# Patient Record
Sex: Female | Born: 1960 | Race: White | Hispanic: No | Marital: Single | Smoking: Current every day smoker
Health system: Southern US, Community
[De-identification: ages and names within clinical notes are randomized; demographics above are authoritative.]

## PROBLEM LIST (undated history)

## (undated) DIAGNOSIS — IMO0002 Reserved for concepts with insufficient information to code with codable children: Secondary | ICD-10-CM

## (undated) DIAGNOSIS — K219 Gastro-esophageal reflux disease without esophagitis: Secondary | ICD-10-CM

## (undated) DIAGNOSIS — M549 Dorsalgia, unspecified: Secondary | ICD-10-CM

## (undated) DIAGNOSIS — W5501XA Bitten by cat, initial encounter: Secondary | ICD-10-CM

## (undated) DIAGNOSIS — M25471 Effusion, right ankle: Secondary | ICD-10-CM

## (undated) DIAGNOSIS — M329 Systemic lupus erythematosus, unspecified: Secondary | ICD-10-CM

## (undated) DIAGNOSIS — F419 Anxiety disorder, unspecified: Secondary | ICD-10-CM

## (undated) DIAGNOSIS — N151 Renal and perinephric abscess: Secondary | ICD-10-CM

## (undated) DIAGNOSIS — M7130 Other bursal cyst, unspecified site: Secondary | ICD-10-CM

## (undated) DIAGNOSIS — M069 Rheumatoid arthritis, unspecified: Secondary | ICD-10-CM

## (undated) DIAGNOSIS — M5430 Sciatica, unspecified side: Secondary | ICD-10-CM

## (undated) DIAGNOSIS — IMO0001 Reserved for inherently not codable concepts without codable children: Secondary | ICD-10-CM

## (undated) HISTORY — DX: Dorsalgia, unspecified: M54.9

## (undated) HISTORY — DX: Effusion, right ankle: M25.471

## (undated) HISTORY — PX: OTHER SURGICAL HISTORY: SHX169

## (undated) HISTORY — DX: Bitten by cat, initial encounter: W55.01XA

## (undated) HISTORY — PX: APPENDECTOMY: SHX54

## (undated) HISTORY — PX: HERNIA REPAIR: SHX51

## (undated) HISTORY — DX: Sciatica, unspecified side: M54.30

## (undated) HISTORY — PX: LUMBAR FUSION: SHX111

---

## 2009-02-10 ENCOUNTER — Encounter: Admission: RE | Admit: 2009-02-10 | Discharge: 2009-02-10 | Payer: Self-pay | Admitting: Rheumatology

## 2009-11-27 ENCOUNTER — Ambulatory Visit (HOSPITAL_COMMUNITY)
Admission: RE | Admit: 2009-11-27 | Discharge: 2009-11-27 | Payer: Self-pay | Source: Home / Self Care | Admitting: General Surgery

## 2010-06-03 LAB — CBC
HCT: 40.1 % (ref 36.0–46.0)
Hemoglobin: 13.5 g/dL (ref 12.0–15.0)
MCH: 33.2 pg (ref 26.0–34.0)
MCHC: 33.6 g/dL (ref 30.0–36.0)
MCV: 98.7 fL (ref 78.0–100.0)
Platelets: 369 10*3/uL (ref 150–400)
RBC: 4.07 MIL/uL (ref 3.87–5.11)
RDW: 16.6 % — ABNORMAL HIGH (ref 11.5–15.5)
WBC: 12.5 10*3/uL — ABNORMAL HIGH (ref 4.0–10.5)

## 2010-06-03 LAB — SURGICAL PCR SCREEN
MRSA, PCR: NEGATIVE
Staphylococcus aureus: POSITIVE — AB

## 2010-06-03 LAB — BASIC METABOLIC PANEL
BUN: 13 mg/dL (ref 6–23)
CO2: 27 mEq/L (ref 19–32)
Chloride: 103 mEq/L (ref 96–112)
GFR calc non Af Amer: >60 mL/min (ref >60)
Glucose, Bld: 104 mg/dL — ABNORMAL HIGH (ref 70–99)
Potassium: 4.5 mEq/L (ref 3.5–5.1)
Sodium: 138 mEq/L (ref 135–145)

## 2014-08-20 ENCOUNTER — Inpatient Hospital Stay (HOSPITAL_COMMUNITY)
Admission: EM | Admit: 2014-08-20 | Discharge: 2014-08-29 | DRG: 853 | Disposition: A | Discharge status: Skilled Nursing Facility | Payer: Medicare Other | Attending: Internal Medicine | Admitting: Internal Medicine

## 2014-08-20 ENCOUNTER — Emergency Department (HOSPITAL_COMMUNITY): Payer: Medicare Other

## 2014-08-20 ENCOUNTER — Encounter (HOSPITAL_COMMUNITY): Payer: Self-pay | Admitting: Cardiology

## 2014-08-20 ENCOUNTER — Inpatient Hospital Stay (HOSPITAL_COMMUNITY): Payer: Medicare Other

## 2014-08-20 DIAGNOSIS — R740 Nonspecific elevation of levels of transaminase and lactic acid dehydrogenase [LDH]: Secondary | ICD-10-CM | POA: Diagnosis present

## 2014-08-20 DIAGNOSIS — R7401 Elevation of levels of liver transaminase levels: Secondary | ICD-10-CM

## 2014-08-20 DIAGNOSIS — M329 Systemic lupus erythematosus, unspecified: Secondary | ICD-10-CM | POA: Diagnosis present

## 2014-08-20 DIAGNOSIS — Z7952 Long term (current) use of systemic steroids: Secondary | ICD-10-CM | POA: Diagnosis not present

## 2014-08-20 DIAGNOSIS — F1020 Alcohol dependence, uncomplicated: Secondary | ICD-10-CM | POA: Diagnosis present

## 2014-08-20 DIAGNOSIS — K6812 Psoas muscle abscess: Secondary | ICD-10-CM | POA: Insufficient documentation

## 2014-08-20 DIAGNOSIS — N136 Pyonephrosis: Secondary | ICD-10-CM | POA: Diagnosis present

## 2014-08-20 DIAGNOSIS — B9689 Other specified bacterial agents as the cause of diseases classified elsewhere: Secondary | ICD-10-CM | POA: Diagnosis not present

## 2014-08-20 DIAGNOSIS — E876 Hypokalemia: Secondary | ICD-10-CM | POA: Diagnosis not present

## 2014-08-20 DIAGNOSIS — N151 Renal and perinephric abscess: Secondary | ICD-10-CM

## 2014-08-20 DIAGNOSIS — R1011 Right upper quadrant pain: Secondary | ICD-10-CM

## 2014-08-20 DIAGNOSIS — D649 Anemia, unspecified: Secondary | ICD-10-CM | POA: Diagnosis not present

## 2014-08-20 DIAGNOSIS — F17210 Nicotine dependence, cigarettes, uncomplicated: Secondary | ICD-10-CM | POA: Diagnosis present

## 2014-08-20 DIAGNOSIS — M545 Low back pain: Secondary | ICD-10-CM | POA: Diagnosis not present

## 2014-08-20 DIAGNOSIS — B9561 Methicillin susceptible Staphylococcus aureus infection as the cause of diseases classified elsewhere: Secondary | ICD-10-CM | POA: Diagnosis present

## 2014-08-20 DIAGNOSIS — I340 Nonrheumatic mitral (valve) insufficiency: Secondary | ICD-10-CM | POA: Diagnosis not present

## 2014-08-20 DIAGNOSIS — R0781 Pleurodynia: Secondary | ICD-10-CM | POA: Diagnosis not present

## 2014-08-20 DIAGNOSIS — K700 Alcoholic fatty liver: Secondary | ICD-10-CM | POA: Diagnosis present

## 2014-08-20 DIAGNOSIS — IMO0002 Reserved for concepts with insufficient information to code with codable children: Secondary | ICD-10-CM

## 2014-08-20 DIAGNOSIS — Z79891 Long term (current) use of opiate analgesic: Secondary | ICD-10-CM

## 2014-08-20 DIAGNOSIS — R52 Pain, unspecified: Secondary | ICD-10-CM

## 2014-08-20 DIAGNOSIS — R7881 Bacteremia: Secondary | ICD-10-CM | POA: Diagnosis not present

## 2014-08-20 DIAGNOSIS — A4101 Sepsis due to Methicillin susceptible Staphylococcus aureus: Principal | ICD-10-CM | POA: Insufficient documentation

## 2014-08-20 DIAGNOSIS — Z113 Encounter for screening for infections with a predominantly sexual mode of transmission: Secondary | ICD-10-CM | POA: Insufficient documentation

## 2014-08-20 DIAGNOSIS — R109 Unspecified abdominal pain: Secondary | ICD-10-CM

## 2014-08-20 DIAGNOSIS — L0291 Cutaneous abscess, unspecified: Secondary | ICD-10-CM

## 2014-08-20 DIAGNOSIS — K219 Gastro-esophageal reflux disease without esophagitis: Secondary | ICD-10-CM

## 2014-08-20 DIAGNOSIS — N12 Tubulo-interstitial nephritis, not specified as acute or chronic: Secondary | ICD-10-CM | POA: Diagnosis not present

## 2014-08-20 DIAGNOSIS — M069 Rheumatoid arthritis, unspecified: Secondary | ICD-10-CM

## 2014-08-20 DIAGNOSIS — F1010 Alcohol abuse, uncomplicated: Secondary | ICD-10-CM | POA: Diagnosis not present

## 2014-08-20 DIAGNOSIS — A499 Bacterial infection, unspecified: Secondary | ICD-10-CM | POA: Diagnosis not present

## 2014-08-20 DIAGNOSIS — N1339 Other hydronephrosis: Secondary | ICD-10-CM | POA: Diagnosis not present

## 2014-08-20 DIAGNOSIS — N1330 Unspecified hydronephrosis: Secondary | ICD-10-CM | POA: Diagnosis not present

## 2014-08-20 DIAGNOSIS — Z79899 Other long term (current) drug therapy: Secondary | ICD-10-CM | POA: Diagnosis not present

## 2014-08-20 DIAGNOSIS — R079 Chest pain, unspecified: Secondary | ICD-10-CM | POA: Diagnosis not present

## 2014-08-20 DIAGNOSIS — M549 Dorsalgia, unspecified: Secondary | ICD-10-CM

## 2014-08-20 DIAGNOSIS — F419 Anxiety disorder, unspecified: Secondary | ICD-10-CM | POA: Diagnosis present

## 2014-08-20 DIAGNOSIS — N135 Crossing vessel and stricture of ureter without hydronephrosis: Secondary | ICD-10-CM | POA: Insufficient documentation

## 2014-08-20 HISTORY — DX: Renal and perinephric abscess: N15.1

## 2014-08-20 HISTORY — DX: Systemic lupus erythematosus, unspecified: M32.9

## 2014-08-20 HISTORY — DX: Reserved for inherently not codable concepts without codable children: IMO0001

## 2014-08-20 HISTORY — PX: PERIPHERALLY INSERTED CENTRAL CATHETER INSERTION: SHX2221

## 2014-08-20 HISTORY — DX: Reserved for concepts with insufficient information to code with codable children: IMO0002

## 2014-08-20 HISTORY — DX: Rheumatoid arthritis, unspecified: M06.9

## 2014-08-20 LAB — COMPREHENSIVE METABOLIC PANEL
ALT: 56 U/L — ABNORMAL HIGH (ref 14–54)
AST: 96 U/L — ABNORMAL HIGH (ref 15–41)
Albumin: 2.9 g/dL — ABNORMAL LOW (ref 3.5–5.0)
Alkaline Phosphatase: 246 U/L — ABNORMAL HIGH (ref 38–126)
Anion gap: 11 (ref 5–15)
BUN: 19 mg/dL (ref 6–20)
CO2: 26 mmol/L (ref 22–32)
Calcium: 9 mg/dL (ref 8.9–10.3)
Chloride: 99 mmol/L — ABNORMAL LOW (ref 101–111)
Creatinine, Ser: 0.85 mg/dL (ref 0.44–1.00)
GFR calc Af Amer: >60 mL/min (ref >60)
GFR calc non Af Amer: >60 mL/min (ref >60)
Glucose, Bld: 108 mg/dL — ABNORMAL HIGH (ref 65–99)
Potassium: 3.8 mmol/L (ref 3.5–5.1)
Sodium: 136 mmol/L (ref 135–145)
Total Bilirubin: 0.8 mg/dL (ref 0.3–1.2)
Total Protein: 7.3 g/dL (ref 6.5–8.1)

## 2014-08-20 LAB — CBC WITH DIFFERENTIAL/PLATELET
BASOS ABS: 0 10*3/uL (ref 0.0–0.1)
Basophils Relative: 0 % (ref 0–1)
EOS PCT: 0 % (ref 0–5)
Eosinophils Absolute: 0 10*3/uL (ref 0.0–0.7)
HEMATOCRIT: 38 % (ref 36.0–46.0)
HEMOGLOBIN: 12.8 g/dL (ref 12.0–15.0)
LYMPHS PCT: 8 % — AB (ref 12–46)
Lymphs Abs: 1.3 10*3/uL (ref 0.7–4.0)
MCH: 34.8 pg — AB (ref 26.0–34.0)
MCHC: 33.7 g/dL (ref 30.0–36.0)
MCV: 103.3 fL — ABNORMAL HIGH (ref 78.0–100.0)
MONO ABS: 1 10*3/uL (ref 0.1–1.0)
MONOS PCT: 6 % (ref 3–12)
NEUTROS ABS: 13.9 10*3/uL — AB (ref 1.7–7.7)
NEUTROS PCT: 86 % — AB (ref 43–77)
PLATELETS: 491 10*3/uL — AB (ref 150–400)
RBC: 3.68 MIL/uL — AB (ref 3.87–5.11)
RDW: 13.1 % (ref 11.5–15.5)
WBC: 16.3 10*3/uL — ABNORMAL HIGH (ref 4.0–10.5)

## 2014-08-20 LAB — URINALYSIS, ROUTINE W REFLEX MICROSCOPIC
GLUCOSE, UA: NEGATIVE mg/dL
KETONES UR: NEGATIVE mg/dL
Nitrite: POSITIVE — AB
PH: 6 (ref 5.0–8.0)
PROTEIN: 100 mg/dL — AB
Specific Gravity, Urine: 1.02 (ref 1.005–1.030)
Urobilinogen, UA: 2 mg/dL — ABNORMAL HIGH (ref 0.0–1.0)

## 2014-08-20 LAB — URINE MICROSCOPIC-ADD ON

## 2014-08-20 LAB — LIPASE, BLOOD: Lipase: 16 U/L — ABNORMAL LOW (ref 22–51)

## 2014-08-20 LAB — MRSA PCR SCREENING: MRSA BY PCR: POSITIVE — AB

## 2014-08-20 LAB — PROTIME-INR
INR: 1.08 (ref 0.00–1.49)
Prothrombin Time: 14.2 seconds (ref 11.6–15.2)

## 2014-08-20 LAB — ETHANOL: Alcohol, Ethyl (B): <5 mg/dL (ref <5)

## 2014-08-20 LAB — LACTIC ACID, PLASMA: Lactic Acid, Venous: 0.7 mmol/L (ref 0.5–2.0)

## 2014-08-20 MED ORDER — MUPIROCIN 2 % EX OINT
1.0000 "application " | TOPICAL_OINTMENT | Freq: Two times a day (BID) | CUTANEOUS | Status: DC
  Administered 2014-08-20 – 2014-08-25 (×10): 1 via NASAL
  Filled 2014-08-20 (×3): qty 22

## 2014-08-20 MED ORDER — ACETAMINOPHEN 325 MG PO TABS
650.0000 mg | ORAL_TABLET | Freq: Four times a day (QID) | ORAL | Status: DC | PRN
  Administered 2014-08-20 – 2014-08-21 (×3): 650 mg via ORAL
  Filled 2014-08-20 (×4): qty 2

## 2014-08-20 MED ORDER — SODIUM CHLORIDE 0.9 % IV SOLN
Freq: Once | INTRAVENOUS | Status: AC
  Administered 2014-08-20: 13:00:00 via INTRAVENOUS

## 2014-08-20 MED ORDER — PANTOPRAZOLE SODIUM 40 MG IV SOLR
40.0000 mg | Freq: Once | INTRAVENOUS | Status: AC
  Administered 2014-08-20: 40 mg via INTRAVENOUS
  Filled 2014-08-20: qty 40

## 2014-08-20 MED ORDER — IOHEXOL 300 MG/ML  SOLN: 100.0000 mL | Freq: Once | INTRAMUSCULAR | Status: AC | PRN

## 2014-08-20 MED ORDER — DEXTROSE 5 % IV SOLN: 1.0000 g | Freq: Once | INTRAVENOUS | Status: DC

## 2014-08-20 MED ORDER — DEXTROSE 5 % IV SOLN
1.0000 g | Freq: Once | INTRAVENOUS | Status: AC
  Administered 2014-08-20: 1 g via INTRAVENOUS
  Filled 2014-08-20: qty 10

## 2014-08-20 MED ORDER — SODIUM CHLORIDE 0.9 % IJ SOLN
3.0000 mL | Freq: Two times a day (BID) | INTRAMUSCULAR | Status: DC
Start: 2014-08-20 — End: 2014-08-29
  Administered 2014-08-20 – 2014-08-27 (×10): 3 mL via INTRAVENOUS

## 2014-08-20 MED ORDER — PANTOPRAZOLE SODIUM 40 MG IV SOLR
40.0000 mg | INTRAVENOUS | Status: DC
  Administered 2014-08-20: 40 mg via INTRAVENOUS
  Filled 2014-08-20: qty 40

## 2014-08-20 MED ORDER — FOLIC ACID 5 MG/ML IJ SOLN
1.0000 mg | Freq: Every day | INTRAMUSCULAR | Status: DC
  Administered 2014-08-20 – 2014-08-22 (×3): 1 mg via INTRAVENOUS
  Filled 2014-08-20 (×7): qty 0.2

## 2014-08-20 MED ORDER — SODIUM CHLORIDE 0.9 % IV BOLUS (SEPSIS)
1000.0000 mL | Freq: Once | INTRAVENOUS | Status: AC
  Administered 2014-08-20: 1000 mL via INTRAVENOUS

## 2014-08-20 MED ORDER — FENTANYL CITRATE (PF) 100 MCG/2ML IJ SOLN
INTRAMUSCULAR | Status: AC
  Administered 2014-08-20 (×2): 50 ug
  Filled 2014-08-20: qty 2

## 2014-08-20 MED ORDER — CHLORHEXIDINE GLUCONATE CLOTH 2 % EX PADS
6.0000 | MEDICATED_PAD | Freq: Every day | CUTANEOUS | Status: AC
  Administered 2014-08-21 – 2014-08-25 (×5): 6 via TOPICAL

## 2014-08-20 MED ORDER — MIDAZOLAM HCL 2 MG/2ML IJ SOLN
INTRAMUSCULAR | Status: AC
  Administered 2014-08-20: 1 mg
  Filled 2014-08-20: qty 2

## 2014-08-20 MED ORDER — IOHEXOL 300 MG/ML  SOLN
20.0000 mL | Freq: Once | INTRAMUSCULAR | Status: AC | PRN
  Administered 2014-08-20: 20 mL via INTRAVENOUS

## 2014-08-20 MED ORDER — LIDOCAINE HCL 1 % IJ SOLN
INTRAMUSCULAR | Status: AC
  Filled 2014-08-20: qty 20

## 2014-08-20 MED ORDER — ONDANSETRON HCL 4 MG PO TABS: 4.0000 mg | ORAL_TABLET | Freq: Four times a day (QID) | ORAL | Status: DC | PRN

## 2014-08-20 MED ORDER — ACETAMINOPHEN 325 MG PO TABS
650.0000 mg | ORAL_TABLET | Freq: Once | ORAL | Status: AC
  Administered 2014-08-20: 650 mg via ORAL
  Filled 2014-08-20: qty 2

## 2014-08-20 MED ORDER — SODIUM CHLORIDE 0.9 % IV SOLN
INTRAVENOUS | Status: DC
  Administered 2014-08-20 – 2014-08-28 (×9): via INTRAVENOUS

## 2014-08-20 MED ORDER — THIAMINE HCL 100 MG/ML IJ SOLN
100.0000 mg | Freq: Every day | INTRAMUSCULAR | Status: DC
  Administered 2014-08-20 – 2014-08-22 (×3): 100 mg via INTRAVENOUS
  Filled 2014-08-20 (×3): qty 2

## 2014-08-20 MED ORDER — PREDNISONE 2.5 MG PO TABS
2.5000 mg | ORAL_TABLET | Freq: Two times a day (BID) | ORAL | Status: DC
  Administered 2014-08-22 – 2014-08-29 (×15): 2.5 mg via ORAL
  Filled 2014-08-20 (×3): qty 1
  Filled 2014-08-20: qty 0.5
  Filled 2014-08-20 (×16): qty 1
  Filled 2014-08-20: qty 0.5

## 2014-08-20 MED ORDER — ACETAMINOPHEN 650 MG RE SUPP
650.0000 mg | Freq: Four times a day (QID) | RECTAL | Status: DC | PRN
Start: 2014-08-20 — End: 2014-08-24

## 2014-08-20 MED ORDER — ONDANSETRON HCL 4 MG/2ML IJ SOLN
4.0000 mg | Freq: Four times a day (QID) | INTRAMUSCULAR | Status: DC | PRN
  Administered 2014-08-26: 4 mg via INTRAVENOUS
  Filled 2014-08-20: qty 2

## 2014-08-20 MED ORDER — CEFAZOLIN SODIUM-DEXTROSE 2-3 GM-% IV SOLR
INTRAVENOUS | Status: AC
  Administered 2014-08-20: 2000 mg
  Filled 2014-08-20: qty 50

## 2014-08-20 MED ORDER — ONDANSETRON HCL 4 MG/2ML IJ SOLN
4.0000 mg | Freq: Once | INTRAMUSCULAR | Status: AC
  Administered 2014-08-20: 4 mg via INTRAVENOUS
  Filled 2014-08-20: qty 2

## 2014-08-20 MED ORDER — FOLIC ACID 1 MG PO TABS: 1.0000 mg | ORAL_TABLET | Freq: Every day | ORAL | Status: DC

## 2014-08-20 MED ORDER — FENTANYL CITRATE (PF) 100 MCG/2ML IJ SOLN
INTRAMUSCULAR | Status: AC
  Filled 2014-08-20: qty 2

## 2014-08-20 MED ORDER — HYDROMORPHONE HCL 1 MG/ML IJ SOLN
1.0000 mg | INTRAMUSCULAR | Status: AC | PRN
  Administered 2014-08-20 (×2): 1 mg via INTRAVENOUS
  Filled 2014-08-20 (×2): qty 1

## 2014-08-20 MED ORDER — SODIUM CHLORIDE 0.9 % IV BOLUS (SEPSIS): 1000.0000 mL | Freq: Once | INTRAVENOUS | Status: DC

## 2014-08-20 MED ORDER — ALPRAZOLAM 1 MG PO TABS
1.0000 mg | ORAL_TABLET | Freq: Two times a day (BID) | ORAL | Status: DC
  Administered 2014-08-20 – 2014-08-29 (×18): 1 mg via ORAL
  Filled 2014-08-20 (×2): qty 1
  Filled 2014-08-20 (×2): qty 2
  Filled 2014-08-20 (×6): qty 1
  Filled 2014-08-20: qty 2
  Filled 2014-08-20 (×8): qty 1

## 2014-08-20 MED ORDER — HEPARIN SODIUM (PORCINE) 5000 UNIT/ML IJ SOLN
5000.0000 [IU] | Freq: Three times a day (TID) | INTRAMUSCULAR | Status: DC
  Administered 2014-08-20 – 2014-08-29 (×23): 5000 [IU] via SUBCUTANEOUS
  Filled 2014-08-20 (×26): qty 1

## 2014-08-20 MED ORDER — METHOTREXATE 2.5 MG PO TABS
25.0000 mg | ORAL_TABLET | ORAL | Status: DC
  Filled 2014-08-20: qty 10

## 2014-08-20 MED ORDER — MORPHINE SULFATE 4 MG/ML IJ SOLN
4.0000 mg | INTRAMUSCULAR | Status: DC | PRN
  Administered 2014-08-21 – 2014-08-24 (×17): 4 mg via INTRAVENOUS
  Filled 2014-08-20 (×17): qty 1

## 2014-08-20 MED ORDER — METHOTREXATE 2.5 MG PO TABS: 25.0000 mg | ORAL_TABLET | ORAL | Status: DC

## 2014-08-20 NOTE — ED Notes (Signed)
Lab at bedside obtaining blood cultures.

## 2014-08-20 NOTE — ED Provider Notes (Addendum)
CSN: [id number]     Arrival date & time 08/20/14  1015 History  This chart was scribed for Rolland Porter, MD by Tanda Rockers, ED Scribe. This patient was seen in room APA15/APA15 and the patient's care was started at 11:10 AM.    Chief Complaint  Patient presents with  . Abdominal Pain   The history is provided by the patient. No language interpreter was used.     HPI Comments: Deanna Ball is a 54 y.o. female who presents to the Emergency Department complaining of right sided abdominal pain x 2 days. She also complains of nausea and vomiting. Pt admits to drinking approximately 2 pints of EtOH everyday for "awhile." Pt has not had a drink in the last 5days. The last time she went 24 hours without drinking was "months" ago. She states that she had the abdominal pain prior to quitting drinking.  Fact, she states that this is why she quit drinking.. Denies hematemesis, chest pain, back pain, diarrhea, urinary changes, or any other symptoms. Pt has pshx appendectomy.   No history of known renal disorders. Does have a history of lupus, rheumatoid arthritis. She does take methotrexate 2 times per week. No history of kidney stones.  Has not been febrile. No shakes or chills. No dysuria frequency hematuria. No hematemesis. Normal stools.   Past Medical History  Diagnosis Date  . Lupus   . Rheumatoid arthritis    Past Surgical History  Procedure Laterality Date  . Hernia repair    . Appendectomy     History reviewed. No pertinent family history. History  Substance Use Topics  . Smoking status: Current Every Day Smoker  . Smokeless tobacco: Not on file  . Alcohol Use: Yes     Comment: 1 pint -2 pints daily   OB History    No data available     Review of Systems  Constitutional: Negative for fever, chills, diaphoresis, appetite change and fatigue.  HENT: Negative for mouth sores, sore throat and trouble swallowing.   Eyes: Negative for visual disturbance.  Respiratory: Negative for  cough, chest tightness, shortness of breath and wheezing.   Cardiovascular: Negative for chest pain.  Gastrointestinal: Positive for nausea, vomiting and abdominal pain (Right sided. ). Negative for diarrhea and abdominal distention.  Endocrine: Negative for polydipsia, polyphagia and polyuria.  Genitourinary: Negative for dysuria, frequency and hematuria.  Musculoskeletal: Negative for back pain and gait problem.  Skin: Negative for color change, pallor and rash.  Neurological: Negative for dizziness, syncope, light-headedness and headaches.  Hematological: Does not bruise/bleed easily.  Psychiatric/Behavioral: Negative for behavioral problems and confusion.      Allergies  Review of patient's allergies indicates no known allergies.  Home Medications   Prior to Admission medications   Medication Sig Start Date End Date Taking? Authorizing Provider  ALPRAZolam Prudy Feeler) 1 MG tablet Take 1 mg by mouth 2 (two) times daily.  07/31/14  Yes Historical Provider, MD  HYDROcodone-acetaminophen (NORCO/VICODIN) 5-325 MG per tablet  07/31/14  Yes Historical Provider, MD  methotrexate (RHEUMATREX) 2.5 MG tablet Take 25 mg by mouth 2 (two) times a week. Caution:Chemotherapy. Protect from light.   Yes Historical Provider, MD  naproxen sodium (ANAPROX) 220 MG tablet Take 220 mg by mouth 2 (two) times daily with a meal.   Yes Historical Provider, MD  NEXIUM 40 MG capsule  07/17/14  Yes Historical Provider, MD  predniSONE (DELTASONE) 2.5 MG tablet Take 2.5 mg by mouth 2 (two) times daily with a meal.  Yes Historical Provider, MD  Vitamin D, Ergocalciferol, (DRISDOL) 50000 UNITS CAPS capsule Take 50,000 Units by mouth every 7 (seven) days.   Yes Historical Provider, MD   Triage Vitals: BP 109/87 mmHg  Pulse 102  Temp(Src) 99.4 F (37.4 C) (Oral)  Resp 18  Ht 5' 2.5" (1.588 m)  Wt 155 lb (70.308 kg)  BMI 27.88 kg/m2  SpO2 99%   Physical Exam  Constitutional: She is oriented to person, place, and  time. She appears well-developed and well-nourished. No distress.  HENT:  Head: Normocephalic.  Eyes: Conjunctivae are normal. Pupils are equal, round, and reactive to light. No scleral icterus.  Neck: Normal range of motion. Neck supple. No thyromegaly present.  Cardiovascular: Normal rate and regular rhythm.  Exam reveals no gallop and no friction rub.   No murmur heard. Pulmonary/Chest: Effort normal and breath sounds normal. No respiratory distress. She has no wheezes. She has no rales.  Abdominal: Soft. Bowel sounds are normal. She exhibits no distension. There is tenderness. There is no rebound.  Tenderness right mid abdomen and RUQ.  Liver palpably enlarged.   Musculoskeletal: Normal range of motion.  Neurological: She is alert and oriented to person, place, and time.  Skin: Skin is warm and dry. No rash noted.  Psychiatric: She has a normal mood and affect. Her behavior is normal.    ED Course  Procedures (including critical care time)  DIAGNOSTIC STUDIES: Oxygen Saturation is 99% on RA, normal by my interpretation.    COORDINATION OF CARE: 11:12 AM-Discussed treatment plan which includes IV Fluids, LFTs with pt at bedside and pt agreed to plan.   Labs Review Labs Reviewed  CBC WITH DIFFERENTIAL/PLATELET - Abnormal; Notable for the following:    WBC 16.3 (*)    RBC 3.68 (*)    MCV 103.3 (*)    MCH 34.8 (*)    Platelets 491 (*)    Neutrophils Relative % 86 (*)    Neutro Abs 13.9 (*)    Lymphocytes Relative 8 (*)    All other components within normal limits  COMPREHENSIVE METABOLIC PANEL - Abnormal; Notable for the following:    Chloride 99 (*)    Glucose, Bld 108 (*)    Albumin 2.9 (*)    AST 96 (*)    ALT 56 (*)    Alkaline Phosphatase 246 (*)    All other components within normal limits  LIPASE, BLOOD - Abnormal; Notable for the following:    Lipase 16 (*)    All other components within normal limits  ETHANOL  URINALYSIS, ROUTINE W REFLEX MICROSCOPIC (NOT AT  [facility])    Imaging Review Ct Abdomen Pelvis W Contrast  08/20/2014   CLINICAL DATA:  Right upper quadrant abdominal pain for 1 week with nausea and vomiting. History of appendectomy, hernia repair and systemic lupus erythematosus. Moderate leukocytosis. Initial encounter.  EXAM: CT ABDOMEN AND PELVIS WITH CONTRAST  TECHNIQUE: Multidetector CT imaging of the abdomen and pelvis was performed using the standard protocol following bolus administration of intravenous contrast.  CONTRAST:  100 ml Omnipaque 300  COMPARISON:  Ultrasound same date.  FINDINGS: Minimal dependent atelectasis at both lung bases. Otherwise clear. No pleural or pericardial effusion.  Lower chest: Mildly decreased hepatic density suspicious for steatosis. No focal lesions identified.  Hepatobiliary: The liver is normal in density without focal abnormality. No evidence of gallstones, gallbladder wall thickening or biliary dilatation.  Pancreas: Unremarkable. No pancreatic ductal dilatation or surrounding inflammatory changes.  Spleen: Normal in  size without focal abnormality.  Adrenals/Urinary Tract: Both adrenal glands appear normal.The left kidney appears normal. The right kidney demonstrates hydronephrosis and delayed contrast excretion. There is asymmetric perinephric soft tissue stranding on the right. There is a complex fluid collection or centrally necrotic mass adjacent to the ureteropelvic junction, measuring 5.6 x 3.2 cm transverse on image 46. This extends into the right psoas muscle. No urinary tract calculi demonstrated. The distal ureters and bladder appear unremarkable.  Stomach/Bowel: No evidence of bowel wall thickening, distention or surrounding inflammatory change.Mild sigmoid colon diverticulosis without surrounding inflammation.  Vascular/Lymphatic: There are several mildly prominent retroperitoneal lymph nodes, likely reactive. No mesenteric adenopathy. Mild aortoiliac atherosclerosis. The IVC and renal veins appear patent.   Reproductive: The uterus appears unremarkable.  No adnexal mass.  Other: No evidence of abdominal wall mass or hernia.  Musculoskeletal: No acute or significant osseous findings. Mild lumbar spondylosis noted. There are postsurgical changes at the lumbosacral junction with a grade 1 anterolisthesis. Moderate facet hypertrophy noted at L4-5. The paraspinal fat planes are maintained. No evidence of discitis or osteomyelitis P  IMPRESSION: 1. Suspected abscess within the right retroperitoneum involving the psoas muscle and obstructing the right ureteral pelvic junction. This is an atypical location, and necrotic neoplasm cannot be completely excluded. No other cause for ureteral obstruction identified. 2. Small retroperitoneal lymph nodes are probably reactive. No other suspicious masses identified. 3. No evidence of urinary tract calculus. 4. Urology consultation recommended. Results were discussed by telephone with Dr. Fayrene Fearing.   Electronically Signed   By: Carey Bullocks M.D.   On: 08/20/2014 14:38   US Abdomen Limited Ruq  08/20/2014   CLINICAL DATA:  Sharp RIGHT upper quadrant pain for 1 week, history lupus, rheumatoid arthritis  EXAM: US ABDOMEN LIMITED - RIGHT UPPER QUADRANT  COMPARISON:  None  FINDINGS: Gallbladder:  Minimal dependent sludge in gallbladder. No shadowing gallstones, gallbladder wall thickening or pericholecystic fluid.  Common bile duct:  Diameter: Normal caliber 3 mm diameter  Liver:  Echogenic, likely fatty infiltration, though this can be seen with cirrhosis and certain infiltrative disorders. No focal hepatic mass or nodularity. Hepatopetal portal venous flow.  No RIGHT upper quadrant free fluid.  IMPRESSION: Probable fatty infiltration of liver as above.  Minimal gallbladder sludge.  Otherwise negative exam.   Electronically Signed   By: Ulyses Southward M.D.   On: 08/20/2014 13:04     EKG Interpretation None      MDM   Final diagnoses:  RUQ pain  AP (abdominal pain)    Labs show  leukocytosis of 16,000. Slight elevation of alkaline phosphatase, ALT, AST. Ultrasound shows fatty infiltration but no other acute liver abnormalities. CT scan shows perinephric inferior mass against this so as inflammatory versus neoplastic. Per radiologist likely inflammatory or infectious. Although atypical location, thought to be perinephric abscess. Also hydronephrosis with obstruction of ureter just inferior to the right renal pelvis. Urine is still pending. On recheck patient has temp 102. Given by mouth Tylenol otherwise has been nothing by mouth. IV Rocephin, blood in urine cultures. Urological consult.  Discuss with urology, Dr. Alfredo Martinez.  He returned my call immediately. We discussed the case and lab and radial graphic findings. Patient will need decompression of her kidney, as well as previous drainage of her abscess. He felt that after consultation choices would be interventional radiology drainage of the abscess, as well as either one percutaneous nephrostomy, or ureteral stent placed operatively. Patient is immune compromised on the tracks 8. Is  febrile and tachycardic although not septic or hypotensive. I requested [facility] bed. Consulted [facility] hospitalist.     Rolland Porter, MD 08/20/14 1455  Rolland Porter, MD 08/20/14 1610  Rolland Porter, MD 08/20/14 1524

## 2014-08-20 NOTE — Procedures (Signed)
Interventional Radiology Procedure Note  Procedure: 1.) Placement 63F RIGHT perc nephrostomy tube.  --> bag drainage 2.) Placement 31F drain into RIGHT psoas/perinephric abscess with aspiration of 20 mL thick purulent material.  Sample sent for Cx. --> JP bulb.   Complications: None  Estimated Blood Loss: 0  Recommendations: - Routine drain care.  - Flush psoas drain (one with JP bulb) TID with 10 mL - No need to flush neph tube - Follow cultures  Signed,  Sterling Big, MD

## 2014-08-20 NOTE — Progress Notes (Signed)
CRITICAL VALUE ALERT  Critical value received:  +  MRSA PCR  Date of notification:  08/20/14  Time of notification:  2320  Critical value read back:Yes.    Nurse who received alert:  S. Corry Storie,RN  MD notified (1st page):  Standing orders intiated  Time of first page:    MD notified (2nd page):  Time of second page:  Responding MD:    Time MD responded:

## 2014-08-20 NOTE — H&P (Signed)
[facility] History and Physical  Deanna Ball NLG:[id number] DOB: 05/09/1960 DOA: 08/20/2014  Referring physician: Dr. Fayrene Fearing - [facility]  PCP: No primary care provider on file.   Chief Complaint: R side pain.   HPI: Deanna Ball is a 54 y.o. female  7 days R side abd pain. Acutely worsened 2 days ago. Started as a pinch but now feels like a "vice." Pain is now constant. Associated with nausea and vomiting. Emesis typically after meals and is nonbloody nonbilious. Denies fevers, diarrhea, hematochezia, dysuria, frequency. She's not tried anything for her symptoms. Of note patient has not taken her home medications for the last 2 days due to the pain. Patient drinks 2 pints of wine daily for the last several years but states she has not had an alcoholic beverage in 4 days.   Review of Systems:  Constitutional:  No weight loss, night sweats, Fevers, chills HEENT:  No headaches, Difficulty swallowing,Tooth/dental problems,Sore throat,  No sneezing, itching, ear ache, nasal congestion, post nasal drip,  Cardio-vascular:  No chest pain, Orthopnea, PND, swelling in lower extremities, anasarca, dizziness, palpitations  GI: Per HPI Resp:   No shortness of breath with exertion or at rest. No excess mucus, no productive cough, No non-productive cough, No coughing up of blood.No change in color of mucus.No wheezing.No chest wall deformity  Skin:  no rash or lesions.  GU:  no dysuria, change in color of urine, no urgency or frequency. Musculoskeletal:   No joint pain or swelling. No decreased range of motion. No back pain.  Psych:  No change in mood or affect. No depression or anxiety. No memory loss.   Past Medical History  Diagnosis Date  . Lupus   . Rheumatoid arthritis    Past Surgical History  Procedure Laterality Date  . Hernia repair    . Appendectomy     Social History:  reports that she has been smoking Cigarettes.  She has been smoking about 0.50 packs per day. She  does not have any smokeless tobacco history on file. She reports that she drinks alcohol. She reports that she does not use illicit drugs.  No Known Allergies  Family History  Problem Relation Age of Onset  . Lupus Maternal Uncle      Prior to Admission medications   Medication Sig Start Date End Date Taking? Authorizing Provider  ALPRAZolam Prudy Feeler) 1 MG tablet Take 1 mg by mouth 2 (two) times daily.  07/31/14  Yes Historical Provider, MD  HYDROcodone-acetaminophen (NORCO/VICODIN) 5-325 MG per tablet  07/31/14  Yes Historical Provider, MD  methotrexate (RHEUMATREX) 2.5 MG tablet Take 25 mg by mouth 2 (two) times a week. Caution:Chemotherapy. Protect from light.   Yes Historical Provider, MD  naproxen sodium (ANAPROX) 220 MG tablet Take 220 mg by mouth 2 (two) times daily with a meal.   Yes Historical Provider, MD  NEXIUM 40 MG capsule  07/17/14  Yes Historical Provider, MD  predniSONE (DELTASONE) 2.5 MG tablet Take 2.5 mg by mouth 2 (two) times daily with a meal.   Yes Historical Provider, MD  Vitamin D, Ergocalciferol, (DRISDOL) 50000 UNITS CAPS capsule Take 50,000 Units by mouth every 7 (seven) days.   Yes Historical Provider, MD   Physical Exam: Filed Vitals:   08/20/14 1030 08/20/14 1458 08/20/14 1530 08/20/14 1615  BP: 109/87 121/56 112/47   Pulse: 102 99 102   Temp: 99.4 F (37.4 C) 102.7 F (39.3 C) 102.7 F (39.3 C) 98.9 F (37.2 C)  TempSrc:  Oral Oral Oral Oral  Resp: 18 14 16    Height: 5' 2.5" (1.588 m)     Weight: 70.308 kg (155 lb)     SpO2: 99% 94% 92%     Wt Readings from Last 3 Encounters:  08/20/14 70.308 kg (155 lb)    General:  Appears calm and comfortable Eyes:  PERRL, normal lids, irises & conjunctiva ENT: Dry MM Neck:  no LAD, masses or thyromegaly Cardiovascular:  RRR, no m/r/g. Trace LE edema bilat Telemetry:  SR, no arrhythmias  Respiratory:  CTA bilaterally, no w/r/r. Normal respiratory effort. Abdomen: RUQ ttp and RCVA. Mild distention and  NABS Skin:  no rash or induration seen on limited exam Musculoskeletal:  grossly normal tone BUE/BLE Psychiatric:  grossly normal mood and affect, speech fluent and appropriate Neurologic:  grossly non-focal.          Labs on Admission:  Basic Metabolic Panel:  Recent Labs Lab 08/20/14 1140  NA 136  K 3.8  CL 99*  CO2 26  GLUCOSE 108*  BUN 19  CREATININE 0.85  CALCIUM 9.0   Liver Function Tests:  Recent Labs Lab 08/20/14 1140  AST 96*  ALT 56*  ALKPHOS 246*  BILITOT 0.8  PROT 7.3  ALBUMIN 2.9*    Recent Labs Lab 08/20/14 1132  LIPASE 16*   No results for input(s): AMMONIA in the last 168 hours. CBC:  Recent Labs Lab 08/20/14 1140  WBC 16.3*  NEUTROABS 13.9*  HGB 12.8  HCT 38.0  MCV 103.3*  PLT 491*   Cardiac Enzymes: No results for input(s): CKTOTAL, CKMB, CKMBINDEX, TROPONINI in the last 168 hours.  BNP (last 3 results) No results for input(s): BNP in the last 8760 hours.  ProBNP (last 3 results) No results for input(s): PROBNP in the last 8760 hours.  CBG: No results for input(s): GLUCAP in the last 168 hours.  Radiological Exams on Admission: Ct Abdomen Pelvis W Contrast  08/20/2014   CLINICAL DATA:  Right upper quadrant abdominal pain for 1 week with nausea and vomiting. History of appendectomy, hernia repair and systemic lupus erythematosus. Moderate leukocytosis. Initial encounter.  EXAM: CT ABDOMEN AND PELVIS WITH CONTRAST  TECHNIQUE: Multidetector CT imaging of the abdomen and pelvis was performed using the standard protocol following bolus administration of intravenous contrast.  CONTRAST:  100 ml Omnipaque 300  COMPARISON:  Ultrasound same date.  FINDINGS: Minimal dependent atelectasis at both lung bases. Otherwise clear. No pleural or pericardial effusion.  Lower chest: Mildly decreased hepatic density suspicious for steatosis. No focal lesions identified.  Hepatobiliary: The liver is normal in density without focal abnormality. No  evidence of gallstones, gallbladder wall thickening or biliary dilatation.  Pancreas: Unremarkable. No pancreatic ductal dilatation or surrounding inflammatory changes.  Spleen: Normal in size without focal abnormality.  Adrenals/Urinary Tract: Both adrenal glands appear normal.The left kidney appears normal. The right kidney demonstrates hydronephrosis and delayed contrast excretion. There is asymmetric perinephric soft tissue stranding on the right. There is a complex fluid collection or centrally necrotic mass adjacent to the ureteropelvic junction, measuring 5.6 x 3.2 cm transverse on image 46. This extends into the right psoas muscle. No urinary tract calculi demonstrated. The distal ureters and bladder appear unremarkable.  Stomach/Bowel: No evidence of bowel wall thickening, distention or surrounding inflammatory change.Mild sigmoid colon diverticulosis without surrounding inflammation.  Vascular/Lymphatic: There are several mildly prominent retroperitoneal lymph nodes, likely reactive. No mesenteric adenopathy. Mild aortoiliac atherosclerosis. The IVC and renal veins appear patent.  Reproductive: The  uterus appears unremarkable.  No adnexal mass.  Other: No evidence of abdominal wall mass or hernia.  Musculoskeletal: No acute or significant osseous findings. Mild lumbar spondylosis noted. There are postsurgical changes at the lumbosacral junction with a grade 1 anterolisthesis. Moderate facet hypertrophy noted at L4-5. The paraspinal fat planes are maintained. No evidence of discitis or osteomyelitis P  IMPRESSION: 1. Suspected abscess within the right retroperitoneum involving the psoas muscle and obstructing the right ureteral pelvic junction. This is an atypical location, and necrotic neoplasm cannot be completely excluded. No other cause for ureteral obstruction identified. 2. Small retroperitoneal lymph nodes are probably reactive. No other suspicious masses identified. 3. No evidence of urinary tract  calculus. 4. Urology consultation recommended. Results were discussed by telephone with Dr. Fayrene Fearing.   Electronically Signed   By: Carey Bullocks M.D.   On: 08/20/2014 14:38   US Abdomen Limited Ruq  08/20/2014   CLINICAL DATA:  Sharp RIGHT upper quadrant pain for 1 week, history lupus, rheumatoid arthritis  EXAM: US ABDOMEN LIMITED - RIGHT UPPER QUADRANT  COMPARISON:  None  FINDINGS: Gallbladder:  Minimal dependent sludge in gallbladder. No shadowing gallstones, gallbladder wall thickening or pericholecystic fluid.  Common bile duct:  Diameter: Normal caliber 3 mm diameter  Liver:  Echogenic, likely fatty infiltration, though this can be seen with cirrhosis and certain infiltrative disorders. No focal hepatic mass or nodularity. Hepatopetal portal venous flow.  No RIGHT upper quadrant free fluid.  IMPRESSION: Probable fatty infiltration of liver as above.  Minimal gallbladder sludge.  Otherwise negative exam.   Electronically Signed   By: Ulyses Southward M.D.   On: 08/20/2014 13:04     Assessment/Plan Principal Problem:   Perinephric abscess Active Problems:   Pyelonephritis   Hydronephrosis   Lupus   Rheumatoid arthritis   Anxiety   GERD without esophagitis   Alcohol abuse   Transaminitis  Perinephric abscess: Noted on CT with associated hydronephrosis. Urology consultation by[facility] ED requesting transfer of patient to [facility] long for further management. WBC 16.3, febrile, vital signs stable. UA showing nitrites, moderate leukocytes, WBC TNTC,  - Stepdown at [facility] long - Consult urology upon arrival (Dr McDiarmid initially consulted) - Urology will potentially work with IR for possible drainage and placement of nephrostomy tube per their direction. - Nothing by mouth - Continue ceftriaxone - Follow-up urine culture - Follow-up blood culture - Lactic acid - NS IVF  Lupus/Rheumatoid arthritis: Conditions at baseline. Pt immune compromised.  - continue Methotrexate and Prednisone  GERD: -  continue PPI as IV  Anxiety: - continue Xanax  Alcohol abuse: Fatty liver noted in scans. Mild transaminitis on admission and elevated alkaline phosphatase. Last alcoholic beverage 4 days ago per patient. Typically drinks 2 pints of wine per day as a baseline. No acute evidence of withdrawal. EtOH level less than 5 on admission - CIWA - Folate, Thiamine, MV  Code Status: FULL DVT Prophylaxis: Hep Family Communication: None Disposition Plan: Pending transfer and clinical improvement  MERRELL, DAVID Shela Commons, MD Family Medicine [facility] [email or web address] Password [email or web address]

## 2014-08-20 NOTE — ED Notes (Signed)
[facility] en route.  °

## 2014-08-20 NOTE — ED Notes (Signed)
hospitalist at bedside

## 2014-08-20 NOTE — Consult Note (Signed)
Urology Consult  Referring physician: Alyson Locket Reason for referral: Hydronephrosis with abscess  Chief Complaint: Hydronephrosis and perinephric abcess  History of Present Illness: Call by ER at [facility]; rt sided pain for two days; assoc n/v; alcohol history significant; on methotrexate; developed fever in ER; WBC 16.3; CT scan noted abscess with hydro; given rocephin; u/a pos nitrites and sent with blood for c/s; Cr .85; LFT elevated mildly;  No stone hx; no GU surgery hx; no UTI hx No IV drug hx Still rt upper quadrant and flank pain and nausea Fever broke Modifying factors: There are no other modifying factors  Associated signs and symptoms: There are no other associated signs and symptoms Aggravating and relieving factors: There are no other aggravating or relieving factors Severity: Moderate Duration: Persistent  Past Medical History  Diagnosis Date  . Lupus   . Rheumatoid arthritis    Past Surgical History  Procedure Laterality Date  . Hernia repair    . Appendectomy      Medications: I have reviewed the patient's current medications. Allergies: No Known Allergies  History reviewed. No pertinent family history. Social History:  reports that she has been smoking.  She does not have any smokeless tobacco history on file. She reports that she drinks alcohol. She reports that she does not use illicit drugs.  ROS: All systems are reviewed and negative except as noted. Rest negative  Physical Exam:  Vital signs in last 24 hours: Temp:  [99.4 F (37.4 C)-102.7 F (39.3 C)] 102.7 F (39.3 C) (06/01 1458) Pulse Rate:  [99-102] 99 (06/01 1458) Resp:  [14-18] 14 (06/01 1458) BP: (109-121)/(56-87) 121/56 mmHg (06/01 1458) SpO2:  [94 %-99 %] 94 % (06/01 1458) Weight:  [70.308 kg (155 lb)] 70.308 kg (155 lb) (06/01 1030)  Cardiovascular: Skin warm; not flushed Respiratory: Breaths quiet; no shortness of breath Abdomen: No masses Neurological: Normal sensation to  touch Musculoskeletal: Normal motor function arms and legs Lymphatics: No inguinal adenopathy Skin: No rashes Genitourinary:looks fatigued but not toxic Rt flank tender  Laboratory Data:  Results for orders placed or performed during the hospital encounter of 08/20/14 (from the past 72 hour(s))  Lipase, blood     Status: Abnormal   Collection Time: 08/20/14 11:32 AM  Result Value Ref Range   Lipase 16 (L) 22 - 51 U/L  CBC with Differential/Platelet     Status: Abnormal   Collection Time: 08/20/14 11:40 AM  Result Value Ref Range   WBC 16.3 (H) 4.0 - 10.5 K/uL   RBC 3.68 (L) 3.87 - 5.11 MIL/uL   Hemoglobin 12.8 12.0 - 15.0 g/dL   HCT 38.0 36.0 - 46.0 %   MCV 103.3 (H) 78.0 - 100.0 fL   MCH 34.8 (H) 26.0 - 34.0 pg   MCHC 33.7 30.0 - 36.0 g/dL   RDW 13.1 11.5 - 15.5 %   Platelets 491 (H) 150 - 400 K/uL   Neutrophils Relative % 86 (H) 43 - 77 %   Neutro Abs 13.9 (H) 1.7 - 7.7 K/uL   Lymphocytes Relative 8 (L) 12 - 46 %   Lymphs Abs 1.3 0.7 - 4.0 K/uL   Monocytes Relative 6 3 - 12 %   Monocytes Absolute 1.0 0.1 - 1.0 K/uL   Eosinophils Relative 0 0 - 5 %   Eosinophils Absolute 0.0 0.0 - 0.7 K/uL   Basophils Relative 0 0 - 1 %   Basophils Absolute 0.0 0.0 - 0.1 K/uL  Comprehensive metabolic panel  Status: Abnormal   Collection Time: 08/20/14 11:40 AM  Result Value Ref Range   Sodium 136 135 - 145 mmol/L   Potassium 3.8 3.5 - 5.1 mmol/L   Chloride 99 (L) 101 - 111 mmol/L   CO2 26 22 - 32 mmol/L   Glucose, Bld 108 (H) 65 - 99 mg/dL   BUN 19 6 - 20 mg/dL   Creatinine, Ser 0.85 0.44 - 1.00 mg/dL   Calcium 9.0 8.9 - 10.3 mg/dL   Total Protein 7.3 6.5 - 8.1 g/dL   Albumin 2.9 (L) 3.5 - 5.0 g/dL   AST 96 (H) 15 - 41 U/L   ALT 56 (H) 14 - 54 U/L   Alkaline Phosphatase 246 (H) 38 - 126 U/L   Total Bilirubin 0.8 0.3 - 1.2 mg/dL   GFR calc non Af Amer >60 >60 mL/min   GFR calc Af Amer >60 >60 mL/min    Comment: (NOTE) The eGFR has been calculated using the CKD EPI  equation. This calculation has not been validated in all clinical situations. eGFR's persistently <60 mL/min signify possible Chronic Kidney Disease.    Anion gap 11 5 - 15  Ethanol     Status: None   Collection Time: 08/20/14 11:40 AM  Result Value Ref Range   Alcohol, Ethyl (B) <5 <5 mg/dL    Comment:        LOWEST DETECTABLE LIMIT FOR SERUM ALCOHOL IS 11 mg/dL FOR MEDICAL PURPOSES ONLY   Urinalysis, Routine w reflex microscopic     Status: Abnormal   Collection Time: 08/20/14  1:15 PM  Result Value Ref Range   Color, Urine YELLOW YELLOW   APPearance HAZY (A) CLEAR   Specific Gravity, Urine 1.020 1.005 - 1.030   pH 6.0 5.0 - 8.0   Glucose, UA NEGATIVE NEGATIVE mg/dL   Hgb urine dipstick LARGE (A) NEGATIVE   Bilirubin Urine SMALL (A) NEGATIVE   Ketones, ur NEGATIVE NEGATIVE mg/dL   Protein, ur 100 (A) NEGATIVE mg/dL   Urobilinogen, UA 2.0 (H) 0.0 - 1.0 mg/dL   Nitrite POSITIVE (A) NEGATIVE   Leukocytes, UA MODERATE (A) NEGATIVE  Urine microscopic-add on     Status: Abnormal   Collection Time: 08/20/14  1:15 PM  Result Value Ref Range   Squamous Epithelial / LPF FEW (A) RARE   WBC, UA TOO NUMEROUS TO COUNT <3 WBC/hpf   RBC / HPF 11-20 <3 RBC/hpf   Bacteria, UA MANY (A) RARE   Urine-Other TRICHOMONAS PRESENT    No results found for this or any previous visit (from the past 240 hour(s)). Creatinine:  Recent Labs  08/20/14 1140  CREATININE 0.85    Xrays: See report/chart CT scan: normal left kidney; Rt hydro; complex fluid collection adjacent to UP jx 5x2 cm; no stones; ? Abscess from Rt retroperitoneum involving psoas muscle and secondary hydro; / necrotic tumor  Impression/Assessment:  Rt hydro and psoas abscess  Plan:  Picture was drawn; spoke with IR; will do perc drain tonight of abscess and rt kidney  Deanna Ball A 08/20/2014, 3:30 PM

## 2014-08-20 NOTE — ED Notes (Signed)
Contacted pt mother in regards to pt room assignment per patient consent.

## 2014-08-20 NOTE — ED Notes (Addendum)
Pt mother had to leave and left contact information for pt updates. Pt mother phone number: [phone].

## 2014-08-20 NOTE — ED Notes (Addendum)
Receiving RN at [facility] not available at this time. Reported would call back for report.

## 2014-08-20 NOTE — ED Notes (Addendum)
Right sided abdominal pain with vomiting times 5 days.  Drinks 1-2 pints daily,  States she hasn't had anything to drink in a few days.

## 2014-08-20 NOTE — Consult Note (Signed)
Chief Complaint: Chief Complaint  Patient presents with  . Abdominal Pain    Referring Physician(s): Alfredo Martinez, MD  History of Present Illness: Deanna Ball is a 54 y.o. female presented initially to [facility] in [place] with a 2 day history of RIGHT flank pain accompanied by n/v.  She developed fever to 102 in the ER and lab work shows significant leukocytosis with WBC 16.3.  PMH significant for EtOH abuse and SLE on chronic methotrexate.  CT scan revealed complex fluid collection in the right psoas muscle extending into the right perinephric space with resultant proximal ureteral obstruction and right hydronephrosis.   Imaging concerning for abscess with secondary hydro and possible pyonephriosis.  UA positive for nitrites and blood sent for Cx.  Rocephin given in ER with defervescence.   Urology consulted.  Given perinephric abscess, ureteral stent placement likely sub-optimal.  IR now consulted to evaluate for right perc neph drainage and perinephric abscess drain placement.    Pt continues to feel 'awful' with persistent right flank pain and nausea.    Past Medical History  Diagnosis Date  . Lupus   . Rheumatoid arthritis     Past Surgical History  Procedure Laterality Date  . Hernia repair    . Appendectomy      Allergies: Review of patient's allergies indicates no known allergies.  Medications: Prior to Admission medications   Medication Sig Start Date End Date Taking? Authorizing Provider  ALPRAZolam Prudy Feeler) 1 MG tablet Take 1 mg by mouth 2 (two) times daily.  07/31/14  Yes Historical Provider, MD  HYDROcodone-acetaminophen (NORCO/VICODIN) 5-325 MG per tablet  07/31/14  Yes Historical Provider, MD  methotrexate (RHEUMATREX) 2.5 MG tablet Take 25 mg by mouth 2 (two) times a week. Caution:Chemotherapy. Protect from light.   Yes Historical Provider, MD  naproxen sodium (ANAPROX) 220 MG tablet Take 220 mg by mouth 2 (two) times daily with a meal.    Yes Historical Provider, MD  NEXIUM 40 MG capsule  07/17/14  Yes Historical Provider, MD  predniSONE (DELTASONE) 2.5 MG tablet Take 2.5 mg by mouth 2 (two) times daily with a meal.   Yes Historical Provider, MD  Vitamin D, Ergocalciferol, (DRISDOL) 50000 UNITS CAPS capsule Take 50,000 Units by mouth every 7 (seven) days.   Yes Historical Provider, MD     Family History  Problem Relation Age of Onset  . Lupus Maternal Uncle     History   Social History  . Marital Status: Single    Spouse Name: N/A  . Number of Children: N/A  . Years of Education: N/A   Social History Main Topics  . Smoking status: Current Every Day Smoker -- 0.50 packs/day    Types: Cigarettes  . Smokeless tobacco: Not on file  . Alcohol Use: 0.0 oz/week    0 Standard drinks or equivalent per week     Comment: patient drinks 2 bottles of wild irish rose wine last drink 4 days ago   . Drug Use: No  . Sexual Activity: Yes   Other Topics Concern  . None   Social History Narrative  . None    Review of Systems: A 12 point ROS discussed and pertinent positives are indicated in the HPI above.  All other systems are negative.  Review of Systems  Vital Signs: BP 136/56 mmHg  Pulse 102  Temp(Src) 101.7 F (38.7 C) (Oral)  Resp 26  Ht 5' 2.5" (1.588 m)  Wt 157 lb 3 oz (  71.3 kg)  BMI 28.27 kg/m2  SpO2 96%  Physical Exam  Constitutional: She is oriented to person, place, and time. She appears well-developed and well-nourished. She appears ill.  HENT:  Head: Normocephalic and atraumatic.  Eyes: No scleral icterus.  Cardiovascular: Tachycardia present.   Pulmonary/Chest: Effort normal. No accessory muscle usage. No respiratory distress.  Abdominal: She exhibits no mass. There is tenderness in the right upper quadrant and right lower quadrant.  Neurological: She is alert and oriented to person, place, and time.  Skin: Skin is warm, dry and intact.  Psychiatric: She has a normal mood and affect. Her speech  is normal.    Mallampati Score:  2  Imaging: Ct Abdomen Pelvis W Contrast  08/20/2014   CLINICAL DATA:  Right upper quadrant abdominal pain for 1 week with nausea and vomiting. History of appendectomy, hernia repair and systemic lupus erythematosus. Moderate leukocytosis. Initial encounter.  EXAM: CT ABDOMEN AND PELVIS WITH CONTRAST  TECHNIQUE: Multidetector CT imaging of the abdomen and pelvis was performed using the standard protocol following bolus administration of intravenous contrast.  CONTRAST:  100 ml Omnipaque 300  COMPARISON:  Ultrasound same date.  FINDINGS: Minimal dependent atelectasis at both lung bases. Otherwise clear. No pleural or pericardial effusion.  Lower chest: Mildly decreased hepatic density suspicious for steatosis. No focal lesions identified.  Hepatobiliary: The liver is normal in density without focal abnormality. No evidence of gallstones, gallbladder wall thickening or biliary dilatation.  Pancreas: Unremarkable. No pancreatic ductal dilatation or surrounding inflammatory changes.  Spleen: Normal in size without focal abnormality.  Adrenals/Urinary Tract: Both adrenal glands appear normal.The left kidney appears normal. The right kidney demonstrates hydronephrosis and delayed contrast excretion. There is asymmetric perinephric soft tissue stranding on the right. There is a complex fluid collection or centrally necrotic mass adjacent to the ureteropelvic junction, measuring 5.6 x 3.2 cm transverse on image 46. This extends into the right psoas muscle. No urinary tract calculi demonstrated. The distal ureters and bladder appear unremarkable.  Stomach/Bowel: No evidence of bowel wall thickening, distention or surrounding inflammatory change.Mild sigmoid colon diverticulosis without surrounding inflammation.  Vascular/Lymphatic: There are several mildly prominent retroperitoneal lymph nodes, likely reactive. No mesenteric adenopathy. Mild aortoiliac atherosclerosis. The IVC and  renal veins appear patent.  Reproductive: The uterus appears unremarkable.  No adnexal mass.  Other: No evidence of abdominal wall mass or hernia.  Musculoskeletal: No acute or significant osseous findings. Mild lumbar spondylosis noted. There are postsurgical changes at the lumbosacral junction with a grade 1 anterolisthesis. Moderate facet hypertrophy noted at L4-5. The paraspinal fat planes are maintained. No evidence of discitis or osteomyelitis P  IMPRESSION: 1. Suspected abscess within the right retroperitoneum involving the psoas muscle and obstructing the right ureteral pelvic junction. This is an atypical location, and necrotic neoplasm cannot be completely excluded. No other cause for ureteral obstruction identified. 2. Small retroperitoneal lymph nodes are probably reactive. No other suspicious masses identified. 3. No evidence of urinary tract calculus. 4. Urology consultation recommended. Results were discussed by telephone with Dr. Fayrene Fearing.   Electronically Signed   By: Carey Bullocks M.D.   On: 08/20/2014 14:38   US Abdomen Limited Ruq  08/20/2014   CLINICAL DATA:  Sharp RIGHT upper quadrant pain for 1 week, history lupus, rheumatoid arthritis  EXAM: US ABDOMEN LIMITED - RIGHT UPPER QUADRANT  COMPARISON:  None  FINDINGS: Gallbladder:  Minimal dependent sludge in gallbladder. No shadowing gallstones, gallbladder wall thickening or pericholecystic fluid.  Common bile duct:  Diameter: Normal caliber 3 mm diameter  Liver:  Echogenic, likely fatty infiltration, though this can be seen with cirrhosis and certain infiltrative disorders. No focal hepatic mass or nodularity. Hepatopetal portal venous flow.  No RIGHT upper quadrant free fluid.  IMPRESSION: Probable fatty infiltration of liver as above.  Minimal gallbladder sludge.  Otherwise negative exam.   Electronically Signed   By: Ulyses Southward M.D.   On: 08/20/2014 13:04    Labs:  CBC:  Recent Labs  08/20/14 1140  WBC 16.3*  HGB 12.8  HCT 38.0    PLT 491*    COAGS:  Recent Labs  08/20/14 2005  INR 1.08    BMP:  Recent Labs  08/20/14 1140  NA 136  K 3.8  CL 99*  CO2 26  GLUCOSE 108*  BUN 19  CALCIUM 9.0  CREATININE 0.85  GFRNONAA >60  GFRAA >60    LIVER FUNCTION TESTS:  Recent Labs  08/20/14 1140  BILITOT 0.8  AST 96*  ALT 56*  ALKPHOS 246*  PROT 7.3  ALBUMIN 2.9*    TUMOR MARKERS: No results for input(s): AFPTM, CEA, CA199, CHROMGRNA in the last 8760 hours.  Assessment and Plan:  54 yo female with right perinephric/psoas abscess and resultant right proximal ureteral obstruction with suspected pyonephrosis.  Necrotic mass considered much less likely given clinical history.   Risks, benefits and alternatives including bacteremia and worsening sepsis transiently, bleeding and decrease or loss of renal function discussed.  Mrs. Gu voiced her understanding and desire to proceed.  She understands the abscess drain may be in place for days or weeks and the neph tube will remain in place for at least 6 weeks prior to removal.   - Right percutaneous nephrostomy - Right perinephric/psoas abscess drain - If abscess does not resolve as expected and necrotic soft tissue mass remains, biopsy could be considered in the future.    Thank you for this interesting consult.  I greatly enjoyed meeting Deanna Ball and look forward to participating in their care.  SignedMalachy Moan 08/20/2014, 8:34 PM

## 2014-08-21 ENCOUNTER — Inpatient Hospital Stay (HOSPITAL_COMMUNITY): Payer: Medicare Other

## 2014-08-21 DIAGNOSIS — L0291 Cutaneous abscess, unspecified: Secondary | ICD-10-CM | POA: Insufficient documentation

## 2014-08-21 DIAGNOSIS — M545 Low back pain: Secondary | ICD-10-CM

## 2014-08-21 DIAGNOSIS — M329 Systemic lupus erythematosus, unspecified: Secondary | ICD-10-CM

## 2014-08-21 DIAGNOSIS — K6812 Psoas muscle abscess: Secondary | ICD-10-CM | POA: Insufficient documentation

## 2014-08-21 DIAGNOSIS — Z113 Encounter for screening for infections with a predominantly sexual mode of transmission: Secondary | ICD-10-CM | POA: Insufficient documentation

## 2014-08-21 DIAGNOSIS — N1339 Other hydronephrosis: Secondary | ICD-10-CM

## 2014-08-21 DIAGNOSIS — N135 Crossing vessel and stricture of ureter without hydronephrosis: Secondary | ICD-10-CM | POA: Insufficient documentation

## 2014-08-21 DIAGNOSIS — F1020 Alcohol dependence, uncomplicated: Secondary | ICD-10-CM

## 2014-08-21 DIAGNOSIS — B9689 Other specified bacterial agents as the cause of diseases classified elsewhere: Secondary | ICD-10-CM

## 2014-08-21 DIAGNOSIS — A499 Bacterial infection, unspecified: Secondary | ICD-10-CM

## 2014-08-21 LAB — COMPREHENSIVE METABOLIC PANEL
ALBUMIN: 2.3 g/dL — AB (ref 3.5–5.0)
ALK PHOS: 185 U/L — AB (ref 38–126)
ALT: 35 U/L (ref 14–54)
AST: 40 U/L (ref 15–41)
Anion gap: 11 (ref 5–15)
BILIRUBIN TOTAL: 0.9 mg/dL (ref 0.3–1.2)
BUN: 11 mg/dL (ref 6–20)
CALCIUM: 8.2 mg/dL — AB (ref 8.9–10.3)
CHLORIDE: 104 mmol/L (ref 101–111)
CO2: 23 mmol/L (ref 22–32)
Creatinine, Ser: 0.65 mg/dL (ref 0.44–1.00)
GFR calc non Af Amer: >60 mL/min (ref >60)
Glucose, Bld: 93 mg/dL (ref 65–99)
POTASSIUM: 3.1 mmol/L — AB (ref 3.5–5.1)
Sodium: 138 mmol/L (ref 135–145)
TOTAL PROTEIN: 6.1 g/dL — AB (ref 6.5–8.1)

## 2014-08-21 LAB — CBC
HCT: 35.5 % — ABNORMAL LOW (ref 36.0–46.0)
Hemoglobin: 11.7 g/dL — ABNORMAL LOW (ref 12.0–15.0)
MCH: 33.9 pg (ref 26.0–34.0)
MCHC: 33 g/dL (ref 30.0–36.0)
MCV: 102.9 fL — AB (ref 78.0–100.0)
PLATELETS: 381 10*3/uL (ref 150–400)
RBC: 3.45 MIL/uL — ABNORMAL LOW (ref 3.87–5.11)
RDW: 13.1 % (ref 11.5–15.5)
WBC: 5.6 10*3/uL (ref 4.0–10.5)

## 2014-08-21 MED ORDER — PANTOPRAZOLE SODIUM 40 MG PO TBEC
40.0000 mg | DELAYED_RELEASE_TABLET | Freq: Every day | ORAL | Status: DC
  Administered 2014-08-21 – 2014-08-29 (×9): 40 mg via ORAL
  Filled 2014-08-21 (×9): qty 1

## 2014-08-21 MED ORDER — VANCOMYCIN HCL IN DEXTROSE 1-5 GM/200ML-% IV SOLN
1000.0000 mg | Freq: Two times a day (BID) | INTRAVENOUS | Status: DC
  Administered 2014-08-22 – 2014-08-23 (×3): 1000 mg via INTRAVENOUS
  Filled 2014-08-21 (×4): qty 200

## 2014-08-21 MED ORDER — SODIUM CHLORIDE 0.9 % IV SOLN
3.0000 g | Freq: Four times a day (QID) | INTRAVENOUS | Status: DC
  Administered 2014-08-21 – 2014-08-22 (×4): 3 g via INTRAVENOUS
  Filled 2014-08-21 (×6): qty 3

## 2014-08-21 MED ORDER — SODIUM CHLORIDE 0.9 % IV SOLN
1500.0000 mg | Freq: Once | INTRAVENOUS | Status: AC
  Administered 2014-08-21: 1500 mg via INTRAVENOUS
  Filled 2014-08-21: qty 1500

## 2014-08-21 NOTE — Progress Notes (Signed)
  Echocardiogram 2D Echocardiogram has been performed.  Deanna Ball 08/21/2014, 4:07 PM

## 2014-08-21 NOTE — Progress Notes (Signed)
IR successfully placed drain in abscess and rt kidney Treat with antibioitcs Will follow

## 2014-08-21 NOTE — Progress Notes (Signed)
CRITICAL VALUE ALERT  Critical value received: Blood cultures: gram + cocci in clusters  Date of notification:  08/21/14  Time of notification:  0720  Critical value read back:Yes.    Nurse who received alert:  S.Alisea Matte,RN  MD notified (1st page):    Time of first page:    MD notified (2nd page):  Time of second page:  Responding MD:    Time MD responded:

## 2014-08-21 NOTE — Consult Note (Signed)
[facility]    Date of Admission:  08/20/2014  Date of Consult:  08/21/2014  Reason for Consult: gram + bacteremia and perinephric abscess Referring Physician: Dr. Sloan Leiter   HPI: Deanna Ball is an 54 y.o. female with a history of alcoholism (drinks 2 bottles of wine daily), SLE, RA who had developed abdomeinal pain, nausea, vomiting. She was evaluated with CT scan which showed:  complex fluid collection or centrally necrotic mass adjacent to the ureteropelvic junction, measuring 5.6 x 3.2 cm transverse on image 46. This extends into the right psoas muscle and ureteral obstruction.  She underwent IR guided drain of the abscess with nephrostomy tube with return of 41m of thick purulent material.  Cultures were sent and GS showed GPC C and pairs.  Blood cultures from admission growing GPortales    Past Medical History  Diagnosis Date  . Lupus   . Rheumatoid arthritis     Past Surgical History  Procedure Laterality Date  . Hernia repair    . Appendectomy    ergies:   No Known Allergies   Medications: I have reviewed patients current medications as documented in Epic Anti-infectives    Start     Dose/Rate Route Frequency Ordered Stop   08/22/14 0200  vancomycin (VANCOCIN) IVPB 1000 mg/200 mL premix     1,000 mg 200 mL/hr over 60 Minutes Intravenous Every 12 hours 08/21/14 1213     08/21/14 1500  cefTRIAXone (ROCEPHIN) 1 g in dextrose 5 % 50 mL IVPB  Status:  Discontinued     1 g 100 mL/hr over 30 Minutes Intravenous  Once 08/20/14 1702 08/21/14 1206   08/21/14 1300  vancomycin (VANCOCIN) 1,500 mg in sodium chloride 0.9 % 500 mL IVPB     1,500 mg 250 mL/hr over 120 Minutes Intravenous  Once 08/21/14 1213     08/21/14 1230  Ampicillin-Sulbactam (UNASYN) 3 g in sodium chloride 0.9 % 100 mL IVPB     3 g 100 mL/hr over 60 Minutes Intravenous 4 times per day 08/21/14 1208     08/20/14 2033  ceFAZolin (ANCEF) 2-3 GM-% IVPB SOLR      Comments:  DBenjamin Stain  : cabinet override      08/20/14 2033 08/20/14 2042   08/20/14 1515  cefTRIAXone (ROCEPHIN) 1 g in dextrose 5 % 50 mL IVPB     1 g 100 mL/hr over 30 Minutes Intravenous  Once 08/20/14 1504 08/20/14 1613      Social History:  reports that she has been smoking Cigarettes.  She has been smoking about 0.50 packs per day. She does not have any smokeless tobacco history on file. She reports that she drinks alcohol. She reports that she does not use illicit drugs.  Family History  Problem Relation Age of Onset  . Lupus Maternal Uncle     As in HPI and primary teams notes otherwise 12 point review of systems is negative  Blood pressure 114/62, pulse 91, temperature 99.5 F (37.5 C), temperature source Oral, resp. rate 19, height 5' 2.5" (1.588 m), weight 157 lb 3 oz (71.3 kg), SpO2 91 %. General: Alert and awake, oriented x3, not in any acute distress. HEENT: EOMI, oropharynx clear and without exudate CVS tachycardic rate, normal r,  no murmur rubs or gallops Chest: clear to auscultation bilaterally, no wheezing, rales or rhonchi Abdomen: distended, diffusely tender esp LLQ + Bs Extremities:2+ edema, RA changes in hand with  Swan neck defomities  Neuro: nonfocal, strength and sensation intact   Results for orders placed or performed during the hospital encounter of 08/20/14 (from the past 48 hour(s))  Lipase, blood     Status: Abnormal   Collection Time: 08/20/14 11:32 AM  Result Value Ref Range   Lipase 16 (L) 22 - 51 U/L  CBC with Differential/Platelet     Status: Abnormal   Collection Time: 08/20/14 11:40 AM  Result Value Ref Range   WBC 16.3 (H) 4.0 - 10.5 K/uL   RBC 3.68 (L) 3.87 - 5.11 MIL/uL   Hemoglobin 12.8 12.0 - 15.0 g/dL   HCT 38.0 36.0 - 46.0 %   MCV 103.3 (H) 78.0 - 100.0 fL   MCH 34.8 (H) 26.0 - 34.0 pg   MCHC 33.7 30.0 - 36.0 g/dL   RDW 13.1 11.5 - 15.5 %   Platelets 491 (H) 150 - 400 K/uL   Neutrophils Relative % 86 (H) 43 - 77 %    Neutro Abs 13.9 (H) 1.7 - 7.7 K/uL   Lymphocytes Relative 8 (L) 12 - 46 %   Lymphs Abs 1.3 0.7 - 4.0 K/uL   Monocytes Relative 6 3 - 12 %   Monocytes Absolute 1.0 0.1 - 1.0 K/uL   Eosinophils Relative 0 0 - 5 %   Eosinophils Absolute 0.0 0.0 - 0.7 K/uL   Basophils Relative 0 0 - 1 %   Basophils Absolute 0.0 0.0 - 0.1 K/uL  Comprehensive metabolic panel     Status: Abnormal   Collection Time: 08/20/14 11:40 AM  Result Value Ref Range   Sodium 136 135 - 145 mmol/L   Potassium 3.8 3.5 - 5.1 mmol/L   Chloride 99 (L) 101 - 111 mmol/L   CO2 26 22 - 32 mmol/L   Glucose, Bld 108 (H) 65 - 99 mg/dL   BUN 19 6 - 20 mg/dL   Creatinine, Ser 0.85 0.44 - 1.00 mg/dL   Calcium 9.0 8.9 - 10.3 mg/dL   Total Protein 7.3 6.5 - 8.1 g/dL   Albumin 2.9 (L) 3.5 - 5.0 g/dL   AST 96 (H) 15 - 41 U/L   ALT 56 (H) 14 - 54 U/L   Alkaline Phosphatase 246 (H) 38 - 126 U/L   Total Bilirubin 0.8 0.3 - 1.2 mg/dL   GFR calc non Af Amer >60 >60 mL/min   GFR calc Af Amer >60 >60 mL/min    Comment: (NOTE) The eGFR has been calculated using the CKD EPI equation. This calculation has not been validated in all clinical situations. eGFR's persistently <60 mL/min signify possible Chronic Kidney Disease.    Anion gap 11 5 - 15  Ethanol     Status: None   Collection Time: 08/20/14 11:40 AM  Result Value Ref Range   Alcohol, Ethyl (B) <5 <5 mg/dL    Comment:        LOWEST DETECTABLE LIMIT FOR SERUM ALCOHOL IS 11 mg/dL FOR MEDICAL PURPOSES ONLY   Urinalysis, Routine w reflex microscopic     Status: Abnormal   Collection Time: 08/20/14  1:15 PM  Result Value Ref Range   Color, Urine YELLOW YELLOW   APPearance HAZY (A) CLEAR   Specific Gravity, Urine 1.020 1.005 - 1.030   pH 6.0 5.0 - 8.0   Glucose, UA NEGATIVE NEGATIVE mg/dL   Hgb urine dipstick LARGE (A) NEGATIVE   Bilirubin Urine SMALL (A) NEGATIVE   Ketones, ur NEGATIVE NEGATIVE mg/dL   Protein, ur 100 (  A) NEGATIVE mg/dL   Urobilinogen, UA 2.0 (H) 0.0 - 1.0  mg/dL   Nitrite POSITIVE (A) NEGATIVE   Leukocytes, UA MODERATE (A) NEGATIVE  Urine microscopic-add on     Status: Abnormal   Collection Time: 08/20/14  1:15 PM  Result Value Ref Range   Squamous Epithelial / LPF FEW (A) RARE   WBC, UA TOO NUMEROUS TO COUNT <3 WBC/hpf   RBC / HPF 11-20 <3 RBC/hpf   Bacteria, UA MANY (A) RARE   Urine-Other TRICHOMONAS PRESENT   Culture, blood (routine x 2)     Status: None (Preliminary result)   Collection Time: 08/20/14  3:24 PM  Result Value Ref Range   Specimen Description BLOOD RIGHT ANTECUBITAL    Special Requests BOTTLES DRAWN AEROBIC AND ANAEROBIC 8CC    Culture      GRAM POSITIVE COCCI IN CLUSTERS Gram Stain Report Called to,Read Back By and Verified With: ARMSTRONG,S. [facility]) AT [place] ON 08/21/2014 BY RESSEGGER,R. Performed at [facility]    Report Status PENDING   Culture, blood (routine x 2)     Status: None (Preliminary result)   Collection Time: 08/20/14  3:33 PM  Result Value Ref Range   Specimen Description BLOOD RIGHT HAND    Special Requests BOTTLES DRAWN AEROBIC ONLY 4CC    Culture      GRAM POSITIVE COCCI IN CLUSTERS Gram Stain Report Called to,Read Back By and Verified With: ARMSTRONG,S. [facility]) AT  0715 ON 08/21/2014 BY RESSEGGER,R  Performed at [facility]    Report Status PENDING   Lactic acid, plasma     Status: None   Collection Time: 08/20/14  5:16 PM  Result Value Ref Range   Lactic Acid, Venous 0.7 0.5 - 2.0 mmol/L  MRSA PCR Screening     Status: Abnormal   Collection Time: 08/20/14  7:48 PM  Result Value Ref Range   MRSA by PCR POSITIVE (A) NEGATIVE    Comment:        The GeneXpert MRSA Assay (FDA approved for NASAL specimens only), is one component of a comprehensive MRSA colonization surveillance program. It is not intended to diagnose MRSA infection nor to guide or monitor treatment for MRSA infections. RESULT CALLED TO, READ BACK BY AND VERIFIED WITH: ARMSTRONG,S RN [phone]  COV[place],N RESULT CALLED TO, READ BACK BY AND VERIFIED WITH: ARMSTRONG,S RN [phone] C[place],N RESULT CALLED TO, READ BACK BY AND VERIFIED WITH: ARSTRONG,S RN [id number] COVINGTON,N   Protime-INR     Status: None   Collection Time: 08/20/14  8:05 PM  Result Value Ref Range   Prothrombin Time 14.2 11.6 - 15.2 seconds   INR 1.08 0.00 - 1.49  Culture, routine-abscess     Status: None (Preliminary result)   Collection Time: 08/20/14 10:16 PM  Result Value Ref Range   Specimen Description ABSCESS    Special Requests NONE    Gram Stain      MODERATE WBC PRESENT,BOTH PMN AND MONONUCLEAR NO SQUAMOUS EPITHELIAL CELLS SEEN MODERATE GRAM POSITIVE COCCI IN PAIRS IN CLUSTERS Performed at [facility]    Culture      Culture reincubated for better growth Performed at [facility]    Report Status PENDING   Anaerobic culture     Status: None (Preliminary result)   Collection Time: 08/20/14 10:16 PM  Result Value Ref Range   Specimen Description ABSCESS    Special Requests NONE    Gram Stain  MODERATE WBC PRESENT,BOTH PMN AND MONONUCLEAR NO SQUAMOUS EPITHELIAL CELLS SEEN MODERATE GRAM POSITIVE COCCI IN PAIRS IN CLUSTERS Performed at [facility]    Culture      NO ANAEROBES ISOLATED; CULTURE IN PROGRESS FOR 5 DAYS Performed at [facility]    Report Status PENDING   CBC     Status: Abnormal   Collection Time: 08/21/14 12:25 PM  Result Value Ref Range   WBC 5.6 4.0 - 10.5 K/uL   RBC 3.45 (L) 3.87 - 5.11 MIL/uL   Hemoglobin 11.7 (L) 12.0 - 15.0 g/dL   HCT 35.5 (L) 36.0 - 46.0 %   MCV 102.9 (H) 78.0 - 100.0 fL   MCH 33.9 26.0 - 34.0 pg   MCHC 33.0 30.0 - 36.0 g/dL   RDW 13.1 11.5 - 15.5 %   Platelets 381 150 - 400 K/uL  Comprehensive metabolic panel     Status: Abnormal   Collection Time: 08/21/14 12:25 PM  Result Value Ref Range   Sodium 138 135 - 145 mmol/L   Potassium 3.1 (L) 3.5 - 5.1 mmol/L   Chloride 104 101 - 111 mmol/L    CO2 23 22 - 32 mmol/L   Glucose, Bld 93 65 - 99 mg/dL   BUN 11 6 - 20 mg/dL   Creatinine, Ser 0.65 0.44 - 1.00 mg/dL   Calcium 8.2 (L) 8.9 - 10.3 mg/dL   Total Protein 6.1 (L) 6.5 - 8.1 g/dL   Albumin 2.3 (L) 3.5 - 5.0 g/dL   AST 40 15 - 41 U/L   ALT 35 14 - 54 U/L   Alkaline Phosphatase 185 (H) 38 - 126 U/L   Total Bilirubin 0.9 0.3 - 1.2 mg/dL   GFR calc non Af Amer >60 >60 mL/min   GFR calc Af Amer >60 >60 mL/min    Comment: (NOTE) The eGFR has been calculated using the CKD EPI equation. This calculation has not been validated in all clinical situations. eGFR's persistently <60 mL/min signify possible Chronic Kidney Disease.    Anion gap 11 5 - 15    CBC Latest Ref Rng 08/21/2014 08/20/2014 11/20/2009  WBC 4.0 - 10.5 K/uL 5.6 16.3(H) 12.5(H)  Hemoglobin 12.0 - 15.0 g/dL 11.7(L) 12.8 13.5  Hematocrit 36.0 - 46.0 % 35.5(L) 38.0 40.1  Platelets 150 - 400 K/uL 381 491(H) 369      ) Recent Results (from the past 720 hour(s))  Culture, blood (routine x 2)     Status: None (Preliminary result)   Collection Time: 08/20/14  3:24 PM  Result Value Ref Range Status   Specimen Description BLOOD RIGHT ANTECUBITAL  Final   Special Requests BOTTLES DRAWN AEROBIC AND ANAEROBIC 8CC  Final   Culture   Final    GRAM POSITIVE COCCI IN CLUSTERS Gram Stain Report Called to,Read Back By and Verified With: ARMSTRONG,S. [facility]) AT [place] ON 08/21/2014 BY RESSEGGER,R. Performed at [facility]    Report Status PENDING  Incomplete  Culture, blood (routine x 2)     Status: None (Preliminary result)   Collection Time: 08/20/14  3:33 PM  Result Value Ref Range Status   Specimen Description BLOOD RIGHT HAND  Final   Special Requests BOTTLES DRAWN AEROBIC ONLY 4CC  Final   Culture   Final    GRAM POSITIVE COCCI IN CLUSTERS Gram Stain Report Called to,Read Back By and Verified With: ARMSTRONG,S. [facility]) AT  0932 ON 08/21/2014 BY RESSEGGER,R  Performed at [facility]  Report Status PENDING   Incomplete  MRSA PCR Screening     Status: Abnormal   Collection Time: 08/20/14  7:48 PM  Result Value Ref Range Status   MRSA by PCR POSITIVE (A) NEGATIVE Final    Comment:        The GeneXpert MRSA Assay (FDA approved for NASAL specimens only), is one component of a comprehensive MRSA colonization surveillance program. It is not intended to diagnose MRSA infection nor to guide or monitor treatment for MRSA infections. RESULT CALLED TO, READ BACK BY AND VERIFIED WITH: ARMSTRONG,S RN [phone] COV[place],N RESULT CALLED TO, READ BACK BY AND VERIFIED WITH: ARMSTRONG,S RN [phone] COV[place],N RESULT CALLED TO, READ BACK BY AND VERIFIED WITH: ARSTRONG,S RN [id number] COVINGTON,N   Culture, routine-abscess     Status: None (Preliminary result)   Collection Time: 08/20/14 10:16 PM  Result Value Ref Range Status   Specimen Description ABSCESS  Final   Special Requests NONE  Final   Gram Stain   Final    MODERATE WBC PRESENT,BOTH PMN AND MONONUCLEAR NO SQUAMOUS EPITHELIAL CELLS SEEN MODERATE GRAM POSITIVE COCCI IN PAIRS IN CLUSTERS Performed at [facility]    Culture   Final    Culture reincubated for better growth Performed at [facility]    Report Status PENDING  Incomplete  Anaerobic culture     Status: None (Preliminary result)   Collection Time: 08/20/14 10:16 PM  Result Value Ref Range Status   Specimen Description ABSCESS  Final   Special Requests NONE  Final   Gram Stain   Final    MODERATE WBC PRESENT,BOTH PMN AND MONONUCLEAR NO SQUAMOUS EPITHELIAL CELLS SEEN MODERATE GRAM POSITIVE COCCI IN PAIRS IN CLUSTERS Performed at [facility]    Culture   Final    NO ANAEROBES ISOLATED; CULTURE IN PROGRESS FOR 5 DAYS Performed at [facility]    Report Status PENDING  Incomplete     Impression/Recommendation  Principal Problem:   Perinephric abscess Active Problems:   Pyelonephritis   Hydronephrosis   Lupus    Rheumatoid arthritis   Anxiety   GERD without esophagitis   Alcohol abuse   Transaminitis   Deanna Ball is a 54 y.o. female with  Perinephric abscess with extension into psoas muscle, ureteral obstruction sp IR drainage with GPC and pair son GS and with pt also having GPCC in blood  #1 Perinephric abscess: Very likely source of the patients GPCC bacteremia If this is a true urinary source then this would likely end being an Enterococcus  If instead we find Staphylococcus Aureus then ID dictum is that she had disseminated bacteremia that then seeded kidney and bladder Given the amount of back pain and psoas muscle involvement might be prudent to get MRI of the L spine with contrast when able  Agree with unasyn and vancomycin pending cultures results  Repeat blood cultures  #2 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

## 2014-08-21 NOTE — Progress Notes (Signed)
PATIENT DETAILS Name: Deanna Ball Age: 54 y.o. Sex: female Date of Birth: 04-23-1960 Admit Date: 08/20/2014 Admitting Physician Ozella Rocks, MD PCP:No primary care provider on file.  Subjective: Abdominal pain/flank pain better.  Assessment/Plan: Principal Problem: Abscess right retroperitoneum/perinephric abscess involving the psoas muscle and obstructing the right ureteral pelvic junction causing hydronephrosis: . Underwent placement of right percutaneous nephrostomy tube and percutaneous drain into the right psoas/perinephric abscess by interventional radiology on 6/1. Blood cultures positive for gram-positive cocci in clusters, and Gram stain from perinephric abscess also positive for gram-positive cocci in clusters. Spoke with infectious disease, discontinue Rocephin, add vancomycin and Unasyn. Infectious disease will consult. Check echocardiogram, depending on final culture results may need a TEE. She is also complaining of some low back pain, I will check a MRI of the lumbosacral spine to make sure no discitis.  Active Problems: Sepsis: Secondary to above and gram-positive bacteremia, blood pressure now stable. Cultures and antibiotics as above.  Gram-positive bacteremia: See above. ID consulted.  History of lupus: Continue prednisone, hold methotrexate.  History of alcohol abuse: Drinks 2 bottles of wine daily. Last week was 4 days back. No signs of withdrawal currently. Continue CIWA protocol.  GERD: PPI.  Anxiety: Continue as needed Xanax.  Disposition: Remain inpatient  Antimicrobial agents  See below  Anti-infectives    Start     Dose/Rate Route Frequency Ordered Stop   08/21/14 1500  cefTRIAXone (ROCEPHIN) 1 g in dextrose 5 % 50 mL IVPB     1 g 100 mL/hr over 30 Minutes Intravenous  Once 08/20/14 1702     08/20/14 2033  ceFAZolin (ANCEF) 2-3 GM-% IVPB SOLR    Comments:  Kevan Ny   : cabinet override      08/20/14 2033 08/20/14 2042     08/20/14 1515  cefTRIAXone (ROCEPHIN) 1 g in dextrose 5 % 50 mL IVPB     1 g 100 mL/hr over 30 Minutes Intravenous  Once 08/20/14 1504 08/20/14 1613      DVT Prophylaxis: Prophylactic  Heparin   Code Status: Full code   Family Communication None at bedside  Procedures: Perc Nephrostomy/Abscess drain 6/1  CONSULTS:  ID and urology  Time spent 40 minutes-Greater than 50% of this time was spent in counseling, explanation of diagnosis, planning of further management, and coordination of care.  MEDICATIONS: Scheduled Meds: . ALPRAZolam  1 mg Oral BID  . cefTRIAXone (ROCEPHIN)  IV  1 g Intravenous Once  . Chlorhexidine Gluconate Cloth  6 each Topical Q0600  . folic acid  1 mg Intravenous Daily  . heparin  5,000 Units Subcutaneous 3 times per day  . methotrexate  25 mg Oral Once per day on Tue Thu  . mupirocin ointment  1 application Nasal BID  . pantoprazole (PROTONIX) IV  40 mg Intravenous Q24H  . predniSONE  2.5 mg Oral BID WC  . sodium chloride  1,000 mL Intravenous Once  . sodium chloride  3 mL Intravenous Q12H  . thiamine  100 mg Intravenous Daily   Continuous Infusions: . sodium chloride 125 mL/hr at 08/21/14 0913   PRN Meds:.acetaminophen **OR** acetaminophen, morphine injection, ondansetron **OR** ondansetron (ZOFRAN) IV    PHYSICAL EXAM: Vital signs in last 24 hours: Filed Vitals:   08/21/14 0800 08/21/14 0811 08/21/14 0911 08/21/14 1100  BP: 134/59   114/62  Pulse: 96  95 91  Temp:  101.4 F (  38.6 C) 98.5 F (36.9 C)   TempSrc:  Oral    Resp: 30  23 19   Height:      Weight:      SpO2: 98%  92% 91%    Weight change:  Filed Weights   08/20/14 1030 08/20/14 1824  Weight: 70.308 kg (155 lb) 71.3 kg (157 lb 3 oz)   Body mass index is 28.27 kg/(m^2).   Gen Exam: Awake and alert with clear speech.  Neck: Supple, No JVD.  Chest: B/L Clear.   CVS: S1 S2 Regular Abdomen: soft, BS +, non tender, non distended.  Extremities: no edema, lower  extremities warm to touch Neurologic: Non Focal-But with  Skin: No Rash.   Wounds: N/A.    Intake/Output from previous day:  Intake/Output Summary (Last 24 hours) at 08/21/14 1140 Last data filed at 08/21/14 0700  Gross per 24 hour  Intake   1130 ml  Output   1260 ml  Net   -130 ml     LAB RESULTS: CBC  Recent Labs Lab 08/20/14 1140  WBC 16.3*  HGB 12.8  HCT 38.0  PLT 491*  MCV 103.3*  MCH 34.8*  MCHC 33.7  RDW 13.1  LYMPHSABS 1.3  MONOABS 1.0  EOSABS 0.0  BASOSABS 0.0    Chemistries   Recent Labs Lab 08/20/14 1140  NA 136  K 3.8  CL 99*  CO2 26  GLUCOSE 108*  BUN 19  CREATININE 0.85  CALCIUM 9.0    CBG: No results for input(s): GLUCAP in the last 168 hours.  GFR Estimated Creatinine Clearance: 69.2 mL/min (by C-G formula based on Cr of 0.85).  Coagulation profile  Recent Labs Lab 08/20/14 2005  INR 1.08    Cardiac Enzymes No results for input(s): CKMB, TROPONINI, MYOGLOBIN in the last 168 hours.  Invalid input(s): CK  Invalid input(s): POCBNP No results for input(s): DDIMER in the last 72 hours. No results for input(s): HGBA1C in the last 72 hours. No results for input(s): CHOL, HDL, LDLCALC, TRIG, CHOLHDL, LDLDIRECT in the last 72 hours. No results for input(s): TSH, T4TOTAL, T3FREE, THYROIDAB in the last 72 hours.  Invalid input(s): FREET3 No results for input(s): VITAMINB12, FOLATE, FERRITIN, TIBC, IRON, RETICCTPCT in the last 72 hours.  Recent Labs  08/20/14 1132  LIPASE 16*    Urine Studies No results for input(s): UHGB, CRYS in the last 72 hours.  Invalid input(s): UACOL, UAPR, USPG, UPH, UTP, UGL, UKET, UBIL, UNIT, UROB, ULEU, UEPI, UWBC, URBC, UBAC, CAST, UCOM, BILUA  MICROBIOLOGY: Recent Results (from the past 240 hour(s))  Culture, blood (routine x 2)     Status: None (Preliminary result)   Collection Time: 08/20/14  3:24 PM  Result Value Ref Range Status   Specimen Description BLOOD RIGHT ANTECUBITAL  Final    Special Requests BOTTLES DRAWN AEROBIC AND ANAEROBIC 8CC  Final   Culture   Final    GRAM POSITIVE COCCI IN CLUSTERS Gram Stain Report Called to,Read Back By and Verified With: ARMSTRONG,S. [facility]) AT 0715 ON 08/21/2014 BY RESSEGGER,R. Performed at [facility]    Report Status PENDING  Incomplete  Culture, blood (routine x 2)     Status: None (Preliminary result)   Collection Time: 08/20/14  3:33 PM  Result Value Ref Range Status   Specimen Description BLOOD RIGHT HAND  Final   Special Requests BOTTLES DRAWN AEROBIC ONLY 4CC  Final   Culture   Final    GRAM POSITIVE COCCI IN CLUSTERS  Gram Stain Report Called to,Read Back By and Verified With: ARMSTRONG,S. [facility]) AT  0715 ON 08/21/2014 BY RESSEGGER,R  Performed at [facility]    Report Status PENDING  Incomplete  MRSA PCR Screening     Status: Abnormal   Collection Time: 08/20/14  7:48 PM  Result Value Ref Range Status   MRSA by PCR POSITIVE (A) NEGATIVE Final    Comment:        The GeneXpert MRSA Assay (FDA approved for NASAL specimens only), is one component of a comprehensive MRSA colonization surveillance program. It is not intended to diagnose MRSA infection nor to guide or monitor treatment for MRSA infections. RESULT CALLED TO, READ BACK BY AND VERIFIED WITH: ARMSTRONG,S RN [phone] [id number] COVINGTON,N RESULT CALLED TO, READ BACK BY AND VERIFIED WITH: ARMSTRONG,S RN [phone] [id number] COVINGTON,N RESULT CALLED TO, READ BACK BY AND VERIFIED WITH: ARSTRONG,S RN [id number] COVINGTON,N   Culture, routine-abscess     Status: None (Preliminary result)   Collection Time: 08/20/14 10:16 PM  Result Value Ref Range Status   Specimen Description ABSCESS  Final   Special Requests NONE  Final   Gram Stain   Final    MODERATE WBC PRESENT,BOTH PMN AND MONONUCLEAR NO SQUAMOUS EPITHELIAL CELLS SEEN MODERATE GRAM POSITIVE COCCI IN PAIRS IN CLUSTERS Performed at [facility]    Culture   Final    Culture reincubated  for better growth Performed at [facility]    Report Status PENDING  Incomplete  Anaerobic culture     Status: None (Preliminary result)   Collection Time: 08/20/14 10:16 PM  Result Value Ref Range Status   Specimen Description ABSCESS  Final   Special Requests NONE  Final   Gram Stain   Final    MODERATE WBC PRESENT,BOTH PMN AND MONONUCLEAR NO SQUAMOUS EPITHELIAL CELLS SEEN MODERATE GRAM POSITIVE COCCI IN PAIRS IN CLUSTERS Performed at [facility]    Culture   Final    NO ANAEROBES ISOLATED; CULTURE IN PROGRESS FOR 5 DAYS Performed at [facility]    Report Status PENDING  Incomplete    RADIOLOGY STUDIES/RESULTS: Ct Abdomen Pelvis W Contrast  08/20/2014   CLINICAL DATA:  Right upper quadrant abdominal pain for 1 week with nausea and vomiting. History of appendectomy, hernia repair and systemic lupus erythematosus. Moderate leukocytosis. Initial encounter.  EXAM: CT ABDOMEN AND PELVIS WITH CONTRAST  TECHNIQUE: Multidetector CT imaging of the abdomen and pelvis was performed using the standard protocol following bolus administration of intravenous contrast.  CONTRAST:  100 ml Omnipaque 300  COMPARISON:  Ultrasound same date.  FINDINGS: Minimal dependent atelectasis at both lung bases. Otherwise clear. No pleural or pericardial effusion.  Lower chest: Mildly decreased hepatic density suspicious for steatosis. No focal lesions identified.  Hepatobiliary: The liver is normal in density without focal abnormality. No evidence of gallstones, gallbladder wall thickening or biliary dilatation.  Pancreas: Unremarkable. No pancreatic ductal dilatation or surrounding inflammatory changes.  Spleen: Normal in size without focal abnormality.  Adrenals/Urinary Tract: Both adrenal glands appear normal.The left kidney appears normal. The right kidney demonstrates hydronephrosis and delayed contrast excretion. There is asymmetric perinephric soft tissue stranding on the right. There is  a complex fluid collection or centrally necrotic mass adjacent to the ureteropelvic junction, measuring 5.6 x 3.2 cm transverse on image 46. This extends into the right psoas muscle. No urinary tract calculi demonstrated. The distal ureters and bladder appear unremarkable.  Stomach/Bowel: No evidence of bowel wall thickening,  distention or surrounding inflammatory change.Mild sigmoid colon diverticulosis without surrounding inflammation.  Vascular/Lymphatic: There are several mildly prominent retroperitoneal lymph nodes, likely reactive. No mesenteric adenopathy. Mild aortoiliac atherosclerosis. The IVC and renal veins appear patent.  Reproductive: The uterus appears unremarkable.  No adnexal mass.  Other: No evidence of abdominal wall mass or hernia.  Musculoskeletal: No acute or significant osseous findings. Mild lumbar spondylosis noted. There are postsurgical changes at the lumbosacral junction with a grade 1 anterolisthesis. Moderate facet hypertrophy noted at L4-5. The paraspinal fat planes are maintained. No evidence of discitis or osteomyelitis P  IMPRESSION: 1. Suspected abscess within the right retroperitoneum involving the psoas muscle and obstructing the right ureteral pelvic junction. This is an atypical location, and necrotic neoplasm cannot be completely excluded. No other cause for ureteral obstruction identified. 2. Small retroperitoneal lymph nodes are probably reactive. No other suspicious masses identified. 3. No evidence of urinary tract calculus. 4. Urology consultation recommended. Results were discussed by telephone with Dr. Fayrene Fearing.   Electronically Signed   By: Carey Bullocks M.D.   On: 08/20/2014 14:38   US Abdomen Limited Ruq  08/20/2014   CLINICAL DATA:  Sharp RIGHT upper quadrant pain for 1 week, history lupus, rheumatoid arthritis  EXAM: US ABDOMEN LIMITED - RIGHT UPPER QUADRANT  COMPARISON:  None  FINDINGS: Gallbladder:  Minimal dependent sludge in gallbladder. No shadowing  gallstones, gallbladder wall thickening or pericholecystic fluid.  Common bile duct:  Diameter: Normal caliber 3 mm diameter  Liver:  Echogenic, likely fatty infiltration, though this can be seen with cirrhosis and certain infiltrative disorders. No focal hepatic mass or nodularity. Hepatopetal portal venous flow.  No RIGHT upper quadrant free fluid.  IMPRESSION: Probable fatty infiltration of liver as above.  Minimal gallbladder sludge.  Otherwise negative exam.   Electronically Signed   By: Ulyses Southward M.D.   On: 08/20/2014 13:04    Jeoffrey Massed, MD  [facility] Pager:[phone]  If 7PM-7AM, please contact night-coverage [email or web address] Password [email or web address] 08/21/2014, 11:40 AM   LOS: 1 day

## 2014-08-21 NOTE — Care Management Note (Signed)
Case Management Note  Patient Details  Name: Deanna Ball MRN: [id number] Date of Birth: [birth year]  Subjective/Objective:        Sepsis due to abd abcess          Action/Plan: home when stable   Expected Discharge Date:                 [id number] Expected Discharge Plan:     In-House Referral:  NA  Discharge planning Services  CM Consult  Post Acute Care Choice:  NA Choice offered to:  NA  DME Arranged:    DME Agency:     HH Arranged:    HH Agency:     Status of Service:  In process, will continue to follow  Medicare Important Message Given:    Date Medicare IM Given:    Medicare IM give by:    Date Additional Medicare IM Given:    Additional Medicare Important Message give by:     If discussed at Long Length of Stay Meetings, dates discussed:    Additional Comments:  Golda Acre, RN 08/21/2014, 10:25 AM

## 2014-08-21 NOTE — Progress Notes (Signed)
ANTIBIOTIC CONSULT NOTE - INITIAL  Pharmacy Consult for Vancomycin, Unasyn Indication: Bacteremia, abscess  No Known Allergies  Patient Measurements: Height: 5' 2.5" (158.8 cm) Weight: 157 lb 3 oz (71.3 kg) IBW/kg (Calculated) : 51.25  Vital Signs: Temp: 99.5 F (37.5 C) (06/02 1155) Temp Source: Oral (06/02 1155) BP: 114/62 mmHg (06/02 1100) Pulse Rate: 91 (06/02 1100) Intake/Output from previous day: 06/01 0701 - 06/02 0700 In: 1130 [I.V.:1125] Out: 1260 [Urine:1235; Drains:25] Intake/Output from this shift:    Labs:  Recent Labs  08/20/14 1140  WBC 16.3*  HGB 12.8  PLT 491*  CREATININE 0.85   Estimated Creatinine Clearance: 69.2 mL/min (by C-G formula based on Cr of 0.85). No results for input(s): VANCOTROUGH, VANCOPEAK, VANCORANDOM, GENTTROUGH, GENTPEAK, GENTRANDOM, TOBRATROUGH, TOBRAPEAK, TOBRARND, AMIKACINPEAK, AMIKACINTROU, AMIKACIN in the last 72 hours.   Microbiology: Recent Results (from the past 720 hour(s))  Culture, blood (routine x 2)     Status: None (Preliminary result)   Collection Time: 08/20/14  3:24 PM  Result Value Ref Range Status   Specimen Description BLOOD RIGHT ANTECUBITAL  Final   Special Requests BOTTLES DRAWN AEROBIC AND ANAEROBIC 8CC  Final   Culture   Final    GRAM POSITIVE COCCI IN CLUSTERS Gram Stain Report Called to,Read Back By and Verified With: ARMSTRONG,S. [facility]) AT 0715 ON 08/21/2014 BY RESSEGGER,R. Performed at [facility]    Report Status PENDING  Incomplete  Culture, blood (routine x 2)     Status: None (Preliminary result)   Collection Time: 08/20/14  3:33 PM  Result Value Ref Range Status   Specimen Description BLOOD RIGHT HAND  Final   Special Requests BOTTLES DRAWN AEROBIC ONLY 4CC  Final   Culture   Final    GRAM POSITIVE COCCI IN CLUSTERS Gram Stain Report Called to,Read Back By and Verified With: ARMSTRONG,S. [facility]) AT  0715 ON 08/21/2014 BY RESSEGGER,R  Performed at [facility]    Report  Status PENDING  Incomplete  MRSA PCR Screening     Status: Abnormal   Collection Time: 08/20/14  7:48 PM  Result Value Ref Range Status   MRSA by PCR POSITIVE (A) NEGATIVE Final    Comment:        The GeneXpert MRSA Assay (FDA approved for NASAL specimens only), is one component of a comprehensive MRSA colonization surveillance program. It is not intended to diagnose MRSA infection nor to guide or monitor treatment for MRSA infections. RESULT CALLED TO, READ BACK BY AND VERIFIED WITH: ARMSTRONG,S RN [phone] [id number] COVINGTON,N RESULT CALLED TO, READ BACK BY AND VERIFIED WITH: ARMSTRONG,S RN [phone] [id number] COVINGTON,N RESULT CALLED TO, READ BACK BY AND VERIFIED WITH: ARSTRONG,S RN [id number] COVINGTON,N   Culture, routine-abscess     Status: None (Preliminary result)   Collection Time: 08/20/14 10:16 PM  Result Value Ref Range Status   Specimen Description ABSCESS  Final   Special Requests NONE  Final   Gram Stain   Final    MODERATE WBC PRESENT,BOTH PMN AND MONONUCLEAR NO SQUAMOUS EPITHELIAL CELLS SEEN MODERATE GRAM POSITIVE COCCI IN PAIRS IN CLUSTERS Performed at [facility]    Culture   Final    Culture reincubated for better growth Performed at [facility]    Report Status PENDING  Incomplete  Anaerobic culture     Status: None (Preliminary result)   Collection Time: 08/20/14 10:16 PM  Result Value Ref Range Status   Specimen Description ABSCESS  Final   Special Requests NONE  Final   Gram Stain   Final    MODERATE WBC PRESENT,BOTH PMN AND MONONUCLEAR NO SQUAMOUS EPITHELIAL CELLS SEEN MODERATE GRAM POSITIVE COCCI IN PAIRS IN CLUSTERS Performed at [facility]    Culture   Final    NO ANAEROBES ISOLATED; CULTURE IN PROGRESS FOR 5 DAYS Performed at [facility]    Report Status PENDING  Incomplete    Medical History: Past Medical History  Diagnosis Date  . Lupus   . Rheumatoid arthritis     Assessment: 29 y/oF with PMH of  lupus and RA who initially presented to [facility] with c/o R-sided flank pain with associated n/v. Patient was febrile in ED and had leukocytosis. CT scan showed perinephric abscess with hydronephrosis. Patient underwent placement of R percutaneous nephrostomy tube and perc drain into R psoas/perinephric abscess on 6/1. Blood cultures positive for GPC in clusters and abscess culture shows GPC in pairs and clusters. Pharmacy consulted to assist with dosing of Vancomycin and Unasyn.   Tmax 102.7  WBC elevated at 16.3 K  SCr 0.65, CrCl ~ 74 mL/min CG  Goal of Therapy:  Vancomycin trough level 15-20 mcg/ml Appropriate antibiotic dosing for renal function and indication Eradication of infection  Plan:   Vancomycin 1500 mg IV x 1, then 1g IV q12h.  Plan for Vancomycin trough level at steady state.  Unasyn 3g IV q6h.  Monitor renal function, cultures, clinical course.  F/u ID recs.   Greer Pickerel, PharmD, BCPS Pager: [phone] 08/21/2014 1:48 PM

## 2014-08-22 ENCOUNTER — Inpatient Hospital Stay (HOSPITAL_COMMUNITY): Payer: Medicare Other

## 2014-08-22 DIAGNOSIS — A4101 Sepsis due to Methicillin susceptible Staphylococcus aureus: Secondary | ICD-10-CM | POA: Insufficient documentation

## 2014-08-22 DIAGNOSIS — B9561 Methicillin susceptible Staphylococcus aureus infection as the cause of diseases classified elsewhere: Secondary | ICD-10-CM

## 2014-08-22 LAB — BASIC METABOLIC PANEL
Anion gap: 11 (ref 5–15)
BUN: 9 mg/dL (ref 6–20)
CO2: 25 mmol/L (ref 22–32)
Calcium: 8.3 mg/dL — ABNORMAL LOW (ref 8.9–10.3)
Chloride: 101 mmol/L (ref 101–111)
Creatinine, Ser: 0.59 mg/dL (ref 0.44–1.00)
GFR calc Af Amer: >60 mL/min (ref >60)
GFR calc non Af Amer: >60 mL/min (ref >60)
GLUCOSE: 89 mg/dL (ref 65–99)
Potassium: 2.9 mmol/L — ABNORMAL LOW (ref 3.5–5.1)
Sodium: 137 mmol/L (ref 135–145)

## 2014-08-22 LAB — HIV ANTIBODY (ROUTINE TESTING W REFLEX): HIV Screen 4th Generation wRfx: NONREACTIVE

## 2014-08-22 LAB — CBC
HCT: 33.4 % — ABNORMAL LOW (ref 36.0–46.0)
HEMOGLOBIN: 11.3 g/dL — AB (ref 12.0–15.0)
MCH: 33.9 pg (ref 26.0–34.0)
MCHC: 33.8 g/dL (ref 30.0–36.0)
MCV: 100.3 fL — AB (ref 78.0–100.0)
PLATELETS: 387 10*3/uL (ref 150–400)
RBC: 3.33 MIL/uL — ABNORMAL LOW (ref 3.87–5.11)
RDW: 13 % (ref 11.5–15.5)
WBC: 7.8 10*3/uL (ref 4.0–10.5)

## 2014-08-22 LAB — TROPONIN I: Troponin I: <0.03 ng/mL (ref <0.031)

## 2014-08-22 LAB — URINE CULTURE: Colony Count: >=100000

## 2014-08-22 LAB — SEDIMENTATION RATE: Sed Rate: 108 mm/hr — ABNORMAL HIGH (ref 0–22)

## 2014-08-22 LAB — PREALBUMIN: PREALBUMIN: 2.7 mg/dL — AB (ref 18–38)

## 2014-08-22 LAB — C-REACTIVE PROTEIN: CRP: 33.3 mg/dL — ABNORMAL HIGH (ref <1.0)

## 2014-08-22 MED ORDER — CEFAZOLIN SODIUM-DEXTROSE 2-3 GM-% IV SOLR
2.0000 g | Freq: Three times a day (TID) | INTRAVENOUS | Status: DC
  Administered 2014-08-22 – 2014-08-29 (×21): 2 g via INTRAVENOUS
  Filled 2014-08-22 (×21): qty 50

## 2014-08-22 MED ORDER — GADOBENATE DIMEGLUMINE 529 MG/ML IV SOLN
15.0000 mL | Freq: Once | INTRAVENOUS | Status: AC | PRN
  Administered 2014-08-22: 15 mL via INTRAVENOUS

## 2014-08-22 MED ORDER — OXYCODONE HCL 5 MG PO TABS
5.0000 mg | ORAL_TABLET | ORAL | Status: DC | PRN
  Administered 2014-08-23: 10 mg via ORAL
  Administered 2014-08-24 – 2014-08-27 (×6): 5 mg via ORAL
  Administered 2014-08-27 – 2014-08-29 (×5): 10 mg via ORAL
  Administered 2014-08-29: 5 mg via ORAL
  Filled 2014-08-22 (×3): qty 2
  Filled 2014-08-22: qty 1
  Filled 2014-08-22 (×2): qty 2
  Filled 2014-08-22 (×7): qty 1
  Filled 2014-08-22: qty 2
  Filled 2014-08-22: qty 1
  Filled 2014-08-22: qty 2

## 2014-08-22 MED ORDER — POTASSIUM CHLORIDE CRYS ER 20 MEQ PO TBCR
40.0000 meq | EXTENDED_RELEASE_TABLET | ORAL | Status: AC
  Administered 2014-08-22 (×2): 40 meq via ORAL
  Filled 2014-08-22 (×2): qty 2

## 2014-08-22 MED ORDER — NITROGLYCERIN 0.4 MG SL SUBL
SUBLINGUAL_TABLET | SUBLINGUAL | Status: AC
  Administered 2014-08-22: 0.4 mg
  Filled 2014-08-22: qty 1

## 2014-08-22 MED ORDER — POLYETHYLENE GLYCOL 3350 17 G PO PACK
17.0000 g | PACK | Freq: Every day | ORAL | Status: DC
  Administered 2014-08-23 – 2014-08-28 (×2): 17 g via ORAL
  Filled 2014-08-22 (×8): qty 1

## 2014-08-22 MED ORDER — KETOROLAC TROMETHAMINE 30 MG/ML IJ SOLN
30.0000 mg | Freq: Once | INTRAMUSCULAR | Status: AC
  Administered 2014-08-22: 30 mg via INTRAVENOUS
  Filled 2014-08-22: qty 1

## 2014-08-22 MED ORDER — SENNA 8.6 MG PO TABS
2.0000 | ORAL_TABLET | Freq: Every day | ORAL | Status: DC
  Administered 2014-08-24: 17.2 mg via ORAL
  Filled 2014-08-22 (×6): qty 2

## 2014-08-22 NOTE — Progress Notes (Signed)
Patient complained of chest pain, sharp towards right side 6/10 pain.  EKG done and vitals are both normal.  Nitro given X'1s. Pain subsided. Dr. Jerral Ralph notified.

## 2014-08-22 NOTE — Progress Notes (Addendum)
[facility]      Subjective:  C/o pain in her breast tissue on right side   Antibiotics:  Anti-infectives    Start     Dose/Rate Route Frequency Ordered Stop   08/22/14 1400  ceFAZolin (ANCEF) IVPB 2 g/50 mL premix     2 g 100 mL/hr over 30 Minutes Intravenous 3 times per day 08/22/14 1209     08/22/14 0200  vancomycin (VANCOCIN) IVPB 1000 mg/200 mL premix     1,000 mg 200 mL/hr over 60 Minutes Intravenous Every 12 hours 08/21/14 1213     08/21/14 1500  cefTRIAXone (ROCEPHIN) 1 g in dextrose 5 % 50 mL IVPB  Status:  Discontinued     1 g 100 mL/hr over 30 Minutes Intravenous  Once 08/20/14 1702 08/21/14 1206   08/21/14 1300  vancomycin (VANCOCIN) 1,500 mg in sodium chloride 0.9 % 500 mL IVPB     1,500 mg 250 mL/hr over 120 Minutes Intravenous  Once 08/21/14 1213 08/21/14 1700   08/21/14 1230  Ampicillin-Sulbactam (UNASYN) 3 g in sodium chloride 0.9 % 100 mL IVPB  Status:  Discontinued     3 g 100 mL/hr over 60 Minutes Intravenous 4 times per day 08/21/14 1208 08/22/14 1209   08/20/14 2033  ceFAZolin (ANCEF) 2-3 GM-% IVPB SOLR    Comments:  Kevan Ny   : cabinet override      08/20/14 2033 08/20/14 2042   08/20/14 1515  cefTRIAXone (ROCEPHIN) 1 g in dextrose 5 % 50 mL IVPB     1 g 100 mL/hr over 30 Minutes Intravenous  Once 08/20/14 1504 08/20/14 1613      Medications: Scheduled Meds: . ALPRAZolam  1 mg Oral BID  .  ceFAZolin (ANCEF) IV  2 g Intravenous 3 times per day  . Chlorhexidine Gluconate Cloth  6 each Topical Q0600  . folic acid  1 mg Intravenous Daily  . heparin  5,000 Units Subcutaneous 3 times per day  . mupirocin ointment  1 application Nasal BID  . pantoprazole  40 mg Oral Daily  . polyethylene glycol  17 g Oral Daily  . predniSONE  2.5 mg Oral BID WC  . senna  2 tablet Oral QHS  . sodium chloride  1,000 mL Intravenous Once  . sodium chloride  3 mL Intravenous Q12H  . thiamine  100 mg Intravenous Daily  .  vancomycin  1,000 mg Intravenous Q12H   Continuous Infusions: . sodium chloride 75 mL/hr at 08/22/14 0030   PRN Meds:.acetaminophen **OR** acetaminophen, morphine injection, ondansetron **OR** ondansetron (ZOFRAN) IV, oxyCODONE    Objective: Weight change:   Intake/Output Summary (Last 24 hours) at 08/22/14 1459 Last data filed at 08/22/14 1258  Gross per 24 hour  Intake 3395.83 ml  Output   3065 ml  Net 330.83 ml   Blood pressure 120/65, pulse 86, temperature 98 F (36.7 C), temperature source Oral, resp. rate 20, height 5' 2.5" (1.588 m), weight 157 lb 3 oz (71.3 kg), SpO2 94 %. Temp:  [98 F (36.7 C)-99.8 F (37.7 C)] 98 F (36.7 C) (06/03 0750) Pulse Rate:  [85-99] 86 (06/03 0750) Resp:  [20-29] 20 (06/03 0514) BP: (113-149)/(58-80) 120/65 mmHg (06/03 0750) SpO2:  [93 %-97 %] 94 % (06/03 0750)  Physical Exam: General: Alert and awake, oriented x3, not in any acute distress. HEENT: EOMI, oropharynx clear and without exudate CVS tachycardic rate, normal r, no murmur rubs or  gallops Chest: clear to auscultation bilaterally, no wheezing, rales or rhonchi Abdomen: distended, diffusely tender esp LLQ + Bs Extremities:2+ edema, RA changes in hand with Swan neck defomities  Neuro: nonfocal  CBC: CBC Latest Ref Rng 08/22/2014 08/21/2014 08/20/2014  WBC 4.0 - 10.5 K/uL 7.8 5.6 16.3(H)  Hemoglobin 12.0 - 15.0 g/dL 11.3(L) 11.7(L) 12.8  Hematocrit 36.0 - 46.0 % 33.4(L) 35.5(L) 38.0  Platelets 150 - 400 K/uL 387 381 491(H)       BMET  Recent Labs  08/21/14 1225 08/22/14 0511  NA 138 137  K 3.1* 2.9*  CL 104 101  CO2 23 25  GLUCOSE 93 89  BUN 11 9  CREATININE 0.65 0.59  CALCIUM 8.2* 8.3*     Liver Panel   Recent Labs  08/20/14 1140 08/21/14 1225  PROT 7.3 6.1*  ALBUMIN 2.9* 2.3*  AST 96* 40  ALT 56* 35  ALKPHOS 246* 185*  BILITOT 0.8 0.9     Sedimentation Rate  Recent Labs  08/22/14 0511  ESRSEDRATE 108*   C-Reactive Protein  Recent Labs   08/22/14 0541  CRP 33.3*    Micro Results: Recent Results (from the past 720 hour(s))  Urine culture     Status: None (Preliminary result)   Collection Time: 08/20/14  1:15 PM  Result Value Ref Range Status   Specimen Description URINE, CLEAN CATCH  Final   Special Requests NONE  Final   Colony Count   Final    >=100,000 COLONIES/ML Performed at [facility]    Culture   Final    STAPHYLOCOCCUS AUREUS Note: RIFAMPIN AND GENTAMICIN SHOULD NOT BE USED AS SINGLE DRUGS FOR TREATMENT OF STAPH INFECTIONS. Performed at [facility]    Report Status PENDING  Incomplete  Culture, blood (routine x 2)     Status: None (Preliminary result)   Collection Time: 08/20/14  3:24 PM  Result Value Ref Range Status   Specimen Description BLOOD RIGHT ANTECUBITAL  Final   Special Requests BOTTLES DRAWN AEROBIC AND ANAEROBIC 8CC  Final   Culture   Final    GRAM POSITIVE COCCI IN CLUSTERS Note: Gram Stain Report Called to,Read Back By and Verified With:  ARMSTRONG S @ 0715 08/21/2014 BY RESSEGGER RESISTANT Performed at [facility]    Report Status PENDING  Incomplete  Culture, blood (routine x 2)     Status: None (Preliminary result)   Collection Time: 08/20/14  3:33 PM  Result Value Ref Range Status   Specimen Description BLOOD RIGHT HAND  Final   Special Requests BOTTLES DRAWN AEROBIC ONLY 4CC  Final   Culture   Final    GRAM POSITIVE COCCI IN CLUSTERS Note: Gram Stain Report Called to,Read Back By and Verified With: ARMSTRONG S [facility]) AT 0715 ON 08/21/2014 BY RESSEGGER R Performed at [facility]    Report Status PENDING  Incomplete  MRSA PCR Screening     Status: Abnormal   Collection Time: 08/20/14  7:48 PM  Result Value Ref Range Status   MRSA by PCR POSITIVE (A) NEGATIVE Final    Comment:        The GeneXpert MRSA Assay (FDA approved for NASAL specimens only), is one component of a comprehensive MRSA colonization surveillance program. It is  not intended to diagnose MRSA infection nor to guide or monitor treatment for MRSA infections. RESULT CALLED TO, READ BACK BY AND VERIFIED WITH: ARMSTRONG,S RN [phone] COVINGTON,N RESULT CALLED TO, READ BACK BY AND VERIFIED WITH:  ARMSTRONG,S RN [phone] [id number] COVINGTON,N RESULT CALLED TO, READ BACK BY AND VERIFIED WITH: ARSTRONG,S RN [id number] COVINGTON,N   Culture, routine-abscess     Status: None (Preliminary result)   Collection Time: 08/20/14 10:16 PM  Result Value Ref Range Status   Specimen Description ABSCESS  Final   Special Requests NONE  Final   Gram Stain   Final    MODERATE WBC PRESENT,BOTH PMN AND MONONUCLEAR NO SQUAMOUS EPITHELIAL CELLS SEEN MODERATE GRAM POSITIVE COCCI IN PAIRS IN CLUSTERS Performed at [facility]    Culture   Final    ABUNDANT STAPHYLOCOCCUS AUREUS Note: RIFAMPIN AND GENTAMICIN SHOULD NOT BE USED AS SINGLE DRUGS FOR TREATMENT OF STAPH INFECTIONS. Performed at [facility]    Report Status PENDING  Incomplete  Anaerobic culture     Status: None (Preliminary result)   Collection Time: 08/20/14 10:16 PM  Result Value Ref Range Status   Specimen Description ABSCESS  Final   Special Requests NONE  Final   Gram Stain   Final    MODERATE WBC PRESENT,BOTH PMN AND MONONUCLEAR NO SQUAMOUS EPITHELIAL CELLS SEEN MODERATE GRAM POSITIVE COCCI IN PAIRS IN CLUSTERS Performed at [facility]    Culture   Final    NO ANAEROBES ISOLATED; CULTURE IN PROGRESS FOR 5 DAYS Performed at [facility]    Report Status PENDING  Incomplete  Blood Cultures x 2 sites     Status: None (Preliminary result)   Collection Time: 08/21/14  4:50 PM  Result Value Ref Range Status   Specimen Description BLOOD RIGHT HAND  Final   Special Requests BOTTLES DRAWN AEROBIC ONLY 10CC  Final   Culture   Final           BLOOD CULTURE RECEIVED NO GROWTH TO DATE CULTURE WILL BE HELD FOR 5 DAYS BEFORE ISSUING A FINAL NEGATIVE REPORT Performed at  [facility]    Report Status PENDING  Incomplete  Blood Cultures x 2 sites     Status: None (Preliminary result)   Collection Time: 08/21/14  5:05 PM  Result Value Ref Range Status   Specimen Description BLOOD RIGHT ARM  Final   Special Requests BOTTLES DRAWN AEROBIC AND ANAEROBIC 10CC  Final   Culture   Final           BLOOD CULTURE RECEIVED NO GROWTH TO DATE CULTURE WILL BE HELD FOR 5 DAYS BEFORE ISSUING A FINAL NEGATIVE REPORT Performed at [facility]    Report Status PENDING  Incomplete    Studies/Results: Dg Cervical Spine Complete  08/22/2014   CLINICAL DATA:  Rheumatoid arthritis, potential planning for transesophageal echocardiography, history lupus  EXAM: CERVICAL SPINE  4+ VIEWS  COMPARISON:  None  FINDINGS: Straightening of cervical lordosis question muscle spasm.  Prevertebral soft tissues normal to upper normal in thickness.  Vertebral body and disc space heights maintained.  No acute fracture, subluxation or bone destruction.  Predental space normal.  Osseous neural foramina grossly patent.  Lung apices clear.  Facet alignments normal.  C1-C2 alignment normal.  IMPRESSION: No acute abnormalities.   Electronically Signed   By: Ulyses Southward M.D.   On: 08/22/2014 14:44   Mr Lumbar Spine W Wo Contrast  08/22/2014   CLINICAL DATA:  Retroperitoneal abscess. 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  EXAM: MRI LUMBAR SPINE WITHOUT AND WITH CONTRAST  TECHNIQUE: Multiplanar and multiecho pulse sequences of the lumbar spine were obtained without and with intravenous contrast.  CONTRAST:  77mL  MULTIHANCE GADOBENATE DIMEGLUMINE 529 MG/ML IV SOLN  COMPARISON:  CT 08/20/2014.  FINDINGS: Segmentation: The numbering convention used for this exam termed L5-S1 as the last intervertebral disc space. Numbering was correlated with prior CT.  Alignment: Grade I anterolisthesis of L5 on S1 is a chronic finding and probably associated with old pars defects. Decompression with solid  posterior lateral fusion.  Vertebrae: Benign hemangioma present at T12. Degenerative endplate changes are present at L1-L2. L4 inferior endplate edema appears discogenic and shows mild post gadolinium enhancement. This is unlikely to represent discogenic infection given the degenerated disc and vacuum disc seen on prior CT 08/20/2014.  Conus medullaris: Normal termination at L1.  Paraspinal tissues: RIGHT nephrostomy and RIGHT sella is drains noted. No definite interval change compared to prior abdominal CT.  Disc levels:  Disc Signal: Disc degeneration at L4-L5. Partial ossification of the L5-S1 disc associated with posterior lateral fusion and posterior decompression.  L1-L2:  Negative.  L2-L3:  Facet hypertrophy without stenosis.  Normal disc.  L3-L4:  Mild facet degeneration.  No stenosis.  L4-L5: There is distraction of the LEFT facet joint with some fluid in the joint and enhancing granulation tissue or synovitis in the posterior aspect of the joint. The joint is degenerated and given the distraction, this is most compatible with ordinary degenerative changes and facet osteoarthritis. Infection of the facet joint is considered unlikely. There is little if any periarticular edema around the LEFT L4-L5 facet joint. The RIGHT facet joint appears normal. An isolated septic facet arthritis even in the setting of psoas abscess is unlikely although there is imaging overlap between the appearance is of infection and degenerative disease.  Disc degeneration. Shallow broad-based posterior disc protrusion superimposed on disc bulging. Bilateral L4 laminotomies have been performed and central canal is decompressed. There is bilateral subarticular stenosis. LEFT foraminal stenosis is moderate, associated with degenerative disc and facet disease.  L5-S1: Partial ossification of the disc. Solid posterior lateral fusion and laminectomy with good decompression of the central canal.  IMPRESSION: 1. No convincing findings of  spinal infection in this patient with psoas abscess and drainage. Mild inflammatory changes are present at the LEFT L4-L5 facet joint that are more compatible with ordinary degenerative disease than septic arthritis. Although there is imaging overlap between the appearances, septic arthritis is unlikely. 2. Solid L5-S1 fusion and decompression. 3. 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   Electronically Signed   By: Andreas Newport M.D.   On: 08/22/2014 08:25   Ir Nephrostomy Placement Right  08/21/2014   CLINICAL DATA:  54 year old female with sepsis secondary to right psoas/ perinephric abscess. This abscess collection results in at least partial obstruction of the proximal ureter and right hydronephrosis. Additionally, there is clinical concern for possible pyonephrosis of the obstructed kidney. Therefore, percutaneous nephrostomy tube placement is warranted. This will then be followed by percutaneous drainage of the psoas/perinephric abscess.  EXAM: IR NEPHROSTOMY PLACEMENT RIGHT  Date: 08/21/2014  PROCEDURE: 1. Ultrasound-guided puncture right renal collecting system 2. Placement of a 10 French percutaneous nephrostomy tube using fluoroscopic guidance Interventional Radiologist:  Sterling Big, MD  ANESTHESIA/SEDATION: Moderate (conscious) sedation was used. 2 mg Versed, 100 mcg Fentanyl were administered intravenously. The patient's vital signs were monitored continuously by radiology nursing throughout the procedure.  Sedation Time: 25 minutes  MEDICATIONS: 2 g Ancef administered intravenously  FLUOROSCOPY TIME:  4 minutes 30 seconds  81 mGy  CONTRAST:  20mL OMNIPAQUE  IOHEXOL 300 MG/ML  SOLN  TECHNIQUE: Informed consent was obtained from the patient following explanation of the procedure, risks, benefits and alternatives. The patient understands, agrees and consents for the procedure. All questions were addressed. A  time out was performed.  Maximal barrier sterile technique utilized including caps, mask, sterile gowns, sterile gloves, large sterile drape, hand hygiene, and Betadine skin prep.  The right flank was interrogated with ultrasound. A suitable posterior inferior calyx was identified. Local anesthesia was attained by infiltration with 1% lidocaine. A small dermatotomy was made. Using real-time ultrasound guidance, the posterior inferior calyx was punctured with a 21 gauge Accustick needle. Free return of turbid urine from the needle once the stylet was removed confirmed its location within the collecting system. A gentle hand injection of contrast material opacified the selected lower pole calyx and infundibulum.  A night tracks wire was then advanced into the renal pelvis. The Accustick needle was exchanged for the Accustick sheath. A Bentson wire was then coiled in the renal pelvis and the Accustick sheath removed. The skin tract was dilated to 10 [place] and a [place] all-purpose drainage catheter was then advanced over the wire and formed with the locking loop in the renal pelvis. A final image was obtained after confirming location within the renal pelvis by gentle hand injection of contrast.  The nephrostomy tube was then secured to the skin with 0 Prolene suture and a sterile bandage. The catheter was connected to gravity bag drainage. The patient tolerated the procedure well.  COMPLICATIONS: None  IMPRESSION: Technically successful placement of a 10 French right-sided percutaneous nephrostomy tube.  Signed,  Sterling Big, MD  [facility]  [facility]   Electronically Signed   By: Malachy Moan M.D.   On: 08/21/2014 16:52   Ct Image Guided Drainage By Percutaneous Catheter  08/21/2014   CLINICAL DATA:  54 year old female with a right retroperitoneal abscess and associated hydronephrosis. CT-guided drain placement is warranted in the setting of  sepsis.  EXAM: CT IMAGE GUIDED DRAINAGE BY PERCUTANEOUS CATHETER  Date: 08/21/2014  PROCEDURE: 1. CT-guided drain placement in the right psoas muscle/perinephric space Interventional Radiologist:  Sterling Big, MD  ANESTHESIA/SEDATION: Moderate (conscious) sedation was used. 0.5 mg Versed, 25 mcg Fentanyl were administered intravenously. The patient's vital signs were monitored continuously by radiology nursing throughout the procedure.  Sedation Time: 15 minutes  MEDICATIONS: 2 g Ancef given within 1 hour of skin incision  TECHNIQUE: Informed consent was obtained from the patient following explanation of the procedure, risks, benefits and alternatives. The patient understands, agrees and consents for the procedure. All questions were addressed. A time out was performed.  A planning axial CT scan was performed. The fluid collection within the right psoas musculature was successfully identified. A suitable skin entry site was selected and marked. The region was then sterilely prepped and draped in standard fashion using Betadine skin prep. Local anesthesia was attained by infiltration with 1% lidocaine. A small dermatotomy was made. Under intermittent CT fluoroscopic guidance, an 18 gauge introducer needle was advanced into the fluid collection. An Amplatz wire was then coiled within the fluid collection in the skin tract dilated to 12 [place]. A Cook 12 [place] all-purpose drainage catheter was then advanced over the wire and formed within the fluid collection. Aspiration yields 20 mL of thick, frankly purulent material. A sample was sent for Gram stain and culture.  Axial CT imaging was then performed confirming good positioning of the  drainage catheter. The fluid collection has notably decreased in size. The drainage tube was then secured to the skin with 0 Prolene suture and an adhesive fixation device. The tube was connected to JP bulb suction.  COMPLICATIONS: None  IMPRESSION: Successful placement of a 12  French percutaneous drainage catheter in the right psoas/perinephric fluid collection.  Aspiration yields 20 mL thick, frankly purulent material. Sample was sent for culture.  PLAN: Once drainage has been minimal (less than 20 mL) for several days, recommend repeat CT scan with contrast to ensure resolution of the abscess prior to tube removal.  Signed,  Sterling Big, MD  [facility]  [facility]   Electronically Signed   By: Malachy Moan M.D.   On: 08/21/2014 18:00      Assessment/Plan:  Principal Problem:   Perinephric abscess Active Problems:   Pyelonephritis   Hydronephrosis   Lupus   Rheumatoid arthritis   Anxiety   GERD without esophagitis   Alcohol abuse   Transaminitis   Abscess   Ureteral obstruction   Screening for STD (sexually transmitted disease)   Psoas abscess    Deanna Ball is a 54 y.o. female with  RA. Alchollsm, Perinephric abscess with extension into psoas muscle, ureteral obstruction sp IR drainage with Staphylococcus Aureus growing from abscess, urine and looking to be recovered from blood.  #1      [facility] Antimicrobial Management Team Staphylococcus aureus bacteremia   Staphylococcus aureus bacteremia (SAB) is associated with a high rate of complications and mortality.  Specific aspects of clinical management are critical to optimizing the outcome of patients with SAB.  Therefore, the [facility] Antimicrobial Management Team [facility]) has initiated an intervention aimed at improving the management of SAB at [facility].  To do so, Infectious Diseases physicians are providing an evidence-based consult for the management of all patients with SAB.     Yes No Comments  Perform follow-up blood cultures (even if the patient is afebrile) to ensure clearance of bacteremia   BLOOD CULTURES TAKEN  Remove vascular catheter and obtain follow-up blood cultures after the removal of the catheter   DO  NOT PLACE CENTRAL LINE OR PICC UNTIL WE ARE OUT 72 HOURS AT LEAST  Perform echocardiography to evaluate for endocarditis (transthoracic ECHO is 40-50% sensitive, TEE is > 90% sensitive)   Please keep in mind, that neither test can definitively EXCLUDE endocarditis, and that should clinical suspicion remain high for endocarditis the patient should then still be treated with an "endocarditis" duration of therapy = 6 weeks    TTE NO VEGETATIONS, WOULD CONSIDER TEE AND I AM GETTING PLAIN XRAY SOF C SPINE TO ENSURE SHE DOES NOT HAVE CERVICAL SPINE PATHOLOGY THAT MIGHT MAKE DOING A TEE DANGEROUS  Consult electrophysiologist to evaluate implanted cardiac device (pacemaker, ICD)   NA  Ensure source control   Have all abscesses been drained effectively? Have deep seeded infections (septic joints or osteomyelitis) had appropriate surgical debridement?  ABSCESS IS DRAINED   Investigate for "metastatic" sites of infection   Does the patient have ANY symptom or physical exam finding that would suggest a deeper infection (back or neck pain that may be suggestive of vertebral osteomyelitis or epidural abscess, muscle pain that could be a symptom of pyomyositis)?  Keep in mind that for deep seeded infections MRI imaging with contrast is preferred rather than other often insensitive tests such as plain x-rays, especially early in a patient's presentation.  MRI DID  NOT SHOW CLEAR DISKITIS BUT I WOULD GO AHEAD AND TREAT PRESUMPTIVELY FOR DISKITIS IN TERMS OF DURATION     Change antibiotic therapy to VANCOMYCIN, AND CEFAZOLIN []  []  Beta-lactam antibiotics are preferred for MSSA due to higher cure rates.   If on Vancomycin, goal trough should be 15 - 20 mcg/mL  Estimated duration of IV antibiotic therapy:  8 WEEKS []  []  Consult case management for probably prolonged outpatient IV antibiotic therapy   Dr. Drue Second will be covering this weekend and is available for questions.     LOS: 2 days    Acey Lav 08/22/2014, 2:59 PM

## 2014-08-22 NOTE — Progress Notes (Signed)
Patient is refusing to get up and use bedside commode as well as sitting in chair.  Patient has been educated on the importance but is still refusing.

## 2014-08-22 NOTE — Progress Notes (Signed)
Reviewed ID notes Positive c/s noted blood and abscess Staph in urine as well Modest output from perc tube Voiding well Flank pain RT improving Will follow and await ID recommendations

## 2014-08-22 NOTE — Progress Notes (Signed)
Patient ID: Deanna Ball, female   DOB: June 25, 1960, 53 y.o.   MRN: [id number]    Referring Physician(s): McDiarmid,S  Subjective:  Pt feeling a little better; still has some rt flank/back and rt ant chest discomfort; eating cheeseburger; denies N/V  Allergies: Review of patient's allergies indicates no known allergies.  Medications: Prior to Admission medications   Medication Sig Start Date End Date Taking? Authorizing Provider  ALPRAZolam Prudy Feeler) 1 MG tablet Take 1 mg by mouth 2 (two) times daily.  07/31/14  Yes Historical Provider, MD  HYDROcodone-acetaminophen (NORCO/VICODIN) 5-325 MG per tablet  07/31/14  Yes Historical Provider, MD  methotrexate (RHEUMATREX) 2.5 MG tablet Take 25 mg by mouth 2 (two) times a week. Caution:Chemotherapy. Protect from light.   Yes Historical Provider, MD  naproxen sodium (ANAPROX) 220 MG tablet Take 220 mg by mouth 2 (two) times daily with a meal.   Yes Historical Provider, MD  NEXIUM 40 MG capsule  07/17/14  Yes Historical Provider, MD  predniSONE (DELTASONE) 2.5 MG tablet Take 2.5 mg by mouth 2 (two) times daily with a meal.   Yes Historical Provider, MD  Vitamin D, Ergocalciferol, (DRISDOL) 50000 UNITS CAPS capsule Take 50,000 Units by mouth every 7 (seven) days.   Yes Historical Provider, MD     Vital Signs: BP 120/65 mmHg  Pulse 86  Temp(Src) 98 F (36.7 C) (Oral)  Resp 20  Ht 5' 2.5" (1.588 m)  Wt 157 lb 3 oz (71.3 kg)  BMI 28.27 kg/m2  SpO2 94%  Physical Exam pt awake/alert; rt psoas/PCN drains intact, insertion site ok, mild-mod tender; outputs 15/250 cc's respectively; purulent blood tinged fluid in psoas JP, blood-tinged urine in PCN; cx's- staph  Imaging: Dg Cervical Spine Complete  08/22/2014   CLINICAL DATA:  Rheumatoid arthritis, potential planning for transesophageal echocardiography, history lupus  EXAM: CERVICAL SPINE  4+ VIEWS  COMPARISON:  None  FINDINGS: Straightening of cervical lordosis question muscle spasm.   Prevertebral soft tissues normal to upper normal in thickness.  Vertebral body and disc space heights maintained.  No acute fracture, subluxation or bone destruction.  Predental space normal.  Osseous neural foramina grossly patent.  Lung apices clear.  Facet alignments normal.  C1-C2 alignment normal.  IMPRESSION: No acute abnormalities.   Electronically Signed   By: Ulyses Southward M.D.   On: 08/22/2014 14:44   Mr Lumbar Spine W Wo Contrast  08/22/2014   CLINICAL DATA:  Retroperitoneal abscess. 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  EXAM: MRI LUMBAR SPINE WITHOUT AND WITH CONTRAST  TECHNIQUE: Multiplanar and multiecho pulse sequences of the lumbar spine were obtained without and with intravenous contrast.  CONTRAST:  65mL MULTIHANCE GADOBENATE DIMEGLUMINE 529 MG/ML IV SOLN  COMPARISON:  CT 08/20/2014.  FINDINGS: Segmentation: The numbering convention used for this exam termed L5-S1 as the last intervertebral disc space. Numbering was correlated with prior CT.  Alignment: Grade I anterolisthesis of L5 on S1 is a chronic finding and probably associated with old pars defects. Decompression with solid posterior lateral fusion.  Vertebrae: Benign hemangioma present at T12. Degenerative endplate changes are present at L1-L2. L4 inferior endplate edema appears discogenic and shows mild post gadolinium enhancement. This is unlikely to represent discogenic infection given the degenerated disc and vacuum disc seen on prior CT 08/20/2014.  Conus medullaris: Normal termination at L1.  Paraspinal tissues: RIGHT nephrostomy and RIGHT sella is drains noted. No definite interval change compared to prior abdominal CT.  Disc levels:  Disc Signal: Disc degeneration at  L4-L5. Partial ossification of the L5-S1 disc associated with posterior lateral fusion and posterior decompression.  L1-L2:  Negative.  L2-L3:  Facet hypertrophy without stenosis.  Normal disc.  L3-L4:  Mild facet degeneration.  No stenosis.  L4-L5: There  is distraction of the LEFT facet joint with some fluid in the joint and enhancing granulation tissue or synovitis in the posterior aspect of the joint. The joint is degenerated and given the distraction, this is most compatible with ordinary degenerative changes and facet osteoarthritis. Infection of the facet joint is considered unlikely. There is little if any periarticular edema around the LEFT L4-L5 facet joint. The RIGHT facet joint appears normal. An isolated septic facet arthritis even in the setting of psoas abscess is unlikely although there is imaging overlap between the appearance is of infection and degenerative disease.  Disc degeneration. Shallow broad-based posterior disc protrusion superimposed on disc bulging. Bilateral L4 laminotomies have been performed and central canal is decompressed. There is bilateral subarticular stenosis. LEFT foraminal stenosis is moderate, associated with degenerative disc and facet disease.  L5-S1: Partial ossification of the disc. Solid posterior lateral fusion and laminectomy with good decompression of the central canal.  IMPRESSION: 1. No convincing findings of spinal infection in this patient with psoas abscess and drainage. Mild inflammatory changes are present at the LEFT L4-L5 facet joint that are more compatible with ordinary degenerative disease than septic arthritis. Although there is imaging overlap between the appearances, septic arthritis is unlikely. 2. Solid L5-S1 fusion and decompression. 3. 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   Electronically Signed   By: Andreas Newport M.D.   On: 08/22/2014 08:25   Ct Abdomen Pelvis W Contrast  08/20/2014   CLINICAL DATA:  Right upper quadrant abdominal pain for 1 week with nausea and vomiting. History of appendectomy, hernia repair and systemic lupus erythematosus. Moderate leukocytosis. Initial encounter.  EXAM: CT ABDOMEN  AND PELVIS WITH CONTRAST  TECHNIQUE: Multidetector CT imaging of the abdomen and pelvis was performed using the standard protocol following bolus administration of intravenous contrast.  CONTRAST:  100 ml Omnipaque 300  COMPARISON:  Ultrasound same date.  FINDINGS: Minimal dependent atelectasis at both lung bases. Otherwise clear. No pleural or pericardial effusion.  Lower chest: Mildly decreased hepatic density suspicious for steatosis. No focal lesions identified.  Hepatobiliary: The liver is normal in density without focal abnormality. No evidence of gallstones, gallbladder wall thickening or biliary dilatation.  Pancreas: Unremarkable. No pancreatic ductal dilatation or surrounding inflammatory changes.  Spleen: Normal in size without focal abnormality.  Adrenals/Urinary Tract: Both adrenal glands appear normal.The left kidney appears normal. The right kidney demonstrates hydronephrosis and delayed contrast excretion. There is asymmetric perinephric soft tissue stranding on the right. There is a complex fluid collection or centrally necrotic mass adjacent to the ureteropelvic junction, measuring 5.6 x 3.2 cm transverse on image 46. This extends into the right psoas muscle. No urinary tract calculi demonstrated. The distal ureters and bladder appear unremarkable.  Stomach/Bowel: No evidence of bowel wall thickening, distention or surrounding inflammatory change.Mild sigmoid colon diverticulosis without surrounding inflammation.  Vascular/Lymphatic: There are several mildly prominent retroperitoneal lymph nodes, likely reactive. No mesenteric adenopathy. Mild aortoiliac atherosclerosis. The IVC and renal veins appear patent.  Reproductive: The uterus appears unremarkable.  No adnexal mass.  Other: No evidence of abdominal wall mass or hernia.  Musculoskeletal: No acute or significant osseous findings. Mild lumbar spondylosis noted. There are postsurgical changes at  the lumbosacral junction with a grade 1  anterolisthesis. Moderate facet hypertrophy noted at L4-5. The paraspinal fat planes are maintained. No evidence of discitis or osteomyelitis P  IMPRESSION: 1. Suspected abscess within the right retroperitoneum involving the psoas muscle and obstructing the right ureteral pelvic junction. This is an atypical location, and necrotic neoplasm cannot be completely excluded. No other cause for ureteral obstruction identified. 2. Small retroperitoneal lymph nodes are probably reactive. No other suspicious masses identified. 3. No evidence of urinary tract calculus. 4. Urology consultation recommended. Results were discussed by telephone with Dr. Fayrene Fearing.   Electronically Signed   By: Carey Bullocks M.D.   On: 08/20/2014 14:38   Ir Nephrostomy Placement Right  08/21/2014   CLINICAL DATA:  54 year old female with sepsis secondary to right psoas/ perinephric abscess. This abscess collection results in at least partial obstruction of the proximal ureter and right hydronephrosis. Additionally, there is clinical concern for possible pyonephrosis of the obstructed kidney. Therefore, percutaneous nephrostomy tube placement is warranted. This will then be followed by percutaneous drainage of the psoas/perinephric abscess.  EXAM: IR NEPHROSTOMY PLACEMENT RIGHT  Date: 08/21/2014  PROCEDURE: 1. Ultrasound-guided puncture right renal collecting system 2. Placement of a 10 French percutaneous nephrostomy tube using fluoroscopic guidance Interventional Radiologist:  Sterling Big, MD  ANESTHESIA/SEDATION: Moderate (conscious) sedation was used. 2 mg Versed, 100 mcg Fentanyl were administered intravenously. The patient's vital signs were monitored continuously by radiology nursing throughout the procedure.  Sedation Time: 25 minutes  MEDICATIONS: 2 g Ancef administered intravenously  FLUOROSCOPY TIME:  4 minutes 30 seconds  81 mGy  CONTRAST:  62mL OMNIPAQUE IOHEXOL 300 MG/ML  SOLN  TECHNIQUE: Informed consent was obtained from the  patient following explanation of the procedure, risks, benefits and alternatives. The patient understands, agrees and consents for the procedure. All questions were addressed. A time out was performed.  Maximal barrier sterile technique utilized including caps, mask, sterile gowns, sterile gloves, large sterile drape, hand hygiene, and Betadine skin prep.  The right flank was interrogated with ultrasound. A suitable posterior inferior calyx was identified. Local anesthesia was attained by infiltration with 1% lidocaine. A small dermatotomy was made. Using real-time ultrasound guidance, the posterior inferior calyx was punctured with a 21 gauge Accustick needle. Free return of turbid urine from the needle once the stylet was removed confirmed its location within the collecting system. A gentle hand injection of contrast material opacified the selected lower pole calyx and infundibulum.  A night tracks wire was then advanced into the renal pelvis. The Accustick needle was exchanged for the Accustick sheath. A Bentson wire was then coiled in the renal pelvis and the Accustick sheath removed. The skin tract was dilated to 10 [place] and a [place] all-purpose drainage catheter was then advanced over the wire and formed with the locking loop in the renal pelvis. A final image was obtained after confirming location within the renal pelvis by gentle hand injection of contrast.  The nephrostomy tube was then secured to the skin with 0 Prolene suture and a sterile bandage. The catheter was connected to gravity bag drainage. The patient tolerated the procedure well.  COMPLICATIONS: None  IMPRESSION: Technically successful placement of a 10 French right-sided percutaneous nephrostomy tube.  Signed,  Sterling Big, MD  [facility]  [facility]   Electronically Signed   By: Malachy Moan M.D.   On: 08/21/2014 16:52   Ct Image Guided Drainage By Percutaneous  Catheter  08/21/2014   CLINICAL DATA:  54 year old female with a right retroperitoneal abscess and associated hydronephrosis. CT-guided drain placement is warranted in the setting of sepsis.  EXAM: CT IMAGE GUIDED DRAINAGE BY PERCUTANEOUS CATHETER  Date: 08/21/2014  PROCEDURE: 1. CT-guided drain placement in the right psoas muscle/perinephric space Interventional Radiologist:  Sterling Big, MD  ANESTHESIA/SEDATION: Moderate (conscious) sedation was used. 0.5 mg Versed, 25 mcg Fentanyl were administered intravenously. The patient's vital signs were monitored continuously by radiology nursing throughout the procedure.  Sedation Time: 15 minutes  MEDICATIONS: 2 g Ancef given within 1 hour of skin incision  TECHNIQUE: Informed consent was obtained from the patient following explanation of the procedure, risks, benefits and alternatives. The patient understands, agrees and consents for the procedure. All questions were addressed. A time out was performed.  A planning axial CT scan was performed. The fluid collection within the right psoas musculature was successfully identified. A suitable skin entry site was selected and marked. The region was then sterilely prepped and draped in standard fashion using Betadine skin prep. Local anesthesia was attained by infiltration with 1% lidocaine. A small dermatotomy was made. Under intermittent CT fluoroscopic guidance, an 18 gauge introducer needle was advanced into the fluid collection. An Amplatz wire was then coiled within the fluid collection in the skin tract dilated to 12 [place]. A Cook 12 [place] all-purpose drainage catheter was then advanced over the wire and formed within the fluid collection. Aspiration yields 20 mL of thick, frankly purulent material. A sample was sent for Gram stain and culture.  Axial CT imaging was then performed confirming good positioning of the drainage catheter. The fluid collection has notably decreased in size. The drainage tube was then  secured to the skin with 0 Prolene suture and an adhesive fixation device. The tube was connected to JP bulb suction.  COMPLICATIONS: None  IMPRESSION: Successful placement of a 12 French percutaneous drainage catheter in the right psoas/perinephric fluid collection.  Aspiration yields 20 mL thick, frankly purulent material. Sample was sent for culture.  PLAN: Once drainage has been minimal (less than 20 mL) for several days, recommend repeat CT scan with contrast to ensure resolution of the abscess prior to tube removal.  Signed,  Sterling Big, MD  [facility]  [facility]   Electronically Signed   By: Malachy Moan M.D.   On: 08/21/2014 18:00   US Abdomen Limited Ruq  08/20/2014   CLINICAL DATA:  Sharp RIGHT upper quadrant pain for 1 week, history lupus, rheumatoid arthritis  EXAM: US ABDOMEN LIMITED - RIGHT UPPER QUADRANT  COMPARISON:  None  FINDINGS: Gallbladder:  Minimal dependent sludge in gallbladder. No shadowing gallstones, gallbladder wall thickening or pericholecystic fluid.  Common bile duct:  Diameter: Normal caliber 3 mm diameter  Liver:  Echogenic, likely fatty infiltration, though this can be seen with cirrhosis and certain infiltrative disorders. No focal hepatic mass or nodularity. Hepatopetal portal venous flow.  No RIGHT upper quadrant free fluid.  IMPRESSION: Probable fatty infiltration of liver as above.  Minimal gallbladder sludge.  Otherwise negative exam.   Electronically Signed   By: Ulyses Southward M.D.   On: 08/20/2014 13:04    Labs:  CBC:  Recent Labs  08/20/14 1140 08/21/14 1225 08/22/14 0511  WBC 16.3* 5.6 7.8  HGB 12.8 11.7* 11.3*  HCT 38.0 35.5* 33.4*  PLT 491* 381 387    COAGS:  Recent Labs  08/20/14 2005  INR 1.08    BMP:  Recent Labs  08/20/14 1140 08/21/14 1225 08/22/14 0511  NA 136 138 137  K 3.8 3.1* 2.9*  CL 99* 104 101  CO2 GLUCOSE 108* 93 89  BUN CALCIUM 9.0 8.2*  8.3*  CREATININE 0.85 0.65 0.59  GFRNONAA >60 >60 >60  GFRAA >60 >60 >60    LIVER FUNCTION TESTS:  Recent Labs  08/20/14 1140 08/21/14 1225  BILITOT 0.8 0.9  AST 96* 40  ALT 56* 35  ALKPHOS 246* 185*  PROT 7.3 6.1*  ALBUMIN 2.9* 2.3*    Assessment and Plan:  S/p right PCN,rt psoas abscess drainage 6/1; currently afebrile; WBC/creat nl; K 2.9- replace; cx's-staph; recommend f/u in IR drain clinic with CT abd (with IV contrast) in 2 weeks, fluoroscopic guided check and exchange of PCN in 4-6 weeks- at that time  if ureter patent consider capping trial of PCN; if ureteral stenosis noted consider JJ placement; other plans as per urology/IM  Signed: D. Jeananne Rama 08/22/2014, 4:01 PM   I spent a total of 15 minutes in face to face in clinical consultation/evaluation, greater than 50% of which was counseling/coordinating care for rt psoas abscess drain/rt nephrostomy

## 2014-08-22 NOTE — Progress Notes (Addendum)
PATIENT DETAILS Name: Deanna Ball Age: 54 y.o. Sex: female Date of Birth: 1960/05/24 Admit Date: 08/20/2014 Admitting Physician Ozella Rocks, MD PCP:No primary care provider on file.  Subjective: Continues to have low back pain and pain in her right flank area  Assessment/Plan: Principal Problem: Abscess right retroperitoneum/perinephric abscess involving the psoas muscle and obstructing the right ureteral pelvic junction causing hydronephrosis: Underwent placement of right percutaneous nephrostomy tube and percutaneous drain into the right psoas/perinephric abscess by interventional radiology on 6/1. Abscess culture positive for Staphylococcus aureus-sensitivities pending. Blood cultures positive for gram-positive cocci in clusters. Infectious disease consulted, will continue with vancomycin and Unasyn.  Transthoracic echocardiogram negative for vegetations, if blood culture confirms Staphylococcus, will likely need TEE. MRI of the lower back negative for discitis. Await further recommendations from infectious disease.  Active Problems: Sepsis: Secondary to above and gram-positive bacteremia, blood pressure now stable. Cultures and antibiotics as above.  Gram-positive bacteremia: See above. ID consulted.  Right-sided chest pain: Easily reproducible. Likely musculoskeletal. Troponin 1 negative. EKG negative. We'll give 1 dose of Toradol and follow.  Hypokalemia: Replete and recheck.  History of lupus: Continue prednisone, hold methotrexate.  History of alcohol abuse: Drinks 2 bottles of wine daily. Last week was 4 days back. No signs of withdrawal currently. Continue CIWA protocol.  GERD: PPI.  Anxiety: Continue as needed Xanax.  Disposition: Remain inpatient-will require several more days of hospitalization.  Antimicrobial agents  See below  Anti-infectives    Start     Dose/Rate Route Frequency Ordered Stop   08/22/14 1400  ceFAZolin (ANCEF) IVPB 2  g/50 mL premix     2 g 100 mL/hr over 30 Minutes Intravenous 3 times per day 08/22/14 1209     08/22/14 0200  vancomycin (VANCOCIN) IVPB 1000 mg/200 mL premix     1,000 mg 200 mL/hr over 60 Minutes Intravenous Every 12 hours 08/21/14 1213     08/21/14 1500  cefTRIAXone (ROCEPHIN) 1 g in dextrose 5 % 50 mL IVPB  Status:  Discontinued     1 g 100 mL/hr over 30 Minutes Intravenous  Once 08/20/14 1702 08/21/14 1206   08/21/14 1300  vancomycin (VANCOCIN) 1,500 mg in sodium chloride 0.9 % 500 mL IVPB     1,500 mg 250 mL/hr over 120 Minutes Intravenous  Once 08/21/14 1213 08/21/14 1700   08/21/14 1230  Ampicillin-Sulbactam (UNASYN) 3 g in sodium chloride 0.9 % 100 mL IVPB  Status:  Discontinued     3 g 100 mL/hr over 60 Minutes Intravenous 4 times per day 08/21/14 1208 08/22/14 1209   08/20/14 2033  ceFAZolin (ANCEF) 2-3 GM-% IVPB SOLR    Comments:  Kevan Ny   : cabinet override      08/20/14 2033 08/20/14 2042   08/20/14 1515  cefTRIAXone (ROCEPHIN) 1 g in dextrose 5 % 50 mL IVPB     1 g 100 mL/hr over 30 Minutes Intravenous  Once 08/20/14 1504 08/20/14 1613      DVT Prophylaxis: Prophylactic  Heparin   Code Status: Full code   Family Communication Mother at bedside  Procedures: Perc Nephrostomy/Abscess drain 6/1  CONSULTS:  ID and urology  Time spent 30 minutes-Greater than 50% of this time was spent in counseling, explanation of diagnosis, planning of further management, and coordination of care.  MEDICATIONS: Scheduled Meds: . ALPRAZolam  1 mg Oral BID  .  ceFAZolin (ANCEF) IV  2 g Intravenous 3 times per day  . Chlorhexidine Gluconate Cloth  6 each Topical Q0600  . folic acid  1 mg Intravenous Daily  . heparin  5,000 Units Subcutaneous 3 times per day  . mupirocin ointment  1 application Nasal BID  . pantoprazole  40 mg Oral Daily  . predniSONE  2.5 mg Oral BID WC  . sodium chloride  1,000 mL Intravenous Once  . sodium chloride  3 mL Intravenous Q12H  .  thiamine  100 mg Intravenous Daily  . vancomycin  1,000 mg Intravenous Q12H   Continuous Infusions: . sodium chloride 75 mL/hr at 08/22/14 0030   PRN Meds:.acetaminophen **OR** acetaminophen, morphine injection, ondansetron **OR** ondansetron (ZOFRAN) IV    PHYSICAL EXAM: Vital signs in last 24 hours: Filed Vitals:   08/21/14 2045 08/22/14 0514 08/22/14 0732 08/22/14 0750  BP: 113/61 128/80 114/66 120/65  Pulse: 85 94 92 86  Temp: 98.3 F (36.8 C) 98.2 F (36.8 C) 98 F (36.7 C) 98 F (36.7 C)  TempSrc: Oral Oral Oral Oral  Resp: 24 20    Height:      Weight:      SpO2: 95% 94% 93% 94%    Weight change:  Filed Weights   08/20/14 1030 08/20/14 1824  Weight: 70.308 kg (155 lb) 71.3 kg (157 lb 3 oz)   Body mass index is 28.27 kg/(m^2).   Gen Exam: Awake and alert with clear speech.  Neck: Supple, No JVD.  Chest: B/L Clear.  No rales CVS: S1 S2 Regular Abdomen: soft, BS +, non tender, non distended.  Extremities: no edema, lower extremities warm to touch Neurologic: Non Focal-But with generalized weakness Skin: No Rash.   Wounds: N/A.    Intake/Output from previous day:  Intake/Output Summary (Last 24 hours) at 08/22/14 1418 Last data filed at 08/22/14 1258  Gross per 24 hour  Intake 3395.83 ml  Output   3065 ml  Net 330.83 ml     LAB RESULTS: CBC  Recent Labs Lab 08/20/14 1140 08/21/14 1225 08/22/14 0511  WBC 16.3* 5.6 7.8  HGB 12.8 11.7* 11.3*  HCT 38.0 35.5* 33.4*  PLT 491* 381 387  MCV 103.3* 102.9* 100.3*  MCH 34.8* 33.9 33.9  MCHC 33.7 33.0 33.8  RDW 13.1 13.1 13.0  LYMPHSABS 1.3  --   --   MONOABS 1.0  --   --   EOSABS 0.0  --   --   BASOSABS 0.0  --   --     Chemistries   Recent Labs Lab 08/20/14 1140 08/21/14 1225 08/22/14 0511  NA 136 138 137  K 3.8 3.1* 2.9*  CL 99* 104 101  CO2 GLUCOSE 108* 93 89  BUN CREATININE 0.85 0.65 0.59  CALCIUM 9.0 8.2* 8.3*    CBG: No results for input(s): GLUCAP in the  last 168 hours.  GFR Estimated Creatinine Clearance: 73.5 mL/min (by C-G formula based on Cr of 0.59).  Coagulation profile  Recent Labs Lab 08/20/14 2005  INR 1.08    Cardiac Enzymes  Recent Labs Lab 08/22/14 0952  TROPONINI <0.03    Invalid input(s): POCBNP No results for input(s): DDIMER in the last 72 hours. No results for input(s): HGBA1C in the last 72 hours. No results for input(s): CHOL, HDL, LDLCALC, TRIG, CHOLHDL, LDLDIRECT in the last 72 hours. No results for input(s): TSH, T4TOTAL, T3FREE, THYROIDAB in the last 72 hours.  Invalid input(s): FREET3 No  results for input(s): VITAMINB12, FOLATE, FERRITIN, TIBC, IRON, RETICCTPCT in the last 72 hours.  Recent Labs  08/20/14 1132  LIPASE 16*    Urine Studies No results for input(s): UHGB, CRYS in the last 72 hours.  Invalid input(s): UACOL, UAPR, USPG, UPH, UTP, UGL, UKET, UBIL, UNIT, UROB, ULEU, UEPI, UWBC, URBC, UBAC, CAST, UCOM, BILUA  MICROBIOLOGY: Recent Results (from the past 240 hour(s))  Urine culture     Status: None (Preliminary result)   Collection Time: 08/20/14  1:15 PM  Result Value Ref Range Status   Specimen Description URINE, CLEAN CATCH  Final   Special Requests NONE  Final   Colony Count   Final    >=100,000 COLONIES/ML Performed at [facility]    Culture   Final    STAPHYLOCOCCUS AUREUS Note: RIFAMPIN AND GENTAMICIN SHOULD NOT BE USED AS SINGLE DRUGS FOR TREATMENT OF STAPH INFECTIONS. Performed at [facility]    Report Status PENDING  Incomplete  Culture, blood (routine x 2)     Status: None (Preliminary result)   Collection Time: 08/20/14  3:24 PM  Result Value Ref Range Status   Specimen Description BLOOD RIGHT ANTECUBITAL  Final   Special Requests BOTTLES DRAWN AEROBIC AND ANAEROBIC 8CC  Final   Culture   Final    GRAM POSITIVE COCCI IN CLUSTERS Note: Gram Stain Report Called to,Read Back By and Verified With:  ARMSTRONG S @ 0715 08/21/2014 BY RESSEGGER  RESISTANT Performed at [facility]    Report Status PENDING  Incomplete  Culture, blood (routine x 2)     Status: None (Preliminary result)   Collection Time: 08/20/14  3:33 PM  Result Value Ref Range Status   Specimen Description BLOOD RIGHT HAND  Final   Special Requests BOTTLES DRAWN AEROBIC ONLY 4CC  Final   Culture   Final    GRAM POSITIVE COCCI IN CLUSTERS Note: Gram Stain Report Called to,Read Back By and Verified With: ARMSTRONG S [facility]) AT 0715 ON 08/21/2014 BY RESSEGGER R Performed at [facility]    Report Status PENDING  Incomplete  MRSA PCR Screening     Status: Abnormal   Collection Time: 08/20/14  7:48 PM  Result Value Ref Range Status   MRSA by PCR POSITIVE (A) NEGATIVE Final    Comment:        The GeneXpert MRSA Assay (FDA approved for NASAL specimens only), is one component of a comprehensive MRSA colonization surveillance program. It is not intended to diagnose MRSA infection nor to guide or monitor treatment for MRSA infections. RESULT CALLED TO, READ BACK BY AND VERIFIED WITH: ARMSTRONG,S RN [phone] [id number] C[place],N RESULT CALLED TO, READ BACK BY AND VERIFIED WITH: ARMSTRONG,S RN [phone] [id number] COVINGTON,N RESULT CALLED TO, READ BACK BY AND VERIFIED WITH: ARSTRONG,S RN [id number] COVINGTON,N   Culture, routine-abscess     Status: None (Preliminary result)   Collection Time: 08/20/14 10:16 PM  Result Value Ref Range Status   Specimen Description ABSCESS  Final   Special Requests NONE  Final   Gram Stain   Final    MODERATE WBC PRESENT,BOTH PMN AND MONONUCLEAR NO SQUAMOUS EPITHELIAL CELLS SEEN MODERATE GRAM POSITIVE COCCI IN PAIRS IN CLUSTERS Performed at [facility]    Culture   Final    ABUNDANT STAPHYLOCOCCUS AUREUS Note: RIFAMPIN AND GENTAMICIN SHOULD NOT BE USED AS SINGLE DRUGS FOR TREATMENT OF STAPH INFECTIONS. Performed at [facility]    Report Status PENDING  Incomplete  Anaerobic culture     Status: None  (Preliminary result)   Collection Time: 08/20/14 10:16 PM  Result Value Ref Range Status   Specimen Description ABSCESS  Final   Special Requests NONE  Final   Gram Stain   Final    MODERATE WBC PRESENT,BOTH PMN AND MONONUCLEAR NO SQUAMOUS EPITHELIAL CELLS SEEN MODERATE GRAM POSITIVE COCCI IN PAIRS IN CLUSTERS Performed at [facility]    Culture   Final    NO ANAEROBES ISOLATED; CULTURE IN PROGRESS FOR 5 DAYS Performed at [facility]    Report Status PENDING  Incomplete  Blood Cultures x 2 sites     Status: None (Preliminary result)   Collection Time: 08/21/14  4:50 PM  Result Value Ref Range Status   Specimen Description BLOOD RIGHT HAND  Final   Special Requests BOTTLES DRAWN AEROBIC ONLY 10CC  Final   Culture   Final           BLOOD CULTURE RECEIVED NO GROWTH TO DATE CULTURE WILL BE HELD FOR 5 DAYS BEFORE ISSUING A FINAL NEGATIVE REPORT Performed at [facility]    Report Status PENDING  Incomplete  Blood Cultures x 2 sites     Status: None (Preliminary result)   Collection Time: 08/21/14  5:05 PM  Result Value Ref Range Status   Specimen Description BLOOD RIGHT ARM  Final   Special Requests BOTTLES DRAWN AEROBIC AND ANAEROBIC 10CC  Final   Culture   Final           BLOOD CULTURE RECEIVED NO GROWTH TO DATE CULTURE WILL BE HELD FOR 5 DAYS BEFORE ISSUING A FINAL NEGATIVE REPORT Performed at [facility]    Report Status PENDING  Incomplete    RADIOLOGY STUDIES/RESULTS: Mr Lumbar Spine W Wo Contrast  08/22/2014   CLINICAL DATA:  Retroperitoneal abscess. 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  EXAM: MRI LUMBAR SPINE WITHOUT AND WITH CONTRAST  TECHNIQUE: Multiplanar and multiecho pulse sequences of the lumbar spine were obtained without and with intravenous contrast.  CONTRAST:  48mL MULTIHANCE GADOBENATE DIMEGLUMINE 529 MG/ML IV SOLN  COMPARISON:  CT 08/20/2014.  FINDINGS: Segmentation: The numbering convention used for this exam  termed L5-S1 as the last intervertebral disc space. Numbering was correlated with prior CT.  Alignment: Grade I anterolisthesis of L5 on S1 is a chronic finding and probably associated with old pars defects. Decompression with solid posterior lateral fusion.  Vertebrae: Benign hemangioma present at T12. Degenerative endplate changes are present at L1-L2. L4 inferior endplate edema appears discogenic and shows mild post gadolinium enhancement. This is unlikely to represent discogenic infection given the degenerated disc and vacuum disc seen on prior CT 08/20/2014.  Conus medullaris: Normal termination at L1.  Paraspinal tissues: RIGHT nephrostomy and RIGHT sella is drains noted. No definite interval change compared to prior abdominal CT.  Disc levels:  Disc Signal: Disc degeneration at L4-L5. Partial ossification of the L5-S1 disc associated with posterior lateral fusion and posterior decompression.  L1-L2:  Negative.  L2-L3:  Facet hypertrophy without stenosis.  Normal disc.  L3-L4:  Mild facet degeneration.  No stenosis.  L4-L5: There is distraction of the LEFT facet joint with some fluid in the joint and enhancing granulation tissue or synovitis in the posterior aspect of the joint. The joint is degenerated and given the distraction, this is most compatible with ordinary degenerative changes and facet osteoarthritis. Infection of the facet joint is considered unlikely. There is little if any periarticular edema  around the LEFT L4-L5 facet joint. The RIGHT facet joint appears normal. An isolated septic facet arthritis even in the setting of psoas abscess is unlikely although there is imaging overlap between the appearance is of infection and degenerative disease.  Disc degeneration. Shallow broad-based posterior disc protrusion superimposed on disc bulging. Bilateral L4 laminotomies have been performed and central canal is decompressed. There is bilateral subarticular stenosis. LEFT foraminal stenosis is moderate,  associated with degenerative disc and facet disease.  L5-S1: Partial ossification of the disc. Solid posterior lateral fusion and laminectomy with good decompression of the central canal.  IMPRESSION: 1. No convincing findings of spinal infection in this patient with psoas abscess and drainage. Mild inflammatory changes are present at the LEFT L4-L5 facet joint that are more compatible with ordinary degenerative disease than septic arthritis. Although there is imaging overlap between the appearances, septic arthritis is unlikely. 2. Solid L5-S1 fusion and decompression. 3. 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   Electronically Signed   By: Andreas Newport M.D.   On: 08/22/2014 08:25   Ct Abdomen Pelvis W Contrast  08/20/2014   CLINICAL DATA:  Right upper quadrant abdominal pain for 1 week with nausea and vomiting. History of appendectomy, hernia repair and systemic lupus erythematosus. Moderate leukocytosis. Initial encounter.  EXAM: CT ABDOMEN AND PELVIS WITH CONTRAST  TECHNIQUE: Multidetector CT imaging of the abdomen and pelvis was performed using the standard protocol following bolus administration of intravenous contrast.  CONTRAST:  100 ml Omnipaque 300  COMPARISON:  Ultrasound same date.  FINDINGS: Minimal dependent atelectasis at both lung bases. Otherwise clear. No pleural or pericardial effusion.  Lower chest: Mildly decreased hepatic density suspicious for steatosis. No focal lesions identified.  Hepatobiliary: The liver is normal in density without focal abnormality. No evidence of gallstones, gallbladder wall thickening or biliary dilatation.  Pancreas: Unremarkable. No pancreatic ductal dilatation or surrounding inflammatory changes.  Spleen: Normal in size without focal abnormality.  Adrenals/Urinary Tract: Both adrenal glands appear normal.The left kidney appears normal. The right kidney demonstrates  hydronephrosis and delayed contrast excretion. There is asymmetric perinephric soft tissue stranding on the right. There is a complex fluid collection or centrally necrotic mass adjacent to the ureteropelvic junction, measuring 5.6 x 3.2 cm transverse on image 46. This extends into the right psoas muscle. No urinary tract calculi demonstrated. The distal ureters and bladder appear unremarkable.  Stomach/Bowel: No evidence of bowel wall thickening, distention or surrounding inflammatory change.Mild sigmoid colon diverticulosis without surrounding inflammation.  Vascular/Lymphatic: There are several mildly prominent retroperitoneal lymph nodes, likely reactive. No mesenteric adenopathy. Mild aortoiliac atherosclerosis. The IVC and renal veins appear patent.  Reproductive: The uterus appears unremarkable.  No adnexal mass.  Other: No evidence of abdominal wall mass or hernia.  Musculoskeletal: No acute or significant osseous findings. Mild lumbar spondylosis noted. There are postsurgical changes at the lumbosacral junction with a grade 1 anterolisthesis. Moderate facet hypertrophy noted at L4-5. The paraspinal fat planes are maintained. No evidence of discitis or osteomyelitis P  IMPRESSION: 1. Suspected abscess within the right retroperitoneum involving the psoas muscle and obstructing the right ureteral pelvic junction. This is an atypical location, and necrotic neoplasm cannot be completely excluded. No other cause for ureteral obstruction identified. 2. Small retroperitoneal lymph nodes are probably reactive. No other suspicious masses identified. 3. No evidence of urinary tract calculus. 4. Urology consultation recommended. Results were discussed by telephone with Dr. Fayrene Fearing.   Electronically  Signed   By: Carey Bullocks M.D.   On: 08/20/2014 14:38   Ir Nephrostomy Placement Right  08/21/2014   CLINICAL DATA:  54 year old female with sepsis secondary to right psoas/ perinephric abscess. This abscess collection  results in at least partial obstruction of the proximal ureter and right hydronephrosis. Additionally, there is clinical concern for possible pyonephrosis of the obstructed kidney. Therefore, percutaneous nephrostomy tube placement is warranted. This will then be followed by percutaneous drainage of the psoas/perinephric abscess.  EXAM: IR NEPHROSTOMY PLACEMENT RIGHT  Date: 08/21/2014  PROCEDURE: 1. Ultrasound-guided puncture right renal collecting system 2. Placement of a 10 French percutaneous nephrostomy tube using fluoroscopic guidance Interventional Radiologist:  Sterling Big, MD  ANESTHESIA/SEDATION: Moderate (conscious) sedation was used. 2 mg Versed, 100 mcg Fentanyl were administered intravenously. The patient's vital signs were monitored continuously by radiology nursing throughout the procedure.  Sedation Time: 25 minutes  MEDICATIONS: 2 g Ancef administered intravenously  FLUOROSCOPY TIME:  4 minutes 30 seconds  81 mGy  CONTRAST:  20mL OMNIPAQUE IOHEXOL 300 MG/ML  SOLN  TECHNIQUE: Informed consent was obtained from the patient following explanation of the procedure, risks, benefits and alternatives. The patient understands, agrees and consents for the procedure. All questions were addressed. A time out was performed.  Maximal barrier sterile technique utilized including caps, mask, sterile gowns, sterile gloves, large sterile drape, hand hygiene, and Betadine skin prep.  The right flank was interrogated with ultrasound. A suitable posterior inferior calyx was identified. Local anesthesia was attained by infiltration with 1% lidocaine. A small dermatotomy was made. Using real-time ultrasound guidance, the posterior inferior calyx was punctured with a 21 gauge Accustick needle. Free return of turbid urine from the needle once the stylet was removed confirmed its location within the collecting system. A gentle hand injection of contrast material opacified the selected lower pole calyx and infundibulum.   A night tracks wire was then advanced into the renal pelvis. The Accustick needle was exchanged for the Accustick sheath. A Bentson wire was then coiled in the renal pelvis and the Accustick sheath removed. The skin tract was dilated to 10 [place] and a [place] all-purpose drainage catheter was then advanced over the wire and formed with the locking loop in the renal pelvis. A final image was obtained after confirming location within the renal pelvis by gentle hand injection of contrast.  The nephrostomy tube was then secured to the skin with 0 Prolene suture and a sterile bandage. The catheter was connected to gravity bag drainage. The patient tolerated the procedure well.  COMPLICATIONS: None  IMPRESSION: Technically successful placement of a 10 French right-sided percutaneous nephrostomy tube.  Signed,  Sterling Big, MD  [facility]  [facility]   Electronically Signed   By: Malachy Moan M.D.   On: 08/21/2014 16:52   Ct Image Guided Drainage By Percutaneous Catheter  08/21/2014   CLINICAL DATA:  54 year old female with a right retroperitoneal abscess and associated hydronephrosis. CT-guided drain placement is warranted in the setting of sepsis.  EXAM: CT IMAGE GUIDED DRAINAGE BY PERCUTANEOUS CATHETER  Date: 08/21/2014  PROCEDURE: 1. CT-guided drain placement in the right psoas muscle/perinephric space Interventional Radiologist:  Sterling Big, MD  ANESTHESIA/SEDATION: Moderate (conscious) sedation was used. 0.5 mg Versed, 25 mcg Fentanyl were administered intravenously. The patient's vital signs were monitored continuously by radiology nursing throughout the procedure.  Sedation Time: 15 minutes  MEDICATIONS: 2 g Ancef given within 1 hour of skin  incision  TECHNIQUE: Informed consent was obtained from the patient following explanation of the procedure, risks, benefits and alternatives. The patient understands, agrees and consents for the  procedure. All questions were addressed. A time out was performed.  A planning axial CT scan was performed. The fluid collection within the right psoas musculature was successfully identified. A suitable skin entry site was selected and marked. The region was then sterilely prepped and draped in standard fashion using Betadine skin prep. Local anesthesia was attained by infiltration with 1% lidocaine. A small dermatotomy was made. Under intermittent CT fluoroscopic guidance, an 18 gauge introducer needle was advanced into the fluid collection. An Amplatz wire was then coiled within the fluid collection in the skin tract dilated to 12 [place]. A Cook 12 [place] all-purpose drainage catheter was then advanced over the wire and formed within the fluid collection. Aspiration yields 20 mL of thick, frankly purulent material. A sample was sent for Gram stain and culture.  Axial CT imaging was then performed confirming good positioning of the drainage catheter. The fluid collection has notably decreased in size. The drainage tube was then secured to the skin with 0 Prolene suture and an adhesive fixation device. The tube was connected to JP bulb suction.  COMPLICATIONS: None  IMPRESSION: Successful placement of a 12 French percutaneous drainage catheter in the right psoas/perinephric fluid collection.  Aspiration yields 20 mL thick, frankly purulent material. Sample was sent for culture.  PLAN: Once drainage has been minimal (less than 20 mL) for several days, recommend repeat CT scan with contrast to ensure resolution of the abscess prior to tube removal.  Signed,  Sterling Big, MD  [facility]  [facility]   Electronically Signed   By: Malachy Moan M.D.   On: 08/21/2014 18:00   US Abdomen Limited Ruq  08/20/2014   CLINICAL DATA:  Sharp RIGHT upper quadrant pain for 1 week, history lupus, rheumatoid arthritis  EXAM: US ABDOMEN LIMITED - RIGHT UPPER QUADRANT   COMPARISON:  None  FINDINGS: Gallbladder:  Minimal dependent sludge in gallbladder. No shadowing gallstones, gallbladder wall thickening or pericholecystic fluid.  Common bile duct:  Diameter: Normal caliber 3 mm diameter  Liver:  Echogenic, likely fatty infiltration, though this can be seen with cirrhosis and certain infiltrative disorders. No focal hepatic mass or nodularity. Hepatopetal portal venous flow.  No RIGHT upper quadrant free fluid.  IMPRESSION: Probable fatty infiltration of liver as above.  Minimal gallbladder sludge.  Otherwise negative exam.   Electronically Signed   By: Ulyses Southward M.D.   On: 08/20/2014 13:04    Jeoffrey Massed, MD  [facility] Pager:[phone]  If 7PM-7AM, please contact night-coverage [email or web address] Password [email or web address] 08/22/2014, 2:18 PM   LOS: 2 days

## 2014-08-23 ENCOUNTER — Encounter (HOSPITAL_COMMUNITY): Payer: Self-pay | Admitting: Radiology

## 2014-08-23 ENCOUNTER — Inpatient Hospital Stay (HOSPITAL_COMMUNITY): Payer: Medicare Other

## 2014-08-23 DIAGNOSIS — R7881 Bacteremia: Secondary | ICD-10-CM

## 2014-08-23 DIAGNOSIS — A4101 Sepsis due to Methicillin susceptible Staphylococcus aureus: Principal | ICD-10-CM

## 2014-08-23 LAB — CULTURE, BLOOD (ROUTINE X 2)

## 2014-08-23 LAB — BASIC METABOLIC PANEL
Anion gap: 7 (ref 5–15)
BUN: 10 mg/dL (ref 6–20)
CHLORIDE: 107 mmol/L (ref 101–111)
CO2: 26 mmol/L (ref 22–32)
Calcium: 8.4 mg/dL — ABNORMAL LOW (ref 8.9–10.3)
Creatinine, Ser: 0.56 mg/dL (ref 0.44–1.00)
GFR calc Af Amer: >60 mL/min (ref >60)
GFR calc non Af Amer: >60 mL/min (ref >60)
Glucose, Bld: 120 mg/dL — ABNORMAL HIGH (ref 65–99)
POTASSIUM: 3.6 mmol/L (ref 3.5–5.1)
SODIUM: 140 mmol/L (ref 135–145)

## 2014-08-23 LAB — CULTURE, ROUTINE-ABSCESS

## 2014-08-23 LAB — HEPATITIS PANEL, ACUTE
HCV AB: 0.6 {s_co_ratio} — AB (ref 0.0–0.9)
Hep A IgM: NEGATIVE — AB
Hep B C IgM: NEGATIVE — AB
Hepatitis B Surface Ag: NEGATIVE — AB

## 2014-08-23 LAB — VANCOMYCIN, TROUGH: Vancomycin Tr: 13 ug/mL (ref 10.0–20.0)

## 2014-08-23 LAB — CBC
HEMATOCRIT: 31.1 % — AB (ref 36.0–46.0)
Hemoglobin: 10.1 g/dL — ABNORMAL LOW (ref 12.0–15.0)
MCH: 33.4 pg (ref 26.0–34.0)
MCHC: 32.5 g/dL (ref 30.0–36.0)
MCV: 103 fL — ABNORMAL HIGH (ref 78.0–100.0)
PLATELETS: 374 10*3/uL (ref 150–400)
RBC: 3.02 MIL/uL — ABNORMAL LOW (ref 3.87–5.11)
RDW: 13.3 % (ref 11.5–15.5)
WBC: 5 10*3/uL (ref 4.0–10.5)

## 2014-08-23 LAB — MAGNESIUM: MAGNESIUM: 1.9 mg/dL (ref 1.7–2.4)

## 2014-08-23 MED ORDER — FOLIC ACID 1 MG PO TABS
1.0000 mg | ORAL_TABLET | Freq: Every day | ORAL | Status: DC
  Administered 2014-08-23 – 2014-08-29 (×7): 1 mg via ORAL
  Filled 2014-08-23 (×7): qty 1

## 2014-08-23 MED ORDER — IOHEXOL 350 MG/ML SOLN
100.0000 mL | Freq: Once | INTRAVENOUS | Status: AC | PRN
  Administered 2014-08-23: 100 mL via INTRAVENOUS

## 2014-08-23 MED ORDER — VITAMIN B-1 100 MG PO TABS
100.0000 mg | ORAL_TABLET | Freq: Every day | ORAL | Status: DC
  Administered 2014-08-23 – 2014-08-29 (×7): 100 mg via ORAL
  Filled 2014-08-23 (×7): qty 1

## 2014-08-23 MED ORDER — POTASSIUM CHLORIDE CRYS ER 20 MEQ PO TBCR
40.0000 meq | EXTENDED_RELEASE_TABLET | Freq: Once | ORAL | Status: AC
  Administered 2014-08-23: 40 meq via ORAL
  Filled 2014-08-23: qty 2

## 2014-08-23 NOTE — Progress Notes (Signed)
PATIENT DETAILS Name: Deanna Ball Age: 54 y.o. Sex: female Date of Birth: [birth year] Admit Date: 08/20/2014 Admitting Physician Ozella Rocks, MD PCP:No primary care provider on file.  Subjective: Continues to have low back pain and pain in her right flank area. Claims to have persistent pleuritic right-sided chest pain since yesterday.  Assessment/Plan: Principal Problem: Abscess right retroperitoneum/perinephric abscess involving the psoas muscle and obstructing the right ureteral pelvic junction causing hydronephrosis: Underwent placement of right percutaneous nephrostomy tube and percutaneous drain into the right psoas/perinephric abscess by interventional radiology on 6/1. Abscess culture, blood culture, urine culture positive for MSSA.  Transthoracic echocardiogram negative for vegetations,will likely need TEE-will speak with cardiology on 6/5 to see if we can arrange for 6/6. MRI of the lower back negative for discitis. Antibiotics tapered to Ancef, await further recommendations from infectious disease.  Active Problems: Sepsis: Secondary to above and gram-positive bacteremia, blood pressure now stable. Cultures and antibiotics as above.  MSSA bacteremia: See above. ID consulted.  Right-sided chest pain:Pleuritic-will check CT Chest. Follow.Supportive care .  Hypokalemia: Repleted  History of lupus: Continue prednisone, hold methotrexate.  History of alcohol abuse: Drinks 2 bottles of wine daily. Last week was 5 days back. No signs of withdrawal currently. Continue close monitoring  GERD: PPI.  Anxiety: Continue as needed Xanax.  Disposition: Remain inpatient-will require several more days of hospitalization-suspect SNF on discharge  Antimicrobial agents  See below  Anti-infectives    Start     Dose/Rate Route Frequency Ordered Stop   08/22/14 1400  ceFAZolin (ANCEF) IVPB 2 g/50 mL premix     2 g 100 mL/hr over 30 Minutes Intravenous 3 times  per day 08/22/14 1209     08/22/14 0200  vancomycin (VANCOCIN) IVPB 1000 mg/200 mL premix  Status:  Discontinued     1,000 mg 200 mL/hr over 60 Minutes Intravenous Every 12 hours 08/21/14 1213 08/23/14 0942   08/21/14 1500  cefTRIAXone (ROCEPHIN) 1 g in dextrose 5 % 50 mL IVPB  Status:  Discontinued     1 g 100 mL/hr over 30 Minutes Intravenous  Once 08/20/14 1702 08/21/14 1206   08/21/14 1300  vancomycin (VANCOCIN) 1,500 mg in sodium chloride 0.9 % 500 mL IVPB     1,500 mg 250 mL/hr over 120 Minutes Intravenous  Once 08/21/14 1213 08/21/14 1700   08/21/14 1230  Ampicillin-Sulbactam (UNASYN) 3 g in sodium chloride 0.9 % 100 mL IVPB  Status:  Discontinued     3 g 100 mL/hr over 60 Minutes Intravenous 4 times per day 08/21/14 1208 08/22/14 1209   08/20/14 2033  ceFAZolin (ANCEF) 2-3 GM-% IVPB SOLR    Comments:  Kevan Ny   : cabinet override      08/20/14 2033 08/20/14 2042   08/20/14 1515  cefTRIAXone (ROCEPHIN) 1 g in dextrose 5 % 50 mL IVPB     1 g 100 mL/hr over 30 Minutes Intravenous  Once 08/20/14 1504 08/20/14 1613      DVT Prophylaxis: Prophylactic  Heparin   Code Status: Full code   Family Communication Mother at bedside  Procedures: Perc Nephrostomy/Abscess drain 6/1  CONSULTS:  ID and urology  Time spent 35 minutes-Greater than 50% of this time was spent in counseling, explanation of diagnosis, planning of further management, and coordination of care.  MEDICATIONS: Scheduled Meds: . ALPRAZolam  1 mg Oral BID  .  ceFAZolin (ANCEF) IV  2 g Intravenous 3 times per day  . Chlorhexidine Gluconate Cloth  6 each Topical Q0600  . folic acid  1 mg Oral Daily  . heparin  5,000 Units Subcutaneous 3 times per day  . mupirocin ointment  1 application Nasal BID  . pantoprazole  40 mg Oral Daily  . polyethylene glycol  17 g Oral Daily  . predniSONE  2.5 mg Oral BID WC  . senna  2 tablet Oral QHS  . sodium chloride  1,000 mL Intravenous Once  . sodium chloride  3 mL  Intravenous Q12H  . thiamine  100 mg Oral Daily   Continuous Infusions: . sodium chloride 75 mL/hr at 08/23/14 1100   PRN Meds:.acetaminophen **OR** acetaminophen, morphine injection, ondansetron **OR** ondansetron (ZOFRAN) IV, oxyCODONE    PHYSICAL EXAM: Vital signs in last 24 hours: Filed Vitals:   08/22/14 0750 08/22/14 2044 08/23/14 0528 08/23/14 1436  BP: 120/65 122/67 114/63 129/78  Pulse: 86 79 72 91  Temp: 98 F (36.7 C) 98.2 F (36.8 C) 97.9 F (36.6 C) 98.3 F (36.8 C)  TempSrc: Oral Oral Oral Oral  Resp:  16 16 16   Height:      Weight:      SpO2: 94% 98% 93% 98%    Weight change:  Filed Weights   08/20/14 1030 08/20/14 1824  Weight: 70.308 kg (155 lb) 71.3 kg (157 lb 3 oz)   Body mass index is 28.27 kg/(m^2).   Gen Exam: Awake and alert with clear speech.  Neck: Supple, No JVD.  Chest: B/L Clear.  No rales/rhochi CVS: S1 S2 Regular Abdomen: soft, BS +, non tender, non distended.  Extremities: no edema, lower extremities warm to touch Neurologic: Non Focal-But with generalized weakness-pain also limits exam Skin: No Rash.   Wounds: N/A.    Intake/Output from previous day:  Intake/Output Summary (Last 24 hours) at 08/23/14 1519 Last data filed at 08/23/14 1255  Gross per 24 hour  Intake    790 ml  Output   1509 ml  Net   -719 ml     LAB RESULTS: CBC  Recent Labs Lab 08/20/14 1140 08/21/14 1225 08/22/14 0511 08/23/14 0419  WBC 16.3* 5.6 7.8 5.0  HGB 12.8 11.7* 11.3* 10.1*  HCT 38.0 35.5* 33.4* 31.1*  PLT 491* 381 387 374  MCV 103.3* 102.9* 100.3* 103.0*  MCH 34.8* 33.9 33.9 33.4  MCHC 33.7 33.0 33.8 32.5  RDW 13.1 13.1 13.0 13.3  LYMPHSABS 1.3  --   --   --   MONOABS 1.0  --   --   --   EOSABS 0.0  --   --   --   BASOSABS 0.0  --   --   --     Chemistries   Recent Labs Lab 08/20/14 1140 08/21/14 1225 08/22/14 0511 08/23/14 0419  NA 136 138 137 140  K 3.8 3.1* 2.9* 3.6  CL 99* 104 101 107  CO2 26 23 25 26   GLUCOSE 108*  93 89 120*  BUN 19 11 9 10   CREATININE 0.85 0.65 0.59 0.56  CALCIUM 9.0 8.2* 8.3* 8.4*  MG  --   --   --  1.9    CBG: No results for input(s): GLUCAP in the last 168 hours.  GFR Estimated Creatinine Clearance: 73.5 mL/min (by C-G formula based on Cr of 0.56).  Coagulation profile  Recent Labs Lab 08/20/14 2005  INR 1.08    Cardiac Enzymes  Recent Labs Lab 08/22/14 [phone]  TROPONINI <0.03    Invalid input(s): POCBNP No results for input(s): DDIMER in the last 72 hours. No results for input(s): HGBA1C in the last 72 hours. No results for input(s): CHOL, HDL, LDLCALC, TRIG, CHOLHDL, LDLDIRECT in the last 72 hours. No results for input(s): TSH, T4TOTAL, T3FREE, THYROIDAB in the last 72 hours.  Invalid input(s): FREET3 No results for input(s): VITAMINB12, FOLATE, FERRITIN, TIBC, IRON, RETICCTPCT in the last 72 hours. No results for input(s): LIPASE, AMYLASE in the last 72 hours.  Urine Studies No results for input(s): UHGB, CRYS in the last 72 hours.  Invalid input(s): UACOL, UAPR, USPG, UPH, UTP, UGL, UKET, UBIL, UNIT, UROB, ULEU, UEPI, UWBC, URBC, UBAC, CAST, UCOM, BILUA  MICROBIOLOGY: Recent Results (from the past 240 hour(s))  Urine culture     Status: None   Collection Time: 08/20/14  1:15 PM  Result Value Ref Range Status   Specimen Description URINE, CLEAN CATCH  Final   Special Requests NONE  Final   Colony Count   Final    >=100,000 COLONIES/ML Performed at [facility]    Culture   Final    STAPHYLOCOCCUS AUREUS Note: RIFAMPIN AND GENTAMICIN SHOULD NOT BE USED AS SINGLE DRUGS FOR TREATMENT OF STAPH INFECTIONS. Performed at [facility]    Report Status 08/22/2014 FINAL  Final   Organism ID, Bacteria STAPHYLOCOCCUS AUREUS  Final      Susceptibility   Staphylococcus aureus - MIC*    GENTAMICIN <=0.5 SENSITIVE Sensitive     LEVOFLOXACIN >=8 RESISTANT Resistant     NITROFURANTOIN <=16 SENSITIVE Sensitive     OXACILLIN 0.5 SENSITIVE  Sensitive     PENICILLIN RESISTANT      RIFAMPIN <=0.5 SENSITIVE Sensitive     TRIMETH/SULFA <=10 SENSITIVE Sensitive     VANCOMYCIN 1 SENSITIVE Sensitive     TETRACYCLINE <=1 SENSITIVE Sensitive     * STAPHYLOCOCCUS AUREUS  Culture, blood (routine x 2)     Status: None   Collection Time: 08/20/14  3:24 PM  Result Value Ref Range Status   Specimen Description BLOOD RIGHT ANTECUBITAL  Final   Special Requests BOTTLES DRAWN AEROBIC AND ANAEROBIC 8CC  Final   Culture   Final    STAPHYLOCOCCUS AUREUS Note: RIFAMPIN AND GENTAMICIN SHOULD NOT BE USED AS SINGLE DRUGS FOR TREATMENT OF STAPH INFECTIONS. SUSCEPTIBILITIES PERFORMED ON PREVIOUS CULTURE WITHIN THE LAST 5 DAYS. Note: Gram Stain Report Called to,Read Back By and Verified With:  ARMSTRONG S @ 0715 08/21/2014 BY RESSEGGER RESISTANT Performed at [facility]    Report Status 08/23/2014 FINAL  Final  Culture, blood (routine x 2)     Status: None   Collection Time: 08/20/14  3:33 PM  Result Value Ref Range Status   Specimen Description BLOOD RIGHT HAND  Final   Special Requests BOTTLES DRAWN AEROBIC ONLY 4CC  Final   Culture   Final    STAPHYLOCOCCUS AUREUS Note: RIFAMPIN AND GENTAMICIN SHOULD NOT BE USED AS SINGLE DRUGS FOR TREATMENT OF STAPH INFECTIONS. Note: Gram Stain Report Called to,Read Back By and Verified With: ARMSTRONG S [facility]) AT 0715 ON 08/21/2014 BY RESSEGGER R Performed at [facility]    Report Status 08/23/2014 FINAL  Final   Organism ID, Bacteria STAPHYLOCOCCUS AUREUS  Final      Susceptibility   Staphylococcus aureus - MIC*    CLINDAMYCIN <=0.25 SENSITIVE Sensitive     ERYTHROMYCIN <=0.25 SENSITIVE Sensitive     GENTAMICIN <=0.5 SENSITIVE Sensitive  LEVOFLOXACIN >=8 RESISTANT Resistant     OXACILLIN 0.5 SENSITIVE Sensitive     PENICILLIN RESISTANT      RIFAMPIN <=0.5 SENSITIVE Sensitive     TRIMETH/SULFA <=10 SENSITIVE Sensitive     VANCOMYCIN <=0.5 SENSITIVE Sensitive     TETRACYCLINE <=1  SENSITIVE Sensitive     MOXIFLOXACIN 4 RESISTANT Resistant     * STAPHYLOCOCCUS AUREUS  MRSA PCR Screening     Status: Abnormal   Collection Time: 08/20/14  7:48 PM  Result Value Ref Range Status   MRSA by PCR POSITIVE (A) NEGATIVE Final    Comment:        The GeneXpert MRSA Assay (FDA approved for NASAL specimens only), is one component of a comprehensive MRSA colonization surveillance program. It is not intended to diagnose MRSA infection nor to guide or monitor treatment for MRSA infections. RESULT CALLED TO, READ BACK BY AND VERIFIED WITH: ARMSTRONG,S RN [phone] [id number] COVINGTON,N RESULT CALLED TO, READ BACK BY AND VERIFIED WITH: ARMSTRONG,S RN [phone] [id number] C[place],N RESULT CALLED TO, READ BACK BY AND VERIFIED WITH: ARSTRONG,S RN [id number] COVINGTON,N   Culture, routine-abscess     Status: None   Collection Time: 08/20/14 10:16 PM  Result Value Ref Range Status   Specimen Description ABSCESS  Final   Special Requests NONE  Final   Gram Stain   Final    MODERATE WBC PRESENT,BOTH PMN AND MONONUCLEAR NO SQUAMOUS EPITHELIAL CELLS SEEN MODERATE GRAM POSITIVE COCCI IN PAIRS IN CLUSTERS Performed at [facility]    Culture   Final    ABUNDANT STAPHYLOCOCCUS AUREUS Note: RIFAMPIN AND GENTAMICIN SHOULD NOT BE USED AS SINGLE DRUGS FOR TREATMENT OF STAPH INFECTIONS. Performed at [facility]    Report Status 08/23/2014 FINAL  Final   Organism ID, Bacteria STAPHYLOCOCCUS AUREUS  Final      Susceptibility   Staphylococcus aureus - MIC*    CLINDAMYCIN <=0.25 SENSITIVE Sensitive     ERYTHROMYCIN <=0.25 SENSITIVE Sensitive     GENTAMICIN <=0.5 SENSITIVE Sensitive     LEVOFLOXACIN >=8 RESISTANT Resistant     OXACILLIN 0.5 SENSITIVE Sensitive     PENICILLIN 0.12 SENSITIVE Sensitive     RIFAMPIN <=0.5 SENSITIVE Sensitive     TRIMETH/SULFA <=10 SENSITIVE Sensitive     VANCOMYCIN 1 SENSITIVE Sensitive     TETRACYCLINE <=1 SENSITIVE Sensitive     MOXIFLOXACIN 4  RESISTANT Resistant     * ABUNDANT STAPHYLOCOCCUS AUREUS  Anaerobic culture     Status: None (Preliminary result)   Collection Time: 08/20/14 10:16 PM  Result Value Ref Range Status   Specimen Description ABSCESS  Final   Special Requests NONE  Final   Gram Stain   Final    MODERATE WBC PRESENT,BOTH PMN AND MONONUCLEAR NO SQUAMOUS EPITHELIAL CELLS SEEN MODERATE GRAM POSITIVE COCCI IN PAIRS IN CLUSTERS Performed at [facility]    Culture   Final    NO ANAEROBES ISOLATED; CULTURE IN PROGRESS FOR 5 DAYS Performed at [facility]    Report Status PENDING  Incomplete  Blood Cultures x 2 sites     Status: None (Preliminary result)   Collection Time: 08/21/14  4:50 PM  Result Value Ref Range Status   Specimen Description BLOOD RIGHT HAND  Final   Special Requests BOTTLES DRAWN AEROBIC ONLY 10CC  Final   Culture   Final           BLOOD CULTURE RECEIVED NO GROWTH TO DATE CULTURE WILL BE HELD  FOR 5 DAYS BEFORE ISSUING A FINAL NEGATIVE REPORT Performed at [facility]    Report Status PENDING  Incomplete  Blood Cultures x 2 sites     Status: None (Preliminary result)   Collection Time: 08/21/14  5:05 PM  Result Value Ref Range Status   Specimen Description BLOOD RIGHT ARM  Final   Special Requests BOTTLES DRAWN AEROBIC AND ANAEROBIC 10CC  Final   Culture   Final           BLOOD CULTURE RECEIVED NO GROWTH TO DATE CULTURE WILL BE HELD FOR 5 DAYS BEFORE ISSUING A FINAL NEGATIVE REPORT Performed at [facility]    Report Status PENDING  Incomplete    RADIOLOGY STUDIES/RESULTS: Dg Cervical Spine Complete  08/22/2014   CLINICAL DATA:  Rheumatoid arthritis, potential planning for transesophageal echocardiography, history lupus  EXAM: CERVICAL SPINE  4+ VIEWS  COMPARISON:  None  FINDINGS: Straightening of cervical lordosis question muscle spasm.  Prevertebral soft tissues normal to upper normal in thickness.  Vertebral body and disc space heights maintained.   No acute fracture, subluxation or bone destruction.  Predental space normal.  Osseous neural foramina grossly patent.  Lung apices clear.  Facet alignments normal.  C1-C2 alignment normal.  IMPRESSION: No acute abnormalities.   Electronically Signed   By: Ulyses Southward M.D.   On: 08/22/2014 14:44   Mr Lumbar Spine W Wo Contrast  08/22/2014   CLINICAL DATA:  Retroperitoneal abscess. 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  EXAM: MRI LUMBAR SPINE WITHOUT AND WITH CONTRAST  TECHNIQUE: Multiplanar and multiecho pulse sequences of the lumbar spine were obtained without and with intravenous contrast.  CONTRAST:  36mL MULTIHANCE GADOBENATE DIMEGLUMINE 529 MG/ML IV SOLN  COMPARISON:  CT 08/20/2014.  FINDINGS: Segmentation: The numbering convention used for this exam termed L5-S1 as the last intervertebral disc space. Numbering was correlated with prior CT.  Alignment: Grade I anterolisthesis of L5 on S1 is a chronic finding and probably associated with old pars defects. Decompression with solid posterior lateral fusion.  Vertebrae: Benign hemangioma present at T12. Degenerative endplate changes are present at L1-L2. L4 inferior endplate edema appears discogenic and shows mild post gadolinium enhancement. This is unlikely to represent discogenic infection given the degenerated disc and vacuum disc seen on prior CT 08/20/2014.  Conus medullaris: Normal termination at L1.  Paraspinal tissues: RIGHT nephrostomy and RIGHT sella is drains noted. No definite interval change compared to prior abdominal CT.  Disc levels:  Disc Signal: Disc degeneration at L4-L5. Partial ossification of the L5-S1 disc associated with posterior lateral fusion and posterior decompression.  L1-L2:  Negative.  L2-L3:  Facet hypertrophy without stenosis.  Normal disc.  L3-L4:  Mild facet degeneration.  No stenosis.  L4-L5: There is distraction of the LEFT facet joint with some fluid in the joint and enhancing granulation tissue or synovitis in  the posterior aspect of the joint. The joint is degenerated and given the distraction, this is most compatible with ordinary degenerative changes and facet osteoarthritis. Infection of the facet joint is considered unlikely. There is little if any periarticular edema around the LEFT L4-L5 facet joint. The RIGHT facet joint appears normal. An isolated septic facet arthritis even in the setting of psoas abscess is unlikely although there is imaging overlap between the appearance is of infection and degenerative disease.  Disc degeneration. Shallow broad-based posterior disc protrusion superimposed on disc bulging. Bilateral L4 laminotomies have been performed and central canal is decompressed. There is bilateral subarticular  stenosis. LEFT foraminal stenosis is moderate, associated with degenerative disc and facet disease.  L5-S1: Partial ossification of the disc. Solid posterior lateral fusion and laminectomy with good decompression of the central canal.  IMPRESSION: 1. No convincing findings of spinal infection in this patient with psoas abscess and drainage. Mild inflammatory changes are present at the LEFT L4-L5 facet joint that are more compatible with ordinary degenerative disease than septic arthritis. Although there is imaging overlap between the appearances, septic arthritis is unlikely. 2. Solid L5-S1 fusion and decompression. 3. 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   Electronically Signed   By: Andreas Newport M.D.   On: 08/22/2014 08:25   Ct Abdomen Pelvis W Contrast  08/20/2014   CLINICAL DATA:  Right upper quadrant abdominal pain for 1 week with nausea and vomiting. History of appendectomy, hernia repair and systemic lupus erythematosus. Moderate leukocytosis. Initial encounter.  EXAM: CT ABDOMEN AND PELVIS WITH CONTRAST  TECHNIQUE: Multidetector CT imaging of the abdomen and pelvis was performed using the  standard protocol following bolus administration of intravenous contrast.  CONTRAST:  100 ml Omnipaque 300  COMPARISON:  Ultrasound same date.  FINDINGS: Minimal dependent atelectasis at both lung bases. Otherwise clear. No pleural or pericardial effusion.  Lower chest: Mildly decreased hepatic density suspicious for steatosis. No focal lesions identified.  Hepatobiliary: The liver is normal in density without focal abnormality. No evidence of gallstones, gallbladder wall thickening or biliary dilatation.  Pancreas: Unremarkable. No pancreatic ductal dilatation or surrounding inflammatory changes.  Spleen: Normal in size without focal abnormality.  Adrenals/Urinary Tract: Both adrenal glands appear normal.The left kidney appears normal. The right kidney demonstrates hydronephrosis and delayed contrast excretion. There is asymmetric perinephric soft tissue stranding on the right. There is a complex fluid collection or centrally necrotic mass adjacent to the ureteropelvic junction, measuring 5.6 x 3.2 cm transverse on image 46. This extends into the right psoas muscle. No urinary tract calculi demonstrated. The distal ureters and bladder appear unremarkable.  Stomach/Bowel: No evidence of bowel wall thickening, distention or surrounding inflammatory change.Mild sigmoid colon diverticulosis without surrounding inflammation.  Vascular/Lymphatic: There are several mildly prominent retroperitoneal lymph nodes, likely reactive. No mesenteric adenopathy. Mild aortoiliac atherosclerosis. The IVC and renal veins appear patent.  Reproductive: The uterus appears unremarkable.  No adnexal mass.  Other: No evidence of abdominal wall mass or hernia.  Musculoskeletal: No acute or significant osseous findings. Mild lumbar spondylosis noted. There are postsurgical changes at the lumbosacral junction with a grade 1 anterolisthesis. Moderate facet hypertrophy noted at L4-5. The paraspinal fat planes are maintained. No evidence of  discitis or osteomyelitis P  IMPRESSION: 1. Suspected abscess within the right retroperitoneum involving the psoas muscle and obstructing the right ureteral pelvic junction. This is an atypical location, and necrotic neoplasm cannot be completely excluded. No other cause for ureteral obstruction identified. 2. Small retroperitoneal lymph nodes are probably reactive. No other suspicious masses identified. 3. No evidence of urinary tract calculus. 4. Urology consultation recommended. Results were discussed by telephone with Dr. Fayrene Fearing.   Electronically Signed   By: Carey Bullocks M.D.   On: 08/20/2014 14:38   Ir Nephrostomy Placement Right  08/21/2014   CLINICAL DATA:  54 year old female with sepsis secondary to right psoas/ perinephric abscess. This abscess collection results in at least partial obstruction of the proximal ureter and right hydronephrosis. Additionally, there is clinical concern for possible pyonephrosis of the obstructed kidney. Therefore, percutaneous nephrostomy  tube placement is warranted. This will then be followed by percutaneous drainage of the psoas/perinephric abscess.  EXAM: IR NEPHROSTOMY PLACEMENT RIGHT  Date: 08/21/2014  PROCEDURE: 1. Ultrasound-guided puncture right renal collecting system 2. Placement of a 10 French percutaneous nephrostomy tube using fluoroscopic guidance Interventional Radiologist:  Sterling Big, MD  ANESTHESIA/SEDATION: Moderate (conscious) sedation was used. 2 mg Versed, 100 mcg Fentanyl were administered intravenously. The patient's vital signs were monitored continuously by radiology nursing throughout the procedure.  Sedation Time: 25 minutes  MEDICATIONS: 2 g Ancef administered intravenously  FLUOROSCOPY TIME:  4 minutes 30 seconds  81 mGy  CONTRAST:  76mL OMNIPAQUE IOHEXOL 300 MG/ML  SOLN  TECHNIQUE: Informed consent was obtained from the patient following explanation of the procedure, risks, benefits and alternatives. The patient understands, agrees and  consents for the procedure. All questions were addressed. A time out was performed.  Maximal barrier sterile technique utilized including caps, mask, sterile gowns, sterile gloves, large sterile drape, hand hygiene, and Betadine skin prep.  The right flank was interrogated with ultrasound. A suitable posterior inferior calyx was identified. Local anesthesia was attained by infiltration with 1% lidocaine. A small dermatotomy was made. Using real-time ultrasound guidance, the posterior inferior calyx was punctured with a 21 gauge Accustick needle. Free return of turbid urine from the needle once the stylet was removed confirmed its location within the collecting system. A gentle hand injection of contrast material opacified the selected lower pole calyx and infundibulum.  A night tracks wire was then advanced into the renal pelvis. The Accustick needle was exchanged for the Accustick sheath. A Bentson wire was then coiled in the renal pelvis and the Accustick sheath removed. The skin tract was dilated to 10 [place] and a [place] all-purpose drainage catheter was then advanced over the wire and formed with the locking loop in the renal pelvis. A final image was obtained after confirming location within the renal pelvis by gentle hand injection of contrast.  The nephrostomy tube was then secured to the skin with 0 Prolene suture and a sterile bandage. The catheter was connected to gravity bag drainage. The patient tolerated the procedure well.  COMPLICATIONS: None  IMPRESSION: Technically successful placement of a 10 French right-sided percutaneous nephrostomy tube.  Signed,  Sterling Big, MD  [facility]  [facility]   Electronically Signed   By: Malachy Moan M.D.   On: 08/21/2014 16:52   Ct Image Guided Drainage By Percutaneous Catheter  08/21/2014   CLINICAL DATA:  54 year old female with a right retroperitoneal abscess and associated hydronephrosis.  CT-guided drain placement is warranted in the setting of sepsis.  EXAM: CT IMAGE GUIDED DRAINAGE BY PERCUTANEOUS CATHETER  Date: 08/21/2014  PROCEDURE: 1. CT-guided drain placement in the right psoas muscle/perinephric space Interventional Radiologist:  Sterling Big, MD  ANESTHESIA/SEDATION: Moderate (conscious) sedation was used. 0.5 mg Versed, 25 mcg Fentanyl were administered intravenously. The patient's vital signs were monitored continuously by radiology nursing throughout the procedure.  Sedation Time: 15 minutes  MEDICATIONS: 2 g Ancef given within 1 hour of skin incision  TECHNIQUE: Informed consent was obtained from the patient following explanation of the procedure, risks, benefits and alternatives. The patient understands, agrees and consents for the procedure. All questions were addressed. A time out was performed.  A planning axial CT scan was performed. The fluid collection within the right psoas musculature was successfully identified. A suitable skin entry site was selected and marked. The region  was then sterilely prepped and draped in standard fashion using Betadine skin prep. Local anesthesia was attained by infiltration with 1% lidocaine. A small dermatotomy was made. Under intermittent CT fluoroscopic guidance, an 18 gauge introducer needle was advanced into the fluid collection. An Amplatz wire was then coiled within the fluid collection in the skin tract dilated to 12 [place]. A Cook 12 [place] all-purpose drainage catheter was then advanced over the wire and formed within the fluid collection. Aspiration yields 20 mL of thick, frankly purulent material. A sample was sent for Gram stain and culture.  Axial CT imaging was then performed confirming good positioning of the drainage catheter. The fluid collection has notably decreased in size. The drainage tube was then secured to the skin with 0 Prolene suture and an adhesive fixation device. The tube was connected to JP bulb suction.   COMPLICATIONS: None  IMPRESSION: Successful placement of a 12 French percutaneous drainage catheter in the right psoas/perinephric fluid collection.  Aspiration yields 20 mL thick, frankly purulent material. Sample was sent for culture.  PLAN: Once drainage has been minimal (less than 20 mL) for several days, recommend repeat CT scan with contrast to ensure resolution of the abscess prior to tube removal.  Signed,  Sterling Big, MD  [facility]  [facility]   Electronically Signed   By: Malachy Moan M.D.   On: 08/21/2014 18:00   US Abdomen Limited Ruq  08/20/2014   CLINICAL DATA:  Sharp RIGHT upper quadrant pain for 1 week, history lupus, rheumatoid arthritis  EXAM: US ABDOMEN LIMITED - RIGHT UPPER QUADRANT  COMPARISON:  None  FINDINGS: Gallbladder:  Minimal dependent sludge in gallbladder. No shadowing gallstones, gallbladder wall thickening or pericholecystic fluid.  Common bile duct:  Diameter: Normal caliber 3 mm diameter  Liver:  Echogenic, likely fatty infiltration, though this can be seen with cirrhosis and certain infiltrative disorders. No focal hepatic mass or nodularity. Hepatopetal portal venous flow.  No RIGHT upper quadrant free fluid.  IMPRESSION: Probable fatty infiltration of liver as above.  Minimal gallbladder sludge.  Otherwise negative exam.   Electronically Signed   By: Ulyses Southward M.D.   On: 08/20/2014 13:04    Jeoffrey Massed, MD  [facility] Pager:[phone]  If 7PM-7AM, please contact night-coverage [email or web address] Password [email or web address] 08/23/2014, 3:19 PM   LOS: 3 days

## 2014-08-23 NOTE — Progress Notes (Signed)
Subjective:  Deanna Ball is a 54 y.o. female with RA. Alchollsm, Perinephric abscess with extension into psoas muscle, ureteral obstruction sp IR drainage with Staphylococcus Aureus growing from abscess, urine and looking to be recovered from blood. Pt seen by Dr. Rebeca Allegra 06/01: Principal Problem:  Perinephric abscess Active Problems:  Pyelonephritis  Hydronephrosis  Lupus  Rheumatoid arthritis  Anxiety  GERD without esophagitis  Alcohol abuse  Transaminitis  Abscess  Ureteral obstruction  Screening for STD (sexually transmitted disease)  Psoas abscess.  Now  Has perc drain in psoas abscess and in kidney.   Objective: Vital signs in last 24 hours: Temp:  [97.9 F (36.6 C)-98.2 F (36.8 C)] 97.9 F (36.6 C) (06/04 0528) Pulse Rate:  [72-79] 72 (06/04 0528) Resp:  [16] 16 (06/04 0528) BP: (114-122)/(63-67) 114/63 mmHg (06/04 0528) SpO2:  [93 %-98 %] 93 % (06/04 0528)A  Intake/Output from previous day: 06/03 0701 - 06/04 0700 In: 1030 [P.O.:480; IV Piggyback:550] Out: 1755 [Urine:1750; Drains:5] Intake/Output this shift:    Past Medical History  Diagnosis Date  . Lupus   . Rheumatoid arthritis     Physical Exam:  Lungs - Normal respiratory effort, chest expands symmetrically.  Abdomen - Soft, non-tender & non-distended.  Lab Results:  Recent Labs  08/21/14 1225 08/22/14 0511 08/23/14 0419  WBC 5.6 7.8 5.0  HGB 11.7* 11.3* 10.1*  HCT 35.5* 33.4* 31.1*   BMET  Recent Labs  08/22/14 0511 08/23/14 0419  NA 137 140  K 2.9* 3.6  CL 101 107  CO2 25 26  GLUCOSE 89 120*  BUN 9 10  CREATININE 0.59 0.56  CALCIUM 8.3* 8.4*   No results for input(s): LABURIN in the last 72 hours. Results for orders placed or performed during the hospital encounter of 08/20/14  Urine culture     Status: None   Collection Time: 08/20/14  1:15 PM  Result Value Ref Range Status   Specimen Description URINE, CLEAN CATCH  Final   Special Requests NONE   Final   Colony Count   Final    >=100,000 COLONIES/ML Performed at [facility]    Culture   Final    STAPHYLOCOCCUS AUREUS Note: RIFAMPIN AND GENTAMICIN SHOULD NOT BE USED AS SINGLE DRUGS FOR TREATMENT OF STAPH INFECTIONS. Performed at [facility]    Report Status 08/22/2014 FINAL  Final   Organism ID, Bacteria STAPHYLOCOCCUS AUREUS  Final      Susceptibility   Staphylococcus aureus - MIC*    GENTAMICIN <=0.5 SENSITIVE Sensitive     LEVOFLOXACIN >=8 RESISTANT Resistant     NITROFURANTOIN <=16 SENSITIVE Sensitive     OXACILLIN 0.5 SENSITIVE Sensitive     PENICILLIN RESISTANT      RIFAMPIN <=0.5 SENSITIVE Sensitive     TRIMETH/SULFA <=10 SENSITIVE Sensitive     VANCOMYCIN 1 SENSITIVE Sensitive     TETRACYCLINE <=1 SENSITIVE Sensitive     * STAPHYLOCOCCUS AUREUS  Culture, blood (routine x 2)     Status: None (Preliminary result)   Collection Time: 08/20/14  3:24 PM  Result Value Ref Range Status   Specimen Description BLOOD RIGHT ANTECUBITAL  Final   Special Requests BOTTLES DRAWN AEROBIC AND ANAEROBIC 8CC  Final   Culture   Final    GRAM POSITIVE COCCI IN CLUSTERS Note: Gram Stain Report Called to,Read Back By and Verified With:  ARMSTRONG S @ 0715 08/21/2014 BY RESSEGGER RESISTANT Performed at [facility]    Report Status PENDING  Incomplete  Culture, blood (routine x 2)     Status: None (Preliminary result)   Collection Time: 08/20/14  3:33 PM  Result Value Ref Range Status   Specimen Description BLOOD RIGHT HAND  Final   Special Requests BOTTLES DRAWN AEROBIC ONLY 4CC  Final   Culture   Final    GRAM POSITIVE COCCI IN CLUSTERS Note: Gram Stain Report Called to,Read Back By and Verified With: ARMSTRONG S [facility]) AT 0715 ON 08/21/2014 BY RESSEGGER R Performed at [facility]    Report Status PENDING  Incomplete  MRSA PCR Screening     Status: Abnormal   Collection Time: 08/20/14  7:48 PM  Result Value Ref Range Status   MRSA by PCR  POSITIVE (A) NEGATIVE Final    Comment:        The GeneXpert MRSA Assay (FDA approved for NASAL specimens only), is one component of a comprehensive MRSA colonization surveillance program. It is not intended to diagnose MRSA infection nor to guide or monitor treatment for MRSA infections. RESULT CALLED TO, READ BACK BY AND VERIFIED WITH: ARMSTRONG,S RN [phone] [id number] [place],N RESULT CALLED TO, READ BACK BY AND VERIFIED WITH: ARMSTRONG,S RN [phone] [id number] C[place],N RESULT CALLED TO, READ BACK BY AND VERIFIED WITH: ARSTRONG,S RN [id number] COVINGTON,N   Culture, routine-abscess     Status: None (Preliminary result)   Collection Time: 08/20/14 10:16 PM  Result Value Ref Range Status   Specimen Description ABSCESS  Final   Special Requests NONE  Final   Gram Stain   Final    MODERATE WBC PRESENT,BOTH PMN AND MONONUCLEAR NO SQUAMOUS EPITHELIAL CELLS SEEN MODERATE GRAM POSITIVE COCCI IN PAIRS IN CLUSTERS Performed at [facility]    Culture   Final    ABUNDANT STAPHYLOCOCCUS AUREUS Note: RIFAMPIN AND GENTAMICIN SHOULD NOT BE USED AS SINGLE DRUGS FOR TREATMENT OF STAPH INFECTIONS. Performed at [facility]    Report Status PENDING  Incomplete  Anaerobic culture     Status: None (Preliminary result)   Collection Time: 08/20/14 10:16 PM  Result Value Ref Range Status   Specimen Description ABSCESS  Final   Special Requests NONE  Final   Gram Stain   Final    MODERATE WBC PRESENT,BOTH PMN AND MONONUCLEAR NO SQUAMOUS EPITHELIAL CELLS SEEN MODERATE GRAM POSITIVE COCCI IN PAIRS IN CLUSTERS Performed at [facility]    Culture   Final    NO ANAEROBES ISOLATED; CULTURE IN PROGRESS FOR 5 DAYS Performed at [facility]    Report Status PENDING  Incomplete  Blood Cultures x 2 sites     Status: None (Preliminary result)   Collection Time: 08/21/14  4:50 PM  Result Value Ref Range Status   Specimen Description BLOOD RIGHT HAND  Final   Special  Requests BOTTLES DRAWN AEROBIC ONLY 10CC  Final   Culture   Final           BLOOD CULTURE RECEIVED NO GROWTH TO DATE CULTURE WILL BE HELD FOR 5 DAYS BEFORE ISSUING A FINAL NEGATIVE REPORT Performed at [facility]    Report Status PENDING  Incomplete  Blood Cultures x 2 sites     Status: None (Preliminary result)   Collection Time: 08/21/14  5:05 PM  Result Value Ref Range Status   Specimen Description BLOOD RIGHT ARM  Final   Special Requests BOTTLES DRAWN AEROBIC AND ANAEROBIC 10CC  Final   Culture   Final  BLOOD CULTURE RECEIVED NO GROWTH TO DATE CULTURE WILL BE HELD FOR 5 DAYS BEFORE ISSUING A FINAL NEGATIVE REPORT Performed at [facility]    Report Status PENDING  Incomplete    Studies/Results: Dg Cervical Spine Complete  08/22/2014   CLINICAL DATA:  Rheumatoid arthritis, potential planning for transesophageal echocardiography, history lupus  EXAM: CERVICAL SPINE  4+ VIEWS  COMPARISON:  None  FINDINGS: Straightening of cervical lordosis question muscle spasm.  Prevertebral soft tissues normal to upper normal in thickness.  Vertebral body and disc space heights maintained.  No acute fracture, subluxation or bone destruction.  Predental space normal.  Osseous neural foramina grossly patent.  Lung apices clear.  Facet alignments normal.  C1-C2 alignment normal.  IMPRESSION: No acute abnormalities.   Electronically Signed   By: Ulyses Southward M.D.   On: 08/22/2014 14:44    Assessment: Stable today on antibiotics per ID.   Plan: Urology to follow as needed.   Rashawnda Gaba I Yadier Bramhall 08/23/2014, 7:54 AM

## 2014-08-23 NOTE — Evaluation (Signed)
Physical Therapy Evaluation Patient Details Name: Deanna Ball MRN: [id number] DOB: 1960-04-27 Today's Date: 08/23/2014   History of Present Illness  Pt is a 54 year old female admitted for abscess right retroperitoneum/perinephric abscess involving the psoas muscle and obstructing the right ureteral pelvic junction causing hydronephrosis and underwent placement of right percutaneous nephrostomy tube and percutaneous drain into the right psoas/perinephric abscess by interventional radiology on 6/1 with hx of lupus and RA.  Clinical Impression  Pt admitted with above diagnosis. Pt currently with functional limitations due to the deficits listed below (see PT Problem List).  Pt will benefit from skilled PT to increase their independence and safety with mobility to allow discharge to the venue listed below.   Pt reporting pain and weakness especially in R leg with mobility today and only able to tolerate a few steps due to dizziness.  Pt encouraged to get OOB to [facility] and recliner with nursing to start progressing mobility and improving strength as pt reports not being OOB in 4-5 days.  Currently recommend SNF however may progress to home, will keep recommendations updated.     Follow Up Recommendations SNF;Supervision for mobility/OOB    Equipment Recommendations  None recommended by PT (TBA if home)    Recommendations for Other Services       Precautions / Restrictions Precautions Precautions: Fall Precaution Comments: R posterior PCN and JP drains Restrictions Weight Bearing Restrictions: No      Mobility  Bed Mobility Overal bed mobility: Modified Independent             General bed mobility comments: increased time and effort with HOB elevated and using L rail (towards L side due to R drains)  Transfers Overall transfer level: Needs assistance   Transfers: Sit to/from Stand;Stand Pivot Transfers Sit to Stand: Min assist Stand pivot transfers: Min assist       General  transfer comment: verbal cues for safety, slight assist to steady, pivoted to [facility]  Ambulation/Gait Ambulation/Gait assistance: Min guard Ambulation Distance (Feet): 6 Feet   Gait Pattern/deviations: Step-through pattern;Trunk flexed;Narrow base of support;Decreased stride length     General Gait Details: pt used IV pole for support, only able to tolerate a few feet due to dizziness so recliner behind pt for safety, pt reports not being OOB in 4-5 days  Stairs            Wheelchair Mobility    Modified Rankin (Stroke Patients Only)       Balance                                             Pertinent Vitals/Pain Pain Assessment: 0-10 Pain Score: 8  Pain Location: R posterior flank mostly around drain sites Pain Descriptors / Indicators: Aching;Sore Pain Intervention(s): Limited activity within patient's tolerance;Monitored during session;Patient requesting pain meds-RN notified;Repositioned    Home Living Family/patient expects to be discharged to:: Private residence Living Arrangements: Alone           Home Layout: One level Home Equipment: None      Prior Function Level of Independence: Independent               Hand Dominance        Extremity/Trunk Assessment               Lower Extremity Assessment: Generalized weakness;RLE deficits/detail RLE Deficits /  Details: pt reports pain in groin and lateral hip area as well difficulty with lifting leg (hip flexion) likely from abscess involving psoas muscle       Communication   Communication: No difficulties  Cognition Arousal/Alertness: Awake/alert Behavior During Therapy: WFL for tasks assessed/performed Overall Cognitive Status: Within Functional Limits for tasks assessed                      General Comments      Exercises        Assessment/Plan    PT Assessment Patient needs continued PT services  PT Diagnosis Difficulty walking;Generalized  weakness   PT Problem List Decreased strength;Decreased activity tolerance;Decreased mobility;Decreased knowledge of use of DME  PT Treatment Interventions DME instruction;Gait training;Functional mobility training;Patient/family education;Therapeutic activities;Therapeutic exercise   PT Goals (Current goals can be found in the Care Plan section) Acute Rehab PT Goals PT Goal Formulation: With patient Time For Goal Achievement: 09/06/14 Potential to Achieve Goals: Good    Frequency Min 3X/week   Barriers to discharge        Co-evaluation               End of Session   Activity Tolerance: Patient limited by fatigue;Patient limited by pain Patient left: in chair;with call bell/phone within reach;with chair alarm set Nurse Communication: Mobility status;Patient requests pain meds         Time: 8828-0034 PT Time Calculation (min) (ACUTE ONLY): 23 min   Charges:   PT Evaluation $Initial PT Evaluation Tier I: 1 Procedure     PT G Codes:        Lisa Blakeman,KATHrine E 08/23/2014, 9:54 AM Zenovia Jarred, PT, DPT 08/23/2014 Pager: [phone]

## 2014-08-24 DIAGNOSIS — R079 Chest pain, unspecified: Secondary | ICD-10-CM | POA: Insufficient documentation

## 2014-08-24 LAB — CBC
HEMATOCRIT: 32 % — AB (ref 36.0–46.0)
Hemoglobin: 10.6 g/dL — ABNORMAL LOW (ref 12.0–15.0)
MCH: 34.1 pg — AB (ref 26.0–34.0)
MCHC: 33.1 g/dL (ref 30.0–36.0)
MCV: 102.9 fL — ABNORMAL HIGH (ref 78.0–100.0)
PLATELETS: 450 10*3/uL — AB (ref 150–400)
RBC: 3.11 MIL/uL — ABNORMAL LOW (ref 3.87–5.11)
RDW: 13.4 % (ref 11.5–15.5)
WBC: 5.5 10*3/uL (ref 4.0–10.5)

## 2014-08-24 LAB — BASIC METABOLIC PANEL
Anion gap: 10 (ref 5–15)
BUN: 7 mg/dL (ref 6–20)
CALCIUM: 8.6 mg/dL — AB (ref 8.9–10.3)
CHLORIDE: 106 mmol/L (ref 101–111)
CO2: 27 mmol/L (ref 22–32)
Creatinine, Ser: 0.47 mg/dL (ref 0.44–1.00)
GFR calc Af Amer: >60 mL/min (ref >60)
GLUCOSE: 95 mg/dL (ref 65–99)
POTASSIUM: 3.6 mmol/L (ref 3.5–5.1)
Sodium: 143 mmol/L (ref 135–145)

## 2014-08-24 MED ORDER — MORPHINE SULFATE 4 MG/ML IJ SOLN
4.0000 mg | INTRAMUSCULAR | Status: DC | PRN
  Administered 2014-08-24 – 2014-08-29 (×22): 4 mg via INTRAVENOUS
  Filled 2014-08-24 (×22): qty 1

## 2014-08-24 MED ORDER — ACETAMINOPHEN 500 MG PO TABS
1000.0000 mg | ORAL_TABLET | Freq: Three times a day (TID) | ORAL | Status: DC
  Administered 2014-08-24 – 2014-08-29 (×14): 1000 mg via ORAL
  Filled 2014-08-24 (×15): qty 2

## 2014-08-24 NOTE — Progress Notes (Signed)
PATIENT DETAILS Name: Deanna Ball Age: 54 y.o. Sex: female Date of Birth: September 21, 1960 Admit Date: 08/20/2014 Admitting Physician Ozella Rocks, MD PCP:No primary care provider on file.  Brief Narrative: 54 yo female with hx of SLE on chronic prednisone/MTX admitted with worsening right sided flank pain. Found to have right perinephric abscess causing right sided hydronephroses and MSSA bacteremia. ID following.   Subjective: Continues to have low back pain and pain in her right flank area.   Assessment/Plan: Principal Problem: Abscess right retroperitoneum/perinephric abscess involving the psoas muscle and obstructing the right ureteral pelvic junction causing hydronephrosis: Underwent placement of right percutaneous nephrostomy tube and percutaneous drain into the right psoas/perinephric abscess by interventional radiology on 6/1. Abscess culture, blood culture, urine culture positive for MSSA.  Transthoracic echocardiogram negative for vegetations,spoke with cards for a TEE-keep NPO post midnight. Antibiotics tapered to Ancef, await further recommendations from infectious disease.Repeat blood culture on 6/2 negative so far, will place PICC prior to discharge.  Active Problems: Sepsis: Secondary to above and MSSA bacteremia, blood pressure now stable. Cultures and antibiotics as above.  MSSA bacteremia: See above. ID consulted.  Right-sided chest pain:Pleuritic-CT Chest negative for PE. Follow.Supportive care .  Hypokalemia: Repleted  History of lupus: Continue prednisone, hold methotrexate.  History of alcohol abuse: Drinks 2 bottles of wine daily. Last week was 5 days back. No signs of withdrawal currently. Continue close monitoring  GERD: PPI.  Anxiety: Continue as needed Xanax.  Disposition: Remain inpatient-will require several more days of hospitalization-suspect SNF on discharge  Antimicrobial agents  See below  Anti-infectives    Start      Dose/Rate Route Frequency Ordered Stop   08/22/14 1400  ceFAZolin (ANCEF) IVPB 2 g/50 mL premix     2 g 100 mL/hr over 30 Minutes Intravenous 3 times per day 08/22/14 1209     08/22/14 0200  vancomycin (VANCOCIN) IVPB 1000 mg/200 mL premix  Status:  Discontinued     1,000 mg 200 mL/hr over 60 Minutes Intravenous Every 12 hours 08/21/14 1213 08/23/14 0942   08/21/14 1500  cefTRIAXone (ROCEPHIN) 1 g in dextrose 5 % 50 mL IVPB  Status:  Discontinued     1 g 100 mL/hr over 30 Minutes Intravenous  Once 08/20/14 1702 08/21/14 1206   08/21/14 1300  vancomycin (VANCOCIN) 1,500 mg in sodium chloride 0.9 % 500 mL IVPB     1,500 mg 250 mL/hr over 120 Minutes Intravenous  Once 08/21/14 1213 08/21/14 1700   08/21/14 1230  Ampicillin-Sulbactam (UNASYN) 3 g in sodium chloride 0.9 % 100 mL IVPB  Status:  Discontinued     3 g 100 mL/hr over 60 Minutes Intravenous 4 times per day 08/21/14 1208 08/22/14 1209   08/20/14 2033  ceFAZolin (ANCEF) 2-3 GM-% IVPB SOLR    Comments:  Kevan Ny   : cabinet override      08/20/14 2033 08/20/14 2042   08/20/14 1515  cefTRIAXone (ROCEPHIN) 1 g in dextrose 5 % 50 mL IVPB     1 g 100 mL/hr over 30 Minutes Intravenous  Once 08/20/14 1504 08/20/14 1613      DVT Prophylaxis: Prophylactic  Heparin   Code Status: Full code   Family Communication Mother at bedside  Procedures: Perc Nephrostomy/Abscess drain 6/1  CONSULTS:  ID and urology  Time spent 25 minutes-Greater than 50% of this time was spent in counseling, explanation of diagnosis, planning  of further management, and coordination of care.  MEDICATIONS: Scheduled Meds: . ALPRAZolam  1 mg Oral BID  .  ceFAZolin (ANCEF) IV  2 g Intravenous 3 times per day  . Chlorhexidine Gluconate Cloth  6 each Topical Q0600  . folic acid  1 mg Oral Daily  . heparin  5,000 Units Subcutaneous 3 times per day  . mupirocin ointment  1 application Nasal BID  . pantoprazole  40 mg Oral Daily  . polyethylene glycol   17 g Oral Daily  . predniSONE  2.5 mg Oral BID WC  . senna  2 tablet Oral QHS  . sodium chloride  1,000 mL Intravenous Once  . sodium chloride  3 mL Intravenous Q12H  . thiamine  100 mg Oral Daily   Continuous Infusions: . sodium chloride 75 mL/hr at 08/23/14 1912   PRN Meds:.acetaminophen **OR** acetaminophen, morphine injection, ondansetron **OR** ondansetron (ZOFRAN) IV, oxyCODONE    PHYSICAL EXAM: Vital signs in last 24 hours: Filed Vitals:   08/23/14 0528 08/23/14 1436 08/23/14 2059 08/24/14 0548  BP: 114/63 129/78 121/67 116/65  Pulse: 72 91 78 72  Temp: 97.9 F (36.6 C) 98.3 F (36.8 C) 97.6 F (36.4 C) 97.9 F (36.6 C)  TempSrc: Oral Oral Oral Oral  Resp: 16 16 18 20   Height:      Weight:      SpO2: 93% 98% 99% 95%    Weight change:  Filed Weights   08/20/14 1030 08/20/14 1824  Weight: 70.308 kg (155 lb) 71.3 kg (157 lb 3 oz)   Body mass index is 28.27 kg/(m^2).   Gen Exam: Awake and alert with clear speech.  Neck: Supple, No JVD.  Chest: B/L Clear.  No rales/rhochi CVS: S1 S2 Regular Abdomen: soft, BS +, non tender, non distended.  Extremities: no edema, lower extremities warm to touch Neurologic: Non Focal-But with generalized weakness-pain also limits exam-but moves bilateral lower ext Skin: No Rash.   Wounds: N/A.    Intake/Output from previous day:  Intake/Output Summary (Last 24 hours) at 08/24/14 1220 Last data filed at 08/24/14 1023  Gross per 24 hour  Intake   3210 ml  Output   2906 ml  Net    304 ml     LAB RESULTS: CBC  Recent Labs Lab 08/20/14 1140 08/21/14 1225 08/22/14 0511 08/23/14 0419 08/24/14 0415  WBC 16.3* 5.6 7.8 5.0 5.5  HGB 12.8 11.7* 11.3* 10.1* 10.6*  HCT 38.0 35.5* 33.4* 31.1* 32.0*  PLT 491* 381 387 374 450*  MCV 103.3* 102.9* 100.3* 103.0* 102.9*  MCH 34.8* 33.9 33.9 33.4 34.1*  MCHC 33.7 33.0 33.8 32.5 33.1  RDW 13.1 13.1 13.0 13.3 13.4  LYMPHSABS 1.3  --   --   --   --   MONOABS 1.0  --   --   --   --    EOSABS 0.0  --   --   --   --   BASOSABS 0.0  --   --   --   --     Chemistries   Recent Labs Lab 08/20/14 1140 08/21/14 1225 08/22/14 0511 08/23/14 0419 08/24/14 0415  NA 136 138 137 140 143  K 3.8 3.1* 2.9* 3.6 3.6  CL 99* 104 101 107 106  CO2 26 23 25 26 27   GLUCOSE 108* 93 89 120* 95  BUN 19 11 9 10 7   CREATININE 0.85 0.65 0.59 0.56 0.47  CALCIUM 9.0 8.2* 8.3* 8.4* 8.6*  MG  --   --   --  1.9  --     CBG: No results for input(s): GLUCAP in the last 168 hours.  GFR Estimated Creatinine Clearance: 73.5 mL/min (by C-G formula based on Cr of 0.47).  Coagulation profile  Recent Labs Lab 08/20/14 2005  INR 1.08    Cardiac Enzymes  Recent Labs Lab 08/22/14 0952  TROPONINI <0.03    Invalid input(s): POCBNP No results for input(s): DDIMER in the last 72 hours. No results for input(s): HGBA1C in the last 72 hours. No results for input(s): CHOL, HDL, LDLCALC, TRIG, CHOLHDL, LDLDIRECT in the last 72 hours. No results for input(s): TSH, T4TOTAL, T3FREE, THYROIDAB in the last 72 hours.  Invalid input(s): FREET3 No results for input(s): VITAMINB12, FOLATE, FERRITIN, TIBC, IRON, RETICCTPCT in the last 72 hours. No results for input(s): LIPASE, AMYLASE in the last 72 hours.  Urine Studies No results for input(s): UHGB, CRYS in the last 72 hours.  Invalid input(s): UACOL, UAPR, USPG, UPH, UTP, UGL, UKET, UBIL, UNIT, UROB, ULEU, UEPI, UWBC, URBC, UBAC, CAST, UCOM, BILUA  MICROBIOLOGY: Recent Results (from the past 240 hour(s))  Urine culture     Status: None   Collection Time: 08/20/14  1:15 PM  Result Value Ref Range Status   Specimen Description URINE, CLEAN CATCH  Final   Special Requests NONE  Final   Colony Count   Final    >=100,000 COLONIES/ML Performed at [facility]    Culture   Final    STAPHYLOCOCCUS AUREUS Note: RIFAMPIN AND GENTAMICIN SHOULD NOT BE USED AS SINGLE DRUGS FOR TREATMENT OF STAPH INFECTIONS. Performed at [facility]    Report Status 08/22/2014 FINAL  Final   Organism ID, Bacteria STAPHYLOCOCCUS AUREUS  Final      Susceptibility   Staphylococcus aureus - MIC*    GENTAMICIN <=0.5 SENSITIVE Sensitive     LEVOFLOXACIN >=8 RESISTANT Resistant     NITROFURANTOIN <=16 SENSITIVE Sensitive     OXACILLIN 0.5 SENSITIVE Sensitive     PENICILLIN RESISTANT      RIFAMPIN <=0.5 SENSITIVE Sensitive     TRIMETH/SULFA <=10 SENSITIVE Sensitive     VANCOMYCIN 1 SENSITIVE Sensitive     TETRACYCLINE <=1 SENSITIVE Sensitive     * STAPHYLOCOCCUS AUREUS  Culture, blood (routine x 2)     Status: None   Collection Time: 08/20/14  3:24 PM  Result Value Ref Range Status   Specimen Description BLOOD RIGHT ANTECUBITAL  Final   Special Requests BOTTLES DRAWN AEROBIC AND ANAEROBIC 8CC  Final   Culture   Final    STAPHYLOCOCCUS AUREUS Note: RIFAMPIN AND GENTAMICIN SHOULD NOT BE USED AS SINGLE DRUGS FOR TREATMENT OF STAPH INFECTIONS. SUSCEPTIBILITIES PERFORMED ON PREVIOUS CULTURE WITHIN THE LAST 5 DAYS. Note: Gram Stain Report Called to,Read Back By and Verified With:  ARMSTRONG S @ 0715 08/21/2014 BY RESSEGGER RESISTANT Performed at [facility]    Report Status 08/23/2014 FINAL  Final  Culture, blood (routine x 2)     Status: None   Collection Time: 08/20/14  3:33 PM  Result Value Ref Range Status   Specimen Description BLOOD RIGHT HAND  Final   Special Requests BOTTLES DRAWN AEROBIC ONLY 4CC  Final   Culture   Final    STAPHYLOCOCCUS AUREUS Note: RIFAMPIN AND GENTAMICIN SHOULD NOT BE USED AS SINGLE DRUGS FOR TREATMENT OF STAPH INFECTIONS. Note: Gram Stain Report Called to,Read Back By and Verified With: ARMSTRONG S [facility]) AT 0715 ON 08/21/2014 BY RESSEGGER R Performed at [facility]    Report Status 08/23/2014 FINAL  Final   Organism ID, Bacteria STAPHYLOCOCCUS AUREUS  Final      Susceptibility   Staphylococcus aureus - MIC*    CLINDAMYCIN <=0.25 SENSITIVE Sensitive     ERYTHROMYCIN <=0.25  SENSITIVE Sensitive     GENTAMICIN <=0.5 SENSITIVE Sensitive     LEVOFLOXACIN >=8 RESISTANT Resistant     OXACILLIN 0.5 SENSITIVE Sensitive     PENICILLIN RESISTANT      RIFAMPIN <=0.5 SENSITIVE Sensitive     TRIMETH/SULFA <=10 SENSITIVE Sensitive     VANCOMYCIN <=0.5 SENSITIVE Sensitive     TETRACYCLINE <=1 SENSITIVE Sensitive     MOXIFLOXACIN 4 RESISTANT Resistant     * STAPHYLOCOCCUS AUREUS  MRSA PCR Screening     Status: Abnormal   Collection Time: 08/20/14  7:48 PM  Result Value Ref Range Status   MRSA by PCR POSITIVE (A) NEGATIVE Final    Comment:        The GeneXpert MRSA Assay (FDA approved for NASAL specimens only), is one component of a comprehensive MRSA colonization surveillance program. It is not intended to diagnose MRSA infection nor to guide or monitor treatment for MRSA infections. RESULT CALLED TO, READ BACK BY AND VERIFIED WITH: ARMSTRONG,S RN [phone] [place],N RESULT CALLED TO, READ BACK BY AND VERIFIED WITH: ARMSTRONG,S RN [phone] [id number] COVINGTON,N RESULT CALLED TO, READ BACK BY AND VERIFIED WITH: ARSTRONG,S RN [id number] COVINGTON,N   Culture, routine-abscess     Status: None   Collection Time: 08/20/14 10:16 PM  Result Value Ref Range Status   Specimen Description ABSCESS  Final   Special Requests NONE  Final   Gram Stain   Final    MODERATE WBC PRESENT,BOTH PMN AND MONONUCLEAR NO SQUAMOUS EPITHELIAL CELLS SEEN MODERATE GRAM POSITIVE COCCI IN PAIRS IN CLUSTERS Performed at [facility]    Culture   Final    ABUNDANT STAPHYLOCOCCUS AUREUS Note: RIFAMPIN AND GENTAMICIN SHOULD NOT BE USED AS SINGLE DRUGS FOR TREATMENT OF STAPH INFECTIONS. Performed at [facility]    Report Status 08/23/2014 FINAL  Final   Organism ID, Bacteria STAPHYLOCOCCUS AUREUS  Final      Susceptibility   Staphylococcus aureus - MIC*    CLINDAMYCIN <=0.25 SENSITIVE Sensitive     ERYTHROMYCIN <=0.25 SENSITIVE Sensitive     GENTAMICIN <=0.5 SENSITIVE  Sensitive     LEVOFLOXACIN >=8 RESISTANT Resistant     OXACILLIN 0.5 SENSITIVE Sensitive     PENICILLIN 0.12 SENSITIVE Sensitive     RIFAMPIN <=0.5 SENSITIVE Sensitive     TRIMETH/SULFA <=10 SENSITIVE Sensitive     VANCOMYCIN 1 SENSITIVE Sensitive     TETRACYCLINE <=1 SENSITIVE Sensitive     MOXIFLOXACIN 4 RESISTANT Resistant     * ABUNDANT STAPHYLOCOCCUS AUREUS  Anaerobic culture     Status: None (Preliminary result)   Collection Time: 08/20/14 10:16 PM  Result Value Ref Range Status   Specimen Description ABSCESS  Final   Special Requests NONE  Final   Gram Stain   Final    MODERATE WBC PRESENT,BOTH PMN AND MONONUCLEAR NO SQUAMOUS EPITHELIAL CELLS SEEN MODERATE GRAM POSITIVE COCCI IN PAIRS IN CLUSTERS Performed at [facility]    Culture   Final    NO ANAEROBES ISOLATED; CULTURE IN PROGRESS FOR 5 DAYS Performed at [facility]    Report Status PENDING  Incomplete  Blood Cultures x 2 sites     Status: None (Preliminary result)   Collection  Time: 08/21/14  4:50 PM  Result Value Ref Range Status   Specimen Description BLOOD RIGHT HAND  Final   Special Requests BOTTLES DRAWN AEROBIC ONLY 10CC  Final   Culture   Final           BLOOD CULTURE RECEIVED NO GROWTH TO DATE CULTURE WILL BE HELD FOR 5 DAYS BEFORE ISSUING A FINAL NEGATIVE REPORT Performed at [facility]    Report Status PENDING  Incomplete  Blood Cultures x 2 sites     Status: None (Preliminary result)   Collection Time: 08/21/14  5:05 PM  Result Value Ref Range Status   Specimen Description BLOOD RIGHT ARM  Final   Special Requests BOTTLES DRAWN AEROBIC AND ANAEROBIC 10CC  Final   Culture   Final           BLOOD CULTURE RECEIVED NO GROWTH TO DATE CULTURE WILL BE HELD FOR 5 DAYS BEFORE ISSUING A FINAL NEGATIVE REPORT Performed at [facility]    Report Status PENDING  Incomplete    RADIOLOGY STUDIES/RESULTS: Dg Cervical Spine Complete  08/22/2014   CLINICAL DATA:  Rheumatoid  arthritis, potential planning for transesophageal echocardiography, history lupus  EXAM: CERVICAL SPINE  4+ VIEWS  COMPARISON:  None  FINDINGS: Straightening of cervical lordosis question muscle spasm.  Prevertebral soft tissues normal to upper normal in thickness.  Vertebral body and disc space heights maintained.  No acute fracture, subluxation or bone destruction.  Predental space normal.  Osseous neural foramina grossly patent.  Lung apices clear.  Facet alignments normal.  C1-C2 alignment normal.  IMPRESSION: No acute abnormalities.   Electronically Signed   By: Ulyses Southward M.D.   On: 08/22/2014 14:44   Ct Angio Chest Pe W/cm &/or Wo Cm  08/23/2014   CLINICAL DATA:  Chest pain question pulmonary embolism, chest pain began this morning on RIGHT side near breast, shortness of breath, cough, history lupus, rheumatoid arthritis  EXAM: CT ANGIOGRAPHY CHEST WITH CONTRAST  TECHNIQUE: Multidetector CT imaging of the chest was performed using the standard protocol during bolus administration of intravenous contrast. Multiplanar CT image reconstructions and MIPs were obtained to evaluate the vascular anatomy.  CONTRAST:  OMNIPAQUE IOHEXOL 350 MG/ML SOLN  COMPARISON:  None  FINDINGS: Aorta normal caliber without aneurysm or dissection.  Pulmonary arteries adequately opacified and patent.  No evidence of pulmonary embolism.  Visualized portion of liver and spleen normal appearance.  No thoracic adenopathy, though normal size lymph nodes are seen subcarinal and in both axilla.  Tiny BILATERAL pleural effusions with atelectasis in both lower lobes greater on LEFT.  6 mm diameter nodular density RIGHT upper lobe image 28.  6 mm nodular density LEFT lower lobe image 34.  Remaining lungs clear.  No pneumothorax or definite osseous findings.  Review of the MIP images confirms the above findings.  IMPRESSION: Small BILATERAL pleural effusions with subsegmental atelectasis in BILATERAL lower lobes.  No evidence pulmonary  embolism.  BILATERAL 6 mm pulmonary nodules, recommendation below.  If the patient is at high risk for bronchogenic carcinoma, follow-up chest CT at 6-12 months is recommended. If the patient is at low risk for bronchogenic carcinoma, follow-up chest CT at 12 months is recommended. This recommendation follows the consensus statement: Guidelines for Management of Small Pulmonary Nodules Detected on CT Scans: A Statement from the Fleischner Society as published in Radiology 2005;237:395-400.   Electronically Signed   By: Ulyses Southward M.D.   On: 08/23/2014 16:01   Mr  Lumbar Spine W Wo Contrast  08/22/2014   CLINICAL DATA:  Retroperitoneal abscess. 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  EXAM: MRI LUMBAR SPINE WITHOUT AND WITH CONTRAST  TECHNIQUE: Multiplanar and multiecho pulse sequences of the lumbar spine were obtained without and with intravenous contrast.  CONTRAST:  70mL MULTIHANCE GADOBENATE DIMEGLUMINE 529 MG/ML IV SOLN  COMPARISON:  CT 08/20/2014.  FINDINGS: Segmentation: The numbering convention used for this exam termed L5-S1 as the last intervertebral disc space. Numbering was correlated with prior CT.  Alignment: Grade I anterolisthesis of L5 on S1 is a chronic finding and probably associated with old pars defects. Decompression with solid posterior lateral fusion.  Vertebrae: Benign hemangioma present at T12. Degenerative endplate changes are present at L1-L2. L4 inferior endplate edema appears discogenic and shows mild post gadolinium enhancement. This is unlikely to represent discogenic infection given the degenerated disc and vacuum disc seen on prior CT 08/20/2014.  Conus medullaris: Normal termination at L1.  Paraspinal tissues: RIGHT nephrostomy and RIGHT sella is drains noted. No definite interval change compared to prior abdominal CT.  Disc levels:  Disc Signal: Disc degeneration at L4-L5. Partial ossification of the L5-S1 disc associated with posterior lateral fusion and posterior  decompression.  L1-L2:  Negative.  L2-L3:  Facet hypertrophy without stenosis.  Normal disc.  L3-L4:  Mild facet degeneration.  No stenosis.  L4-L5: There is distraction of the LEFT facet joint with some fluid in the joint and enhancing granulation tissue or synovitis in the posterior aspect of the joint. The joint is degenerated and given the distraction, this is most compatible with ordinary degenerative changes and facet osteoarthritis. Infection of the facet joint is considered unlikely. There is little if any periarticular edema around the LEFT L4-L5 facet joint. The RIGHT facet joint appears normal. An isolated septic facet arthritis even in the setting of psoas abscess is unlikely although there is imaging overlap between the appearance is of infection and degenerative disease.  Disc degeneration. Shallow broad-based posterior disc protrusion superimposed on disc bulging. Bilateral L4 laminotomies have been performed and central canal is decompressed. There is bilateral subarticular stenosis. LEFT foraminal stenosis is moderate, associated with degenerative disc and facet disease.  L5-S1: Partial ossification of the disc. Solid posterior lateral fusion and laminectomy with good decompression of the central canal.  IMPRESSION: 1. No convincing findings of spinal infection in this patient with psoas abscess and drainage. Mild inflammatory changes are present at the LEFT L4-L5 facet joint that are more compatible with ordinary degenerative disease than septic arthritis. Although there is imaging overlap between the appearances, septic arthritis is unlikely. 2. Solid L5-S1 fusion and decompression. 3. 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   Electronically Signed   By: Andreas Newport M.D.   On: 08/22/2014 08:25   Ct Abdomen Pelvis W Contrast  08/20/2014   CLINICAL DATA:  Right upper quadrant abdominal pain for 1 week with  nausea and vomiting. History of appendectomy, hernia repair and systemic lupus erythematosus. Moderate leukocytosis. Initial encounter.  EXAM: CT ABDOMEN AND PELVIS WITH CONTRAST  TECHNIQUE: Multidetector CT imaging of the abdomen and pelvis was performed using the standard protocol following bolus administration of intravenous contrast.  CONTRAST:  100 ml Omnipaque 300  COMPARISON:  Ultrasound same date.  FINDINGS: Minimal dependent atelectasis at both lung bases. Otherwise clear. No pleural or pericardial effusion.  Lower chest: Mildly decreased hepatic density suspicious for steatosis. No focal lesions identified.  Hepatobiliary:  The liver is normal in density without focal abnormality. No evidence of gallstones, gallbladder wall thickening or biliary dilatation.  Pancreas: Unremarkable. No pancreatic ductal dilatation or surrounding inflammatory changes.  Spleen: Normal in size without focal abnormality.  Adrenals/Urinary Tract: Both adrenal glands appear normal.The left kidney appears normal. The right kidney demonstrates hydronephrosis and delayed contrast excretion. There is asymmetric perinephric soft tissue stranding on the right. There is a complex fluid collection or centrally necrotic mass adjacent to the ureteropelvic junction, measuring 5.6 x 3.2 cm transverse on image 46. This extends into the right psoas muscle. No urinary tract calculi demonstrated. The distal ureters and bladder appear unremarkable.  Stomach/Bowel: No evidence of bowel wall thickening, distention or surrounding inflammatory change.Mild sigmoid colon diverticulosis without surrounding inflammation.  Vascular/Lymphatic: There are several mildly prominent retroperitoneal lymph nodes, likely reactive. No mesenteric adenopathy. Mild aortoiliac atherosclerosis. The IVC and renal veins appear patent.  Reproductive: The uterus appears unremarkable.  No adnexal mass.  Other: No evidence of abdominal wall mass or hernia.  Musculoskeletal: No  acute or significant osseous findings. Mild lumbar spondylosis noted. There are postsurgical changes at the lumbosacral junction with a grade 1 anterolisthesis. Moderate facet hypertrophy noted at L4-5. The paraspinal fat planes are maintained. No evidence of discitis or osteomyelitis P  IMPRESSION: 1. Suspected abscess within the right retroperitoneum involving the psoas muscle and obstructing the right ureteral pelvic junction. This is an atypical location, and necrotic neoplasm cannot be completely excluded. No other cause for ureteral obstruction identified. 2. Small retroperitoneal lymph nodes are probably reactive. No other suspicious masses identified. 3. No evidence of urinary tract calculus. 4. Urology consultation recommended. Results were discussed by telephone with Dr. Fayrene Fearing.   Electronically Signed   By: Carey Bullocks M.D.   On: 08/20/2014 14:38   Ir Nephrostomy Placement Right  08/21/2014   CLINICAL DATA:  54 year old female with sepsis secondary to right psoas/ perinephric abscess. This abscess collection results in at least partial obstruction of the proximal ureter and right hydronephrosis. Additionally, there is clinical concern for possible pyonephrosis of the obstructed kidney. Therefore, percutaneous nephrostomy tube placement is warranted. This will then be followed by percutaneous drainage of the psoas/perinephric abscess.  EXAM: IR NEPHROSTOMY PLACEMENT RIGHT  Date: 08/21/2014  PROCEDURE: 1. Ultrasound-guided puncture right renal collecting system 2. Placement of a 10 French percutaneous nephrostomy tube using fluoroscopic guidance Interventional Radiologist:  Sterling Big, MD  ANESTHESIA/SEDATION: Moderate (conscious) sedation was used. 2 mg Versed, 100 mcg Fentanyl were administered intravenously. The patient's vital signs were monitored continuously by radiology nursing throughout the procedure.  Sedation Time: 25 minutes  MEDICATIONS: 2 g Ancef administered intravenously   FLUOROSCOPY TIME:  4 minutes 30 seconds  81 mGy  CONTRAST:  73mL OMNIPAQUE IOHEXOL 300 MG/ML  SOLN  TECHNIQUE: Informed consent was obtained from the patient following explanation of the procedure, risks, benefits and alternatives. The patient understands, agrees and consents for the procedure. All questions were addressed. A time out was performed.  Maximal barrier sterile technique utilized including caps, mask, sterile gowns, sterile gloves, large sterile drape, hand hygiene, and Betadine skin prep.  The right flank was interrogated with ultrasound. A suitable posterior inferior calyx was identified. Local anesthesia was attained by infiltration with 1% lidocaine. A small dermatotomy was made. Using real-time ultrasound guidance, the posterior inferior calyx was punctured with a 21 gauge Accustick needle. Free return of turbid urine from the needle once the stylet was removed confirmed its location within the collecting  system. A gentle hand injection of contrast material opacified the selected lower pole calyx and infundibulum.  A night tracks wire was then advanced into the renal pelvis. The Accustick needle was exchanged for the Accustick sheath. A Bentson wire was then coiled in the renal pelvis and the Accustick sheath removed. The skin tract was dilated to 10 [place] and a [place] all-purpose drainage catheter was then advanced over the wire and formed with the locking loop in the renal pelvis. A final image was obtained after confirming location within the renal pelvis by gentle hand injection of contrast.  The nephrostomy tube was then secured to the skin with 0 Prolene suture and a sterile bandage. The catheter was connected to gravity bag drainage. The patient tolerated the procedure well.  COMPLICATIONS: None  IMPRESSION: Technically successful placement of a 10 French right-sided percutaneous nephrostomy tube.  Signed,  Sterling Big, MD  [facility]   [facility]   Electronically Signed   By: Malachy Moan M.D.   On: 08/21/2014 16:52   Ct Image Guided Drainage By Percutaneous Catheter  08/21/2014   CLINICAL DATA:  54 year old female with a right retroperitoneal abscess and associated hydronephrosis. CT-guided drain placement is warranted in the setting of sepsis.  EXAM: CT IMAGE GUIDED DRAINAGE BY PERCUTANEOUS CATHETER  Date: 08/21/2014  PROCEDURE: 1. CT-guided drain placement in the right psoas muscle/perinephric space Interventional Radiologist:  Sterling Big, MD  ANESTHESIA/SEDATION: Moderate (conscious) sedation was used. 0.5 mg Versed, 25 mcg Fentanyl were administered intravenously. The patient's vital signs were monitored continuously by radiology nursing throughout the procedure.  Sedation Time: 15 minutes  MEDICATIONS: 2 g Ancef given within 1 hour of skin incision  TECHNIQUE: Informed consent was obtained from the patient following explanation of the procedure, risks, benefits and alternatives. The patient understands, agrees and consents for the procedure. All questions were addressed. A time out was performed.  A planning axial CT scan was performed. The fluid collection within the right psoas musculature was successfully identified. A suitable skin entry site was selected and marked. The region was then sterilely prepped and draped in standard fashion using Betadine skin prep. Local anesthesia was attained by infiltration with 1% lidocaine. A small dermatotomy was made. Under intermittent CT fluoroscopic guidance, an 18 gauge introducer needle was advanced into the fluid collection. An Amplatz wire was then coiled within the fluid collection in the skin tract dilated to 12 [place]. A Cook 12 [place] all-purpose drainage catheter was then advanced over the wire and formed within the fluid collection. Aspiration yields 20 mL of thick, frankly purulent material. A sample was sent for Gram stain and culture.  Axial CT imaging was then  performed confirming good positioning of the drainage catheter. The fluid collection has notably decreased in size. The drainage tube was then secured to the skin with 0 Prolene suture and an adhesive fixation device. The tube was connected to JP bulb suction.  COMPLICATIONS: None  IMPRESSION: Successful placement of a 12 French percutaneous drainage catheter in the right psoas/perinephric fluid collection.  Aspiration yields 20 mL thick, frankly purulent material. Sample was sent for culture.  PLAN: Once drainage has been minimal (less than 20 mL) for several days, recommend repeat CT scan with contrast to ensure resolution of the abscess prior to tube removal.  Signed,  Sterling Big, MD  [facility]  [facility]   Electronically Signed   By: Isac Caddy.D.  On: 08/21/2014 18:00   US Abdomen Limited Ruq  08/20/2014   CLINICAL DATA:  Sharp RIGHT upper quadrant pain for 1 week, history lupus, rheumatoid arthritis  EXAM: US ABDOMEN LIMITED - RIGHT UPPER QUADRANT  COMPARISON:  None  FINDINGS: Gallbladder:  Minimal dependent sludge in gallbladder. No shadowing gallstones, gallbladder wall thickening or pericholecystic fluid.  Common bile duct:  Diameter: Normal caliber 3 mm diameter  Liver:  Echogenic, likely fatty infiltration, though this can be seen with cirrhosis and certain infiltrative disorders. No focal hepatic mass or nodularity. Hepatopetal portal venous flow.  No RIGHT upper quadrant free fluid.  IMPRESSION: Probable fatty infiltration of liver as above.  Minimal gallbladder sludge.  Otherwise negative exam.   Electronically Signed   By: Ulyses Southward M.D.   On: 08/20/2014 13:04    Jeoffrey Massed, MD  [facility] Pager:[phone]  If 7PM-7AM, please contact night-coverage [email or web address] Password [email or web address] 08/24/2014, 12:20 PM   LOS: 4 days

## 2014-08-24 NOTE — Progress Notes (Signed)
Subjective: Urology weekend coverage:  MRSA abscess and by PCR.  Rx per [facility] and ID.  Perc tubes in place  Objective: Vital signs in last 24 hours: Temp:  [97.6 F (36.4 C)-98.3 F (36.8 C)] 97.9 F (36.6 C) (06/05 0548) Pulse Rate:  [72-91] 72 (06/05 0548) Resp:  [16-20] 20 (06/05 0548) BP: (116-129)/(65-78) 116/65 mmHg (06/05 0548) SpO2:  [95 %-99 %] 95 % (06/05 0548)A  Intake/Output from previous day: 06/04 0701 - 06/05 0700 In: 3405 [P.O.:1180; I.V.:1540; IV Piggyback:100] Out: 2504 [Urine:2500; Drains:4] Intake/Output this shift: Total I/O In: 50 [IV Piggyback:50] Out: 706 [Urine:706]  Past Medical History  Diagnosis Date  . Lupus   . Rheumatoid arthritis     Physical Exam:  Lungs - Normal respiratory effort, chest expands symmetrically.  Abdomen - Soft, non-tender & non-distended.  Lab Results:  Recent Labs  08/22/14 0511 08/23/14 0419 08/24/14 0415  WBC 7.8 5.0 5.5  HGB 11.3* 10.1* 10.6*  HCT 33.4* 31.1* 32.0*   BMET  Recent Labs  08/23/14 0419 08/24/14 0415  NA 140 143  K 3.6 3.6  CL 107 106  CO2 26 27  GLUCOSE 120* 95  BUN 10 7  CREATININE 0.56 0.47  CALCIUM 8.4* 8.6*   No results for input(s): LABURIN in the last 72 hours. Results for orders placed or performed during the hospital encounter of 08/20/14  Urine culture     Status: None   Collection Time: 08/20/14  1:15 PM  Result Value Ref Range Status   Specimen Description URINE, CLEAN CATCH  Final   Special Requests NONE  Final   Colony Count   Final    >=100,000 COLONIES/ML Performed at [facility]    Culture   Final    STAPHYLOCOCCUS AUREUS Note: RIFAMPIN AND GENTAMICIN SHOULD NOT BE USED AS SINGLE DRUGS FOR TREATMENT OF STAPH INFECTIONS. Performed at [facility]    Report Status 08/22/2014 FINAL  Final   Organism ID, Bacteria STAPHYLOCOCCUS AUREUS  Final      Susceptibility   Staphylococcus aureus - MIC*    GENTAMICIN <=0.5 SENSITIVE Sensitive      LEVOFLOXACIN >=8 RESISTANT Resistant     NITROFURANTOIN <=16 SENSITIVE Sensitive     OXACILLIN 0.5 SENSITIVE Sensitive     PENICILLIN RESISTANT      RIFAMPIN <=0.5 SENSITIVE Sensitive     TRIMETH/SULFA <=10 SENSITIVE Sensitive     VANCOMYCIN 1 SENSITIVE Sensitive     TETRACYCLINE <=1 SENSITIVE Sensitive     * STAPHYLOCOCCUS AUREUS  Culture, blood (routine x 2)     Status: None   Collection Time: 08/20/14  3:24 PM  Result Value Ref Range Status   Specimen Description BLOOD RIGHT ANTECUBITAL  Final   Special Requests BOTTLES DRAWN AEROBIC AND ANAEROBIC 8CC  Final   Culture   Final    STAPHYLOCOCCUS AUREUS Note: RIFAMPIN AND GENTAMICIN SHOULD NOT BE USED AS SINGLE DRUGS FOR TREATMENT OF STAPH INFECTIONS. SUSCEPTIBILITIES PERFORMED ON PREVIOUS CULTURE WITHIN THE LAST 5 DAYS. Note: Gram Stain Report Called to,Read Back By and Verified With:  ARMSTRONG S @ 0715 08/21/2014 BY RESSEGGER RESISTANT Performed at [facility]    Report Status 08/23/2014 FINAL  Final  Culture, blood (routine x 2)     Status: None   Collection Time: 08/20/14  3:33 PM  Result Value Ref Range Status   Specimen Description BLOOD RIGHT HAND  Final   Special Requests BOTTLES DRAWN AEROBIC ONLY 4CC  Final   Culture  Final    STAPHYLOCOCCUS AUREUS Note: RIFAMPIN AND GENTAMICIN SHOULD NOT BE USED AS SINGLE DRUGS FOR TREATMENT OF STAPH INFECTIONS. Note: Gram Stain Report Called to,Read Back By and Verified With: ARMSTRONG S [facility]) AT 0715 ON 08/21/2014 BY RESSEGGER R Performed at [facility]    Report Status 08/23/2014 FINAL  Final   Organism ID, Bacteria STAPHYLOCOCCUS AUREUS  Final      Susceptibility   Staphylococcus aureus - MIC*    CLINDAMYCIN <=0.25 SENSITIVE Sensitive     ERYTHROMYCIN <=0.25 SENSITIVE Sensitive     GENTAMICIN <=0.5 SENSITIVE Sensitive     LEVOFLOXACIN >=8 RESISTANT Resistant     OXACILLIN 0.5 SENSITIVE Sensitive     PENICILLIN RESISTANT      RIFAMPIN <=0.5 SENSITIVE  Sensitive     TRIMETH/SULFA <=10 SENSITIVE Sensitive     VANCOMYCIN <=0.5 SENSITIVE Sensitive     TETRACYCLINE <=1 SENSITIVE Sensitive     MOXIFLOXACIN 4 RESISTANT Resistant     * STAPHYLOCOCCUS AUREUS  MRSA PCR Screening     Status: Abnormal   Collection Time: 08/20/14  7:48 PM  Result Value Ref Range Status   MRSA by PCR POSITIVE (A) NEGATIVE Final    Comment:        The GeneXpert MRSA Assay (FDA approved for NASAL specimens only), is one component of a comprehensive MRSA colonization surveillance program. It is not intended to diagnose MRSA infection nor to guide or monitor treatment for MRSA infections. RESULT CALLED TO, READ BACK BY AND VERIFIED WITH: ARMSTRONG,S RN [phone] [id number] [place],N RESULT CALLED TO, READ BACK BY AND VERIFIED WITH: ARMSTRONG,S RN [phone] [id number] [place],N RESULT CALLED TO, READ BACK BY AND VERIFIED WITH: ARSTRONG,S RN [id number] [place],N   Culture, routine-abscess     Status: None   Collection Time: 08/20/14 10:16 PM  Result Value Ref Range Status   Specimen Description ABSCESS  Final   Special Requests NONE  Final   Gram Stain   Final    MODERATE WBC PRESENT,BOTH PMN AND MONONUCLEAR NO SQUAMOUS EPITHELIAL CELLS SEEN MODERATE GRAM POSITIVE COCCI IN PAIRS IN CLUSTERS Performed at [facility]    Culture   Final    ABUNDANT STAPHYLOCOCCUS AUREUS Note: RIFAMPIN AND GENTAMICIN SHOULD NOT BE USED AS SINGLE DRUGS FOR TREATMENT OF STAPH INFECTIONS. Performed at [facility]    Report Status 08/23/2014 FINAL  Final   Organism ID, Bacteria STAPHYLOCOCCUS AUREUS  Final      Susceptibility   Staphylococcus aureus - MIC*    CLINDAMYCIN <=0.25 SENSITIVE Sensitive     ERYTHROMYCIN <=0.25 SENSITIVE Sensitive     GENTAMICIN <=0.5 SENSITIVE Sensitive     LEVOFLOXACIN >=8 RESISTANT Resistant     OXACILLIN 0.5 SENSITIVE Sensitive     PENICILLIN 0.12 SENSITIVE Sensitive     RIFAMPIN <=0.5 SENSITIVE Sensitive     TRIMETH/SULFA <=10  SENSITIVE Sensitive     VANCOMYCIN 1 SENSITIVE Sensitive     TETRACYCLINE <=1 SENSITIVE Sensitive     MOXIFLOXACIN 4 RESISTANT Resistant     * ABUNDANT STAPHYLOCOCCUS AUREUS  Anaerobic culture     Status: None (Preliminary result)   Collection Time: 08/20/14 10:16 PM  Result Value Ref Range Status   Specimen Description ABSCESS  Final   Special Requests NONE  Final   Gram Stain   Final    MODERATE WBC PRESENT,BOTH PMN AND MONONUCLEAR NO SQUAMOUS EPITHELIAL CELLS SEEN MODERATE GRAM POSITIVE COCCI IN PAIRS IN CLUSTERS Performed at [facility]  Culture   Final    NO ANAEROBES ISOLATED; CULTURE IN PROGRESS FOR 5 DAYS Performed at [facility]    Report Status PENDING  Incomplete  Blood Cultures x 2 sites     Status: None (Preliminary result)   Collection Time: 08/21/14  4:50 PM  Result Value Ref Range Status   Specimen Description BLOOD RIGHT HAND  Final   Special Requests BOTTLES DRAWN AEROBIC ONLY 10CC  Final   Culture   Final           BLOOD CULTURE RECEIVED NO GROWTH TO DATE CULTURE WILL BE HELD FOR 5 DAYS BEFORE ISSUING A FINAL NEGATIVE REPORT Performed at [facility]    Report Status PENDING  Incomplete  Blood Cultures x 2 sites     Status: None (Preliminary result)   Collection Time: 08/21/14  5:05 PM  Result Value Ref Range Status   Specimen Description BLOOD RIGHT ARM  Final   Special Requests BOTTLES DRAWN AEROBIC AND ANAEROBIC 10CC  Final   Culture   Final           BLOOD CULTURE RECEIVED NO GROWTH TO DATE CULTURE WILL BE HELD FOR 5 DAYS BEFORE ISSUING A FINAL NEGATIVE REPORT Performed at [facility]    Report Status PENDING  Incomplete    Studies/Results: Ct Angio Chest Pe W/cm &/or Wo Cm  08/23/2014   CLINICAL DATA:  Chest pain question pulmonary embolism, chest pain began this morning on RIGHT side near breast, shortness of breath, cough, history lupus, rheumatoid arthritis  EXAM: CT ANGIOGRAPHY CHEST WITH CONTRAST   TECHNIQUE: Multidetector CT imaging of the chest was performed using the standard protocol during bolus administration of intravenous contrast. Multiplanar CT image reconstructions and MIPs were obtained to evaluate the vascular anatomy.  CONTRAST:  OMNIPAQUE IOHEXOL 350 MG/ML SOLN  COMPARISON:  None  FINDINGS: Aorta normal caliber without aneurysm or dissection.  Pulmonary arteries adequately opacified and patent.  No evidence of pulmonary embolism.  Visualized portion of liver and spleen normal appearance.  No thoracic adenopathy, though normal size lymph nodes are seen subcarinal and in both axilla.  Tiny BILATERAL pleural effusions with atelectasis in both lower lobes greater on LEFT.  6 mm diameter nodular density RIGHT upper lobe image 28.  6 mm nodular density LEFT lower lobe image 34.  Remaining lungs clear.  No pneumothorax or definite osseous findings.  Review of the MIP images confirms the above findings.  IMPRESSION: Small BILATERAL pleural effusions with subsegmental atelectasis in BILATERAL lower lobes.  No evidence pulmonary embolism.  BILATERAL 6 mm pulmonary nodules, recommendation below.  If the patient is at high risk for bronchogenic carcinoma, follow-up chest CT at 6-12 months is recommended. If the patient is at low risk for bronchogenic carcinoma, follow-up chest CT at 12 months is recommended. This recommendation follows the consensus statement: Guidelines for Management of Small Pulmonary Nodules Detected on CT Scans: A Statement from the Fleischner Society as published in Radiology 2005;237:395-400.   Electronically Signed   By: Ulyses Southward M.D.   On: 08/23/2014 16:01    Assessment: MRSA on RX.  Plan: Follow.  Deanna Ball 08/24/2014, 11:18 AM

## 2014-08-24 NOTE — Progress Notes (Signed)
Patient ID: Deanna Ball, female   DOB: 1960-07-03, 54 y.o.   MRN: [id number]    Referring Physician(s): MacDiarmid  Subjective: Pt still c/o rt lateral abd/flank/lat chest discomfort with occ radiation down rt leg   Allergies: Review of patient's allergies indicates no known allergies.  Medications: Prior to Admission medications   Medication Sig Start Date End Date Taking? Authorizing Provider  ALPRAZolam Prudy Feeler) 1 MG tablet Take 1 mg by mouth 2 (two) times daily.  07/31/14  Yes Historical Provider, MD  HYDROcodone-acetaminophen (NORCO/VICODIN) 5-325 MG per tablet  07/31/14  Yes Historical Provider, MD  methotrexate (RHEUMATREX) 2.5 MG tablet Take 25 mg by mouth 2 (two) times a week. Caution:Chemotherapy. Protect from light.   Yes Historical Provider, MD  naproxen sodium (ANAPROX) 220 MG tablet Take 220 mg by mouth 2 (two) times daily with a meal.   Yes Historical Provider, MD  NEXIUM 40 MG capsule  07/17/14  Yes Historical Provider, MD  predniSONE (DELTASONE) 2.5 MG tablet Take 2.5 mg by mouth 2 (two) times daily with a meal.   Yes Historical Provider, MD  Vitamin D, Ergocalciferol, (DRISDOL) 50000 UNITS CAPS capsule Take 50,000 Units by mouth every 7 (seven) days.   Yes Historical Provider, MD     Vital Signs: BP 116/65 mmHg  Pulse 72  Temp(Src) 97.9 F (36.6 C) (Oral)  Resp 20  Ht 5' 2.5" (1.588 m)  Wt 157 lb 3 oz (71.3 kg)  BMI 28.27 kg/m2  SpO2 95%  Physical Exam rt PCN intact, output 175 cc's yellow urine, insertion site ok; rt posas drain intact, insertion site mod tender, output minimal; drain irrigated with 5 cc's sterile NS with return of same amt turbid, blood tinged fluid with radiation of pain to RLE afterwards  Imaging: Dg Cervical Spine Complete  08/22/2014   CLINICAL DATA:  Rheumatoid arthritis, potential planning for transesophageal echocardiography, history lupus  EXAM: CERVICAL SPINE  4+ VIEWS  COMPARISON:  None  FINDINGS: Straightening of cervical lordosis  question muscle spasm.  Prevertebral soft tissues normal to upper normal in thickness.  Vertebral body and disc space heights maintained.  No acute fracture, subluxation or bone destruction.  Predental space normal.  Osseous neural foramina grossly patent.  Lung apices clear.  Facet alignments normal.  C1-C2 alignment normal.  IMPRESSION: No acute abnormalities.   Electronically Signed   By: Ulyses Southward M.D.   On: 08/22/2014 14:44   Ct Angio Chest Pe W/cm &/or Wo Cm  08/23/2014   CLINICAL DATA:  Chest pain question pulmonary embolism, chest pain began this morning on RIGHT side near breast, shortness of breath, cough, history lupus, rheumatoid arthritis  EXAM: CT ANGIOGRAPHY CHEST WITH CONTRAST  TECHNIQUE: Multidetector CT imaging of the chest was performed using the standard protocol during bolus administration of intravenous contrast. Multiplanar CT image reconstructions and MIPs were obtained to evaluate the vascular anatomy.  CONTRAST:  OMNIPAQUE IOHEXOL 350 MG/ML SOLN  COMPARISON:  None  FINDINGS: Aorta normal caliber without aneurysm or dissection.  Pulmonary arteries adequately opacified and patent.  No evidence of pulmonary embolism.  Visualized portion of liver and spleen normal appearance.  No thoracic adenopathy, though normal size lymph nodes are seen subcarinal and in both axilla.  Tiny BILATERAL pleural effusions with atelectasis in both lower lobes greater on LEFT.  6 mm diameter nodular density RIGHT upper lobe image 28.  6 mm nodular density LEFT lower lobe image 34.  Remaining lungs clear.  No pneumothorax or definite osseous  findings.  Review of the MIP images confirms the above findings.  IMPRESSION: Small BILATERAL pleural effusions with subsegmental atelectasis in BILATERAL lower lobes.  No evidence pulmonary embolism.  BILATERAL 6 mm pulmonary nodules, recommendation below.  If the patient is at high risk for bronchogenic carcinoma, follow-up chest CT at 6-12 months is recommended. If  the patient is at low risk for bronchogenic carcinoma, follow-up chest CT at 12 months is recommended. This recommendation follows the consensus statement: Guidelines for Management of Small Pulmonary Nodules Detected on CT Scans: A Statement from the Fleischner Society as published in Radiology 2005;237:395-400.   Electronically Signed   By: Ulyses Southward M.D.   On: 08/23/2014 16:01   Mr Lumbar Spine W Wo Contrast  08/22/2014   CLINICAL DATA:  Retroperitoneal abscess. 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  EXAM: MRI LUMBAR SPINE WITHOUT AND WITH CONTRAST  TECHNIQUE: Multiplanar and multiecho pulse sequences of the lumbar spine were obtained without and with intravenous contrast.  CONTRAST:  50mL MULTIHANCE GADOBENATE DIMEGLUMINE 529 MG/ML IV SOLN  COMPARISON:  CT 08/20/2014.  FINDINGS: Segmentation: The numbering convention used for this exam termed L5-S1 as the last intervertebral disc space. Numbering was correlated with prior CT.  Alignment: Grade I anterolisthesis of L5 on S1 is a chronic finding and probably associated with old pars defects. Decompression with solid posterior lateral fusion.  Vertebrae: Benign hemangioma present at T12. Degenerative endplate changes are present at L1-L2. L4 inferior endplate edema appears discogenic and shows mild post gadolinium enhancement. This is unlikely to represent discogenic infection given the degenerated disc and vacuum disc seen on prior CT 08/20/2014.  Conus medullaris: Normal termination at L1.  Paraspinal tissues: RIGHT nephrostomy and RIGHT sella is drains noted. No definite interval change compared to prior abdominal CT.  Disc levels:  Disc Signal: Disc degeneration at L4-L5. Partial ossification of the L5-S1 disc associated with posterior lateral fusion and posterior decompression.  L1-L2:  Negative.  L2-L3:  Facet hypertrophy without stenosis.  Normal disc.  L3-L4:  Mild facet degeneration.  No stenosis.  L4-L5: There is distraction of the LEFT  facet joint with some fluid in the joint and enhancing granulation tissue or synovitis in the posterior aspect of the joint. The joint is degenerated and given the distraction, this is most compatible with ordinary degenerative changes and facet osteoarthritis. Infection of the facet joint is considered unlikely. There is little if any periarticular edema around the LEFT L4-L5 facet joint. The RIGHT facet joint appears normal. An isolated septic facet arthritis even in the setting of psoas abscess is unlikely although there is imaging overlap between the appearance is of infection and degenerative disease.  Disc degeneration. Shallow broad-based posterior disc protrusion superimposed on disc bulging. Bilateral L4 laminotomies have been performed and central canal is decompressed. There is bilateral subarticular stenosis. LEFT foraminal stenosis is moderate, associated with degenerative disc and facet disease.  L5-S1: Partial ossification of the disc. Solid posterior lateral fusion and laminectomy with good decompression of the central canal.  IMPRESSION: 1. No convincing findings of spinal infection in this patient with psoas abscess and drainage. Mild inflammatory changes are present at the LEFT L4-L5 facet joint that are more compatible with ordinary degenerative disease than septic arthritis. Although there is imaging overlap between the appearances, septic arthritis is unlikely. 2. Solid L5-S1 fusion and decompression. 3. 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   Electronically Signed   By: Juliene Pina  Lamke M.D.   On: 08/22/2014 08:25   Ct Abdomen Pelvis W Contrast  08/20/2014   CLINICAL DATA:  Right upper quadrant abdominal pain for 1 week with nausea and vomiting. History of appendectomy, hernia repair and systemic lupus erythematosus. Moderate leukocytosis. Initial encounter.  EXAM: CT ABDOMEN AND PELVIS WITH CONTRAST   TECHNIQUE: Multidetector CT imaging of the abdomen and pelvis was performed using the standard protocol following bolus administration of intravenous contrast.  CONTRAST:  100 ml Omnipaque 300  COMPARISON:  Ultrasound same date.  FINDINGS: Minimal dependent atelectasis at both lung bases. Otherwise clear. No pleural or pericardial effusion.  Lower chest: Mildly decreased hepatic density suspicious for steatosis. No focal lesions identified.  Hepatobiliary: The liver is normal in density without focal abnormality. No evidence of gallstones, gallbladder wall thickening or biliary dilatation.  Pancreas: Unremarkable. No pancreatic ductal dilatation or surrounding inflammatory changes.  Spleen: Normal in size without focal abnormality.  Adrenals/Urinary Tract: Both adrenal glands appear normal.The left kidney appears normal. The right kidney demonstrates hydronephrosis and delayed contrast excretion. There is asymmetric perinephric soft tissue stranding on the right. There is a complex fluid collection or centrally necrotic mass adjacent to the ureteropelvic junction, measuring 5.6 x 3.2 cm transverse on image 46. This extends into the right psoas muscle. No urinary tract calculi demonstrated. The distal ureters and bladder appear unremarkable.  Stomach/Bowel: No evidence of bowel wall thickening, distention or surrounding inflammatory change.Mild sigmoid colon diverticulosis without surrounding inflammation.  Vascular/Lymphatic: There are several mildly prominent retroperitoneal lymph nodes, likely reactive. No mesenteric adenopathy. Mild aortoiliac atherosclerosis. The IVC and renal veins appear patent.  Reproductive: The uterus appears unremarkable.  No adnexal mass.  Other: No evidence of abdominal wall mass or hernia.  Musculoskeletal: No acute or significant osseous findings. Mild lumbar spondylosis noted. There are postsurgical changes at the lumbosacral junction with a grade 1 anterolisthesis. Moderate facet  hypertrophy noted at L4-5. The paraspinal fat planes are maintained. No evidence of discitis or osteomyelitis P  IMPRESSION: 1. Suspected abscess within the right retroperitoneum involving the psoas muscle and obstructing the right ureteral pelvic junction. This is an atypical location, and necrotic neoplasm cannot be completely excluded. No other cause for ureteral obstruction identified. 2. Small retroperitoneal lymph nodes are probably reactive. No other suspicious masses identified. 3. No evidence of urinary tract calculus. 4. Urology consultation recommended. Results were discussed by telephone with Dr. James.   Electronically Signed   By: William  Veazey M.D.   On: 08/20/2014 14:38   Ir Nephrostomy Placement Right  08/21/2014   CLINICAL DATA:  54 year old female with sepsis secondary to right psoas/ perinephric abscess. This abscess collection results in at least partial obstruction of the proximal ureter and right hydronephrosis. Additionally, there is clinical concern for possible pyonephrosis of the obstructed kidney. Therefore, percutaneous nephrostomy tube placement is warranted. This will then be followed by percutaneous drainage of the psoas/perinephric abscess.  EXAM: IR NEPHROSTOMY PLACEMENT RIGHT  Date: 08/21/2014  PROCEDURE: 1. Ultrasound-guided puncture right renal collecting system 2. Placement of a 10 French percutaneous nephrostomy tube using fluoroscopic guidance Interventional Radiologist:  Heath K. McCullough, MD  ANESTHESIA/SEDATION: Moderate (conscious) sedation was used. 2 mg Versed, 100 mcg Fentanyl were administered intravenously. The patient's vital signs were monitored continuously by radiology nursing throughout the procedure.  Sedation Time: 25 minutes  MEDICAT  TECHNIQUE: Informed consent was obtained from the patient following explanation of  the procedure, risks, benefits and alternatives. The patient understands, agrees and consents for the procedure. All questions were addressed. A time out was performed.  Maximal barrier sterile technique utilized including caps, mask, sterile gowns, sterile gloves, large sterile drape, hand hygiene, and Betadine skin prep.  The right flank was interrogated with ultrasound. A suitable posterior inferior calyx was identified. Local anesthesia was attained by infiltration with 1% lidocaine. A small dermatotomy was made. Using real-time ultrasound guidance, the posterior inferior calyx was punctured with a 21 gauge Accustick needle. Free return of turbid urine from the needle once the stylet was removed confirmed its location within the collecting system. A gentle hand injection of contrast material opacified the selected lower pole calyx and infundibulum.  A night tracks wire was then advanced into the renal pelvis. The Accustick needle was exchanged for the Accustick sheath. A Bentson wire was then coiled in the renal pelvis and the Accustick sheath removed. The skin tract was dilated to 10 [place] and a [place] all-purpose drainage catheter was then advanced over the wire and formed with the locking loop in the renal pelvis. A final image was obtained after confirming location within the renal pelvis by gentle hand injection of contrast.  The nephrostomy tube was then secured to the skin with 0 Prolene suture and a sterile bandage. The catheter was connected to gravity bag drainage. The patient tolerated the procedure well.  COMPLICATIONS: None  IMPRESSION: Technically successful placement of a 10 French right-sided percutaneous nephrostomy tube.  Signed,  Sterling Big, MD  [facility]  [facility]   Electronically Signed   By: Malachy Moan M.D.   On: 08/21/2014 16:52   Ct Image Guided Drainage By Percutaneous Catheter  08/21/2014   CLINICAL DATA:   54 year old female with a right retroperitoneal abscess and associated hydronephrosis. CT-guided drain placement is warranted in the setting of sepsis.  EXAM: CT IMAGE GUIDED DRAINAGE BY PERCUTANEOUS CATHETER  Date: 08/21/2014  PROCEDURE: 1. CT-guided drain placement in the right psoas muscle/perinephric space Interventional Radiologist:  Sterling Big, MD  ANESTHESIA/SEDATION: Moderate (conscious) sedation was used. 0.5 mg Versed, 25 mcg Fentanyl were administered intravenously. The patient's vital signs were monitored continuously by radiology nursing throughout the procedure.  Sedation Time: 15 minutes  MEDICATIONS: 2 g Ancef given within 1 hour of skin incision  TECHNIQUE: Informed consent was obtained from the patient following explanation of the procedure, risks, benefits and alternatives. The patient understands, agrees and consents for the procedure. All questions were addressed. A time out was performed.  A planning axial CT scan was performed. The fluid collection within the right psoas musculature was successfully identified. A suitable skin entry site was selected and marked. The region was then sterilely prepped and draped in standard fashion using Betadine skin prep. Local anesthesia was attained by infiltration with 1% lidocaine. A small dermatotomy was made. Under intermittent CT fluoroscopic guidance, an 18 gauge introducer needle was advanced into the fluid collection. An Amplatz wire was then coiled within the fluid collection in the skin tract dilated to 12 [place]. A Cook 12 [place] all-purpose drainage catheter was then advanced over the wire and formed within the fluid collection. Aspiration yields 20 mL of thick, frankly purulent material. A sample was sent for Gram stain and culture.  Axial CT imaging was then performed confirming good positioning of the drainage catheter. The fluid collection has  notably decreased in size. The drainage tube was then secured to the skin with 0 Prolene  suture and an adhesive fixation device. The tube was connected to JP bulb suction.  COMPLICATIONS: None  IMPRESSION: Successful placement of a 12 French percutaneous drainage catheter in the right psoas/perinephric fluid collection.  Aspiration yields 20 mL thick, frankly purulent material. Sample was sent for culture.  PLAN: Once drainage has been minimal (less than 20 mL) for several days, recommend repeat CT scan with contrast to ensure resolution of the abscess prior to tube removal.  Signed,  Sterling Big, MD  [facility]  [facility]   Electronically Signed   By: Malachy Moan M.D.   On: 08/21/2014 18:00   US Abdomen Limited Ruq  08/20/2014   CLINICAL DATA:  Sharp RIGHT upper quadrant pain for 1 week, history lupus, rheumatoid arthritis  EXAM: US ABDOMEN LIMITED - RIGHT UPPER QUADRANT  COMPARISON:  None  FINDINGS: Gallbladder:  Minimal dependent sludge in gallbladder. No shadowing gallstones, gallbladder wall thickening or pericholecystic fluid.  Common bile duct:  Diameter: Normal caliber 3 mm diameter  Liver:  Echogenic, likely fatty infiltration, though this can be seen with cirrhosis and certain infiltrative disorders. No focal hepatic mass or nodularity. Hepatopetal portal venous flow.  No RIGHT upper quadrant free fluid.  IMPRESSION: Probable fatty infiltration of liver as above.  Minimal gallbladder sludge.  Otherwise negative exam.   Electronically Signed   By: Ulyses Southward M.D.   On: 08/20/2014 13:04    Labs:  CBC:  Recent Labs  08/21/14 1225 08/22/14 0511 08/23/14 0419 08/24/14 0415  WBC 5.6 7.8 5.0 5.5  HGB 11.7* 11.3* 10.1* 10.6*  HCT 35.5* 33.4* 31.1* 32.0*  PLT 381 387 374 450*    COAGS:  Recent Labs  08/20/14 2005  INR 1.08    BMP:  Recent Labs  08/21/14 1225 08/22/14 0511 08/23/14 0419 08/24/14 0415  NA 138 137 140 143  K 3.1* 2.9* 3.6 3.6  CL 104 101 107 106  CO2 23 25 26 27   GLUCOSE 93 89 120* 95   BUN 11 9 10 7   CALCIUM 8.2* 8.3* 8.4* 8.6*  CREATININE 0.65 0.59 0.56 0.47  GFRNONAA >60 >60 >60 >60  GFRAA >60 >60 >60 >60    LIVER FUNCTION TESTS:  Recent Labs  08/20/14 1140 08/21/14 1225  BILITOT 0.8 0.9  AST 96* 40  ALT 56* 35  ALKPHOS 246* 185*  PROT 7.3 6.1*  ALBUMIN 2.9* 2.3*    Assessment and Plan: S/p right PCN,rt psoas abscess drainage 6/1; currently afebrile; WBC /creat nl; hgb 10.6; CT chest 6/4 with no PE; MRI L spine 6/3 with no overt findings of spinal infection but L4-5 facet inflamm changes;  if output from psoas drain cont to be minimal would obtain f/u CT with IV contrast next week to assess adequacy of drainage; other plans as per urology/IM/ID  Signed: D. 8/4 08/24/2014, 11:25 AM   I spent a total of 15 minutes in face to face in clinical consultation/evaluation, greater than 50% of which was counseling/coordinating care for right psaos abscess drain, right nephrostomy

## 2014-08-25 ENCOUNTER — Encounter (HOSPITAL_COMMUNITY)
Admission: EM | Disposition: A | Discharge status: Skilled Nursing Facility | Payer: Self-pay | Source: Home / Self Care | Attending: Internal Medicine

## 2014-08-25 ENCOUNTER — Encounter (HOSPITAL_COMMUNITY): Payer: Self-pay

## 2014-08-25 ENCOUNTER — Inpatient Hospital Stay (HOSPITAL_COMMUNITY): Payer: Medicare Other

## 2014-08-25 DIAGNOSIS — I340 Nonrheumatic mitral (valve) insufficiency: Secondary | ICD-10-CM

## 2014-08-25 DIAGNOSIS — N135 Crossing vessel and stricture of ureter without hydronephrosis: Secondary | ICD-10-CM

## 2014-08-25 HISTORY — PX: TEE WITHOUT CARDIOVERSION: SHX5443

## 2014-08-25 LAB — BASIC METABOLIC PANEL
Anion gap: 9 (ref 5–15)
BUN: 8 mg/dL (ref 6–20)
CHLORIDE: 105 mmol/L (ref 101–111)
CO2: 28 mmol/L (ref 22–32)
Calcium: 8.8 mg/dL — ABNORMAL LOW (ref 8.9–10.3)
Creatinine, Ser: 0.52 mg/dL (ref 0.44–1.00)
GFR calc Af Amer: >60 mL/min (ref >60)
Glucose, Bld: 95 mg/dL (ref 65–99)
POTASSIUM: 3.5 mmol/L (ref 3.5–5.1)
SODIUM: 142 mmol/L (ref 135–145)

## 2014-08-25 LAB — ANAEROBIC CULTURE

## 2014-08-25 SURGERY — ECHOCARDIOGRAM, TRANSESOPHAGEAL
Anesthesia: Moderate Sedation

## 2014-08-25 MED ORDER — FENTANYL CITRATE (PF) 100 MCG/2ML IJ SOLN
INTRAMUSCULAR | Status: DC | PRN
  Administered 2014-08-25 (×2): 25 ug via INTRAVENOUS

## 2014-08-25 MED ORDER — MIDAZOLAM HCL 10 MG/2ML IJ SOLN
INTRAMUSCULAR | Status: DC | PRN
  Administered 2014-08-25: 2 mg via INTRAVENOUS
  Administered 2014-08-25: 1 mg via INTRAVENOUS
  Administered 2014-08-25 (×2): 2 mg via INTRAVENOUS

## 2014-08-25 MED ORDER — BUTAMBEN-TETRACAINE-BENZOCAINE 2-2-14 % EX AERO
INHALATION_SPRAY | CUTANEOUS | Status: DC | PRN
  Administered 2014-08-25: 2 via TOPICAL

## 2014-08-25 MED ORDER — NAPROXEN 375 MG PO TABS
375.0000 mg | ORAL_TABLET | Freq: Two times a day (BID) | ORAL | Status: AC
  Administered 2014-08-25 – 2014-08-27 (×4): 375 mg via ORAL
  Filled 2014-08-25 (×4): qty 1

## 2014-08-25 NOTE — Progress Notes (Addendum)
[facility]    Subjective: Continues to have some right side breast/ chest tenderness, notes she is able to have increased ROM of her right hip, overall feeling better.   Antibiotics:  Anti-infectives    Start     Dose/Rate Route Frequency Ordered Stop   08/22/14 1400  ceFAZolin (ANCEF) IVPB 2 g/50 mL premix     2 g 100 mL/hr over 30 Minutes Intravenous 3 times per day 08/22/14 1209     08/22/14 0200  vancomycin (VANCOCIN) IVPB 1000 mg/200 mL premix  Status:  Discontinued     1,000 mg 200 mL/hr over 60 Minutes Intravenous Every 12 hours 08/21/14 1213 08/23/14 0942   08/21/14 1500  cefTRIAXone (ROCEPHIN) 1 g in dextrose 5 % 50 mL IVPB  Status:  Discontinued     1 g 100 mL/hr over 30 Minutes Intravenous  Once 08/20/14 1702 08/21/14 1206   08/21/14 1300  vancomycin (VANCOCIN) 1,500 mg in sodium chloride 0.9 % 500 mL IVPB     1,500 mg 250 mL/hr over 120 Minutes Intravenous  Once 08/21/14 1213 08/21/14 1700   08/21/14 1230  Ampicillin-Sulbactam (UNASYN) 3 g in sodium chloride 0.9 % 100 mL IVPB  Status:  Discontinued     3 g 100 mL/hr over 60 Minutes Intravenous 4 times per day 08/21/14 1208 08/22/14 1209   08/20/14 2033  ceFAZolin (ANCEF) 2-3 GM-% IVPB SOLR    Comments:  Kevan Ny   : cabinet override      08/20/14 2033 08/20/14 2042   08/20/14 1515  cefTRIAXone (ROCEPHIN) 1 g in dextrose 5 % 50 mL IVPB     1 g 100 mL/hr over 30 Minutes Intravenous  Once 08/20/14 1504 08/20/14 1613      Medications: Scheduled Meds: . acetaminophen  1,000 mg Oral TID  . ALPRAZolam  1 mg Oral BID  .  ceFAZolin (ANCEF) IV  2 g Intravenous 3 times per day  . folic acid  1 mg Oral Daily  . heparin  5,000 Units Subcutaneous 3 times per day  . naproxen  375 mg Oral BID WC  . pantoprazole  40 mg Oral Daily  . polyethylene glycol  17 g Oral Daily  . predniSONE  2.5 mg Oral BID WC  . senna  2 tablet Oral QHS  . sodium chloride  1,000 mL Intravenous Once  . sodium  chloride  3 mL Intravenous Q12H  . thiamine  100 mg Oral Daily   Continuous Infusions: . sodium chloride 10 mL/hr at 08/24/14 1446   PRN Meds:.morphine injection, ondansetron **OR** ondansetron (ZOFRAN) IV, oxyCODONE    Objective: Weight change:   Intake/Output Summary (Last 24 hours) at 08/25/14 1554 Last data filed at 08/25/14 1355  Gross per 24 hour  Intake    440 ml  Output   3455 ml  Net  -3015 ml   Blood pressure 136/79, pulse 94, temperature 98.1 F (36.7 C), temperature source Oral, resp. rate 18, height 5' 2.5" (1.588 m), weight 157 lb 3 oz (71.3 kg), SpO2 95 %. Temp:  [98.1 F (36.7 C)-98.2 F (36.8 C)] 98.1 F (36.7 C) (06/06 1355) Pulse Rate:  [71-94] 94 (06/06 1355) Resp:  [9-23] 18 (06/06 1355) BP: (109-145)/(53-87) 136/79 mmHg (06/06 1355) SpO2:  [91 %-99 %] 95 % (06/06 1355)  Physical Exam: General: resting in bed in NAD HEENT: EOMI, no scleral icterus Cardiac: RRR, no rubs, murmurs or gallops Pulm: CTAB Abd: soft, nontender, nondistended, BS present,  JP drain with minimal output. Ext: warm and well perfused, no pedal edema Neuro: non focal  CBC: Lab Results  Component Value Date   WBC 5.5 08/24/2014   HGB 10.6* 08/24/2014   HCT 32.0* 08/24/2014   MCV 102.9* 08/24/2014   PLT 450* 08/24/2014      BMET  Recent Labs  08/24/14 0415 08/25/14 0430  NA 143 142  K 3.6 3.5  CL 106 105  CO2 27 28  GLUCOSE 95 95  BUN 7 8  CREATININE 0.47 0.52  CALCIUM 8.6* 8.8*     Liver Panel  No results for input(s): PROT, ALBUMIN, AST, ALT, ALKPHOS, BILITOT, BILIDIR, IBILI in the last 72 hours.     Sedimentation Rate No results for input(s): ESRSEDRATE in the last 72 hours. C-Reactive Protein No results for input(s): CRP in the last 72 hours.  Micro Results: Recent Results (from the past 720 hour(s))  Urine culture     Status: None   Collection Time: 08/20/14  1:15 PM  Result Value Ref Range Status   Specimen Description URINE, CLEAN CATCH   Final   Special Requests NONE  Final   Colony Count   Final    >=100,000 COLONIES/ML Performed at [facility]    Culture   Final    STAPHYLOCOCCUS AUREUS Note: RIFAMPIN AND GENTAMICIN SHOULD NOT BE USED AS SINGLE DRUGS FOR TREATMENT OF STAPH INFECTIONS. Performed at [facility]    Report Status 08/22/2014 FINAL  Final   Organism ID, Bacteria STAPHYLOCOCCUS AUREUS  Final      Susceptibility   Staphylococcus aureus - MIC*    GENTAMICIN <=0.5 SENSITIVE Sensitive     LEVOFLOXACIN >=8 RESISTANT Resistant     NITROFURANTOIN <=16 SENSITIVE Sensitive     OXACILLIN 0.5 SENSITIVE Sensitive     PENICILLIN RESISTANT      RIFAMPIN <=0.5 SENSITIVE Sensitive     TRIMETH/SULFA <=10 SENSITIVE Sensitive     VANCOMYCIN 1 SENSITIVE Sensitive     TETRACYCLINE <=1 SENSITIVE Sensitive     * STAPHYLOCOCCUS AUREUS  Culture, blood (routine x 2)     Status: None   Collection Time: 08/20/14  3:24 PM  Result Value Ref Range Status   Specimen Description BLOOD RIGHT ANTECUBITAL  Final   Special Requests BOTTLES DRAWN AEROBIC AND ANAEROBIC 8CC  Final   Culture   Final    STAPHYLOCOCCUS AUREUS Note: RIFAMPIN AND GENTAMICIN SHOULD NOT BE USED AS SINGLE DRUGS FOR TREATMENT OF STAPH INFECTIONS. SUSCEPTIBILITIES PERFORMED ON PREVIOUS CULTURE WITHIN THE LAST 5 DAYS. Note: Gram Stain Report Called to,Read Back By and Verified With:  ARMSTRONG S @ 0715 08/21/2014 BY RESSEGGER RESISTANT Performed at [facility]    Report Status 08/23/2014 FINAL  Final  Culture, blood (routine x 2)     Status: None   Collection Time: 08/20/14  3:33 PM  Result Value Ref Range Status   Specimen Description BLOOD RIGHT HAND  Final   Special Requests BOTTLES DRAWN AEROBIC ONLY 4CC  Final   Culture   Final    STAPHYLOCOCCUS AUREUS Note: RIFAMPIN AND GENTAMICIN SHOULD NOT BE USED AS SINGLE DRUGS FOR TREATMENT OF STAPH INFECTIONS. Note: Gram Stain Report Called to,Read Back By and Verified With: ARMSTRONG S  [facility]) AT 0715 ON 08/21/2014 BY RESSEGGER R Performed at [facility]    Report Status 08/23/2014 FINAL  Final   Organism ID, Bacteria STAPHYLOCOCCUS AUREUS  Final      Susceptibility   Staphylococcus aureus -  MIC*    CLINDAMYCIN <=0.25 SENSITIVE Sensitive     ERYTHROMYCIN <=0.25 SENSITIVE Sensitive     GENTAMICIN <=0.5 SENSITIVE Sensitive     LEVOFLOXACIN >=8 RESISTANT Resistant     OXACILLIN 0.5 SENSITIVE Sensitive     PENICILLIN RESISTANT      RIFAMPIN <=0.5 SENSITIVE Sensitive     TRIMETH/SULFA <=10 SENSITIVE Sensitive     VANCOMYCIN <=0.5 SENSITIVE Sensitive     TETRACYCLINE <=1 SENSITIVE Sensitive     MOXIFLOXACIN 4 RESISTANT Resistant     * STAPHYLOCOCCUS AUREUS  MRSA PCR Screening     Status: Abnormal   Collection Time: 08/20/14  7:48 PM  Result Value Ref Range Status   MRSA by PCR POSITIVE (A) NEGATIVE Final    Comment:        The GeneXpert MRSA Assay (FDA approved for NASAL specimens only), is one component of a comprehensive MRSA colonization surveillance program. It is not intended to diagnose MRSA infection nor to guide or monitor treatment for MRSA infections. RESULT CALLED TO, READ BACK BY AND VERIFIED WITH: ARMSTRONG,S RN [phone] [id number] [place],N RESULT CALLED TO, READ BACK BY AND VERIFIED WITH: ARMSTRONG,S RN [phone] [id number] [place],N RESULT CALLED TO, READ BACK BY AND VERIFIED WITH: ARSTRONG,S RN [id number] [place],N   Culture, routine-abscess     Status: None   Collection Time: 08/20/14 10:16 PM  Result Value Ref Range Status   Specimen Description ABSCESS  Final   Special Requests NONE  Final   Gram Stain   Final    MODERATE WBC PRESENT,BOTH PMN AND MONONUCLEAR NO SQUAMOUS EPITHELIAL CELLS SEEN MODERATE GRAM POSITIVE COCCI IN PAIRS IN CLUSTERS Performed at [facility]    Culture   Final    ABUNDANT STAPHYLOCOCCUS AUREUS Note: RIFAMPIN AND GENTAMICIN SHOULD NOT BE USED AS SINGLE DRUGS FOR TREATMENT OF STAPH  INFECTIONS. Performed at [facility]    Report Status 08/23/2014 FINAL  Final   Organism ID, Bacteria STAPHYLOCOCCUS AUREUS  Final      Susceptibility   Staphylococcus aureus - MIC*    CLINDAMYCIN <=0.25 SENSITIVE Sensitive     ERYTHROMYCIN <=0.25 SENSITIVE Sensitive     GENTAMICIN <=0.5 SENSITIVE Sensitive     LEVOFLOXACIN >=8 RESISTANT Resistant     OXACILLIN 0.5 SENSITIVE Sensitive     PENICILLIN 0.12 SENSITIVE Sensitive     RIFAMPIN <=0.5 SENSITIVE Sensitive     TRIMETH/SULFA <=10 SENSITIVE Sensitive     VANCOMYCIN 1 SENSITIVE Sensitive     TETRACYCLINE <=1 SENSITIVE Sensitive     MOXIFLOXACIN 4 RESISTANT Resistant     * ABUNDANT STAPHYLOCOCCUS AUREUS  Anaerobic culture     Status: None   Collection Time: 08/20/14 10:16 PM  Result Value Ref Range Status   Specimen Description ABSCESS  Final   Special Requests NONE  Final   Gram Stain   Final    MODERATE WBC PRESENT,BOTH PMN AND MONONUCLEAR NO SQUAMOUS EPITHELIAL CELLS SEEN MODERATE GRAM POSITIVE COCCI IN PAIRS IN CLUSTERS Performed at [facility]    Culture   Final    NO ANAEROBES ISOLATED Performed at [facility]    Report Status 08/25/2014 FINAL  Final  Blood Cultures x 2 sites     Status: None (Preliminary result)   Collection Time: 08/21/14  4:50 PM  Result Value Ref Range Status   Specimen Description BLOOD RIGHT HAND  Final   Special Requests BOTTLES DRAWN AEROBIC ONLY 10CC  Final   Culture   Final  BLOOD CULTURE RECEIVED NO GROWTH TO DATE CULTURE WILL BE HELD FOR 5 DAYS BEFORE ISSUING A FINAL NEGATIVE REPORT Performed at [facility]    Report Status PENDING  Incomplete  Blood Cultures x 2 sites     Status: None (Preliminary result)   Collection Time: 08/21/14  5:05 PM  Result Value Ref Range Status   Specimen Description BLOOD RIGHT ARM  Final   Special Requests BOTTLES DRAWN AEROBIC AND ANAEROBIC 10CC  Final   Culture   Final           BLOOD CULTURE RECEIVED  NO GROWTH TO DATE CULTURE WILL BE HELD FOR 5 DAYS BEFORE ISSUING A FINAL NEGATIVE REPORT Performed at [facility]    Report Status PENDING  Incomplete    Studies/Results: No results found.    Assessment/Plan:  Principal Problem:   Perinephric abscess Active Problems:   Pyelonephritis   Hydronephrosis   Lupus   Rheumatoid arthritis   Anxiety   GERD without esophagitis   Alcohol abuse   Transaminitis   Abscess   Ureteral obstruction   Screening for STD (sexually transmitted disease)   Psoas abscess   Staphylococcus aureus bacteremia with sepsis   Right-sided chest pain    Deanna Ball is a 54 y.o. female with  RA/SLE, alcoholism, found to have a perinephric abscess extending to psoas muscle and ureteral obstruction due to MSSA.  Perinephric abscess/ MSSA bacteremia/ Psoas Abscess - TEE negative for vegetation - Repeat BCx from 6/2 NGTD -  Likely needs 8 weeks of treatment with Ancef due to possible Diskitis. - Abscess is drained, minimal output, radiology plans follow up CT abd w IV contrast later this week .  LOS: 5 days   Gust Rung 08/25/2014, 3:54 PM

## 2014-08-25 NOTE — Progress Notes (Signed)
PATIENT DETAILS Name: RONDI IVY Age: 54 y.o. Sex: female Date of Birth: [birth year] Admit Date: 08/20/2014 Admitting Physician Ozella Rocks, MD PCP:No primary care provider on file.  Brief Narrative: 54 yo female with hx of SLE on chronic prednisone/MTX admitted with worsening right sided flank pain. Found to have right perinephric abscess causing right sided hydronephroses and MSSA bacteremia.   Subjective: Continues to have right sided chest pain,low back pain and pain in her right flank area-but overall better. But able to lift her leg leg much more today  Assessment/Plan: Principal Problem: Abscess right retroperitoneum/perinephric abscess involving the psoas muscle and obstructing the right ureteral pelvic junction causing hydronephrosis: Underwent placement of right percutaneous nephrostomy tube and percutaneous drain into the right psoas/perinephric abscess by interventional radiology on 6/1. Abscess culture, blood culture, urine culture positive for MSSA.  TTE and TEE. negative for vegetations,. Continue IV Ancef, await further recommendations from infectious disease and Urology.Repeat blood culture on 6/2 negative so far, will place PICC prior to discharge.  Active Problems: Sepsis: Secondary to above and MSSA bacteremia. Sepsis pathophysiology resolved.  Cultures and antibiotics as above.  MSSA bacteremia: See above.   Right-sided chest pain:Pleuritic-CT Chest negative for PE. Follow.Supportive care-add NSAID's  Hypokalemia: Repleted  History of lupus: Continue prednisone, hold methotrexate.  History of alcohol abuse: Drinks 2 bottles of wine daily. Last week was 6 days back. No signs of withdrawal currently. Continue close monitoring  GERD: PPI.  Anxiety: Continue as needed Xanax.  Disposition: Remain inpatient-will require several more days of hospitalization-suspect SNF on discharge  Antimicrobial agents  See below  Anti-infectives    Start     Dose/Rate Route Frequency Ordered Stop   08/22/14 1400  ceFAZolin (ANCEF) IVPB 2 g/50 mL premix     2 g 100 mL/hr over 30 Minutes Intravenous 3 times per day 08/22/14 1209     08/22/14 0200  vancomycin (VANCOCIN) IVPB 1000 mg/200 mL premix  Status:  Discontinued     1,000 mg 200 mL/hr over 60 Minutes Intravenous Every 12 hours 08/21/14 1213 08/23/14 0942   08/21/14 1500  cefTRIAXone (ROCEPHIN) 1 g in dextrose 5 % 50 mL IVPB  Status:  Discontinued     1 g 100 mL/hr over 30 Minutes Intravenous  Once 08/20/14 1702 08/21/14 1206   08/21/14 1300  vancomycin (VANCOCIN) 1,500 mg in sodium chloride 0.9 % 500 mL IVPB     1,500 mg 250 mL/hr over 120 Minutes Intravenous  Once 08/21/14 1213 08/21/14 1700   08/21/14 1230  Ampicillin-Sulbactam (UNASYN) 3 g in sodium chloride 0.9 % 100 mL IVPB  Status:  Discontinued     3 g 100 mL/hr over 60 Minutes Intravenous 4 times per day 08/21/14 1208 08/22/14 1209   08/20/14 2033  ceFAZolin (ANCEF) 2-3 GM-% IVPB SOLR    Comments:  Kevan Ny   : cabinet override      08/20/14 2033 08/20/14 2042   08/20/14 1515  cefTRIAXone (ROCEPHIN) 1 g in dextrose 5 % 50 mL IVPB     1 g 100 mL/hr over 30 Minutes Intravenous  Once 08/20/14 1504 08/20/14 1613      DVT Prophylaxis: Prophylactic  Heparin   Code Status: Full code   Family Communication Mother at bedside  Procedures: Perc Nephrostomy/Abscess drain 6/1  CONSULTS:  ID and urology   IR  Time spent 25 minutes-Greater than 50% of this time was  spent in counseling, explanation of diagnosis, planning of further management, and coordination of care.  MEDICATIONS: Scheduled Meds: . acetaminophen  1,000 mg Oral TID  . ALPRAZolam  1 mg Oral BID  .  ceFAZolin (ANCEF) IV  2 g Intravenous 3 times per day  . folic acid  1 mg Oral Daily  . heparin  5,000 Units Subcutaneous 3 times per day  . pantoprazole  40 mg Oral Daily  . polyethylene glycol  17 g Oral Daily  . predniSONE  2.5 mg Oral BID WC    . senna  2 tablet Oral QHS  . sodium chloride  1,000 mL Intravenous Once  . sodium chloride  3 mL Intravenous Q12H  . thiamine  100 mg Oral Daily   Continuous Infusions: . sodium chloride 10 mL/hr at 08/24/14 1446   PRN Meds:.morphine injection, ondansetron **OR** ondansetron (ZOFRAN) IV, oxyCODONE    PHYSICAL EXAM: Vital signs in last 24 hours: Filed Vitals:   08/25/14 1218 08/25/14 1220 08/25/14 1230 08/25/14 1355  BP: 123/60 119/65 115/60 136/79  Pulse: 75 75 72 94  Temp:    98.1 F (36.7 C)  TempSrc:    Oral  Resp: 16 17 20 18   Height:      Weight:      SpO2: 95% 95% 97% 95%    Weight change:  Filed Weights   08/20/14 1030 08/20/14 1824  Weight: 70.308 kg (155 lb) 71.3 kg (157 lb 3 oz)   Body mass index is 28.27 kg/(m^2).   Gen Exam: Awake and alert with clear speech.  Neck: Supple, No JVD.  Chest: B/L Clear.  No rales/rhochi CVS: S1 S2 Regular Abdomen: soft, BS +, non tender, non distended.  Extremities: no edema, lower extremities warm to touch Neurologic: Non Focal-sensation intact Wounds: N/A.    Intake/Output from previous day:  Intake/Output Summary (Last 24 hours) at 08/25/14 1358 Last data filed at 08/25/14 1355  Gross per 24 hour  Intake 1334.83 ml  Output   3705 ml  Net -2370.17 ml     LAB RESULTS: CBC  Recent Labs Lab 08/20/14 1140 08/21/14 1225 08/22/14 0511 08/23/14 0419 08/24/14 0415  WBC 16.3* 5.6 7.8 5.0 5.5  HGB 12.8 11.7* 11.3* 10.1* 10.6*  HCT 38.0 35.5* 33.4* 31.1* 32.0*  PLT 491* 381 387 374 450*  MCV 103.3* 102.9* 100.3* 103.0* 102.9*  MCH 34.8* 33.9 33.9 33.4 34.1*  MCHC 33.7 33.0 33.8 32.5 33.1  RDW 13.1 13.1 13.0 13.3 13.4  LYMPHSABS 1.3  --   --   --   --   MONOABS 1.0  --   --   --   --   EOSABS 0.0  --   --   --   --   BASOSABS 0.0  --   --   --   --     Chemistries   Recent Labs Lab 08/21/14 1225 08/22/14 0511 08/23/14 0419 08/24/14 0415 08/25/14 0430  NA 138 137 140 143 142  K 3.1* 2.9* 3.6 3.6  3.5  CL 104 101 107 106 105  CO2 23 25 26 27 28   GLUCOSE 93 89 120* 95 95  BUN 11 9 10 7 8   CREATININE 0.65 0.59 0.56 0.47 0.52  CALCIUM 8.2* 8.3* 8.4* 8.6* 8.8*  MG  --   --  1.9  --   --     CBG: No results for input(s): GLUCAP in the last 168 hours.  GFR Estimated Creatinine Clearance: 73.5 mL/min (by  C-G formula based on Cr of 0.52).  Coagulation profile  Recent Labs Lab 08/20/14 2005  INR 1.08    Cardiac Enzymes  Recent Labs Lab 08/22/14 0952  TROPONINI <0.03    Invalid input(s): POCBNP No results for input(s): DDIMER in the last 72 hours. No results for input(s): HGBA1C in the last 72 hours. No results for input(s): CHOL, HDL, LDLCALC, TRIG, CHOLHDL, LDLDIRECT in the last 72 hours. No results for input(s): TSH, T4TOTAL, T3FREE, THYROIDAB in the last 72 hours.  Invalid input(s): FREET3 No results for input(s): VITAMINB12, FOLATE, FERRITIN, TIBC, IRON, RETICCTPCT in the last 72 hours. No results for input(s): LIPASE, AMYLASE in the last 72 hours.  Urine Studies No results for input(s): UHGB, CRYS in the last 72 hours.  Invalid input(s): UACOL, UAPR, USPG, UPH, UTP, UGL, UKET, UBIL, UNIT, UROB, ULEU, UEPI, UWBC, URBC, UBAC, CAST, UCOM, BILUA  MICROBIOLOGY: Recent Results (from the past 240 hour(s))  Urine culture     Status: None   Collection Time: 08/20/14  1:15 PM  Result Value Ref Range Status   Specimen Description URINE, CLEAN CATCH  Final   Special Requests NONE  Final   Colony Count   Final    >=100,000 COLONIES/ML Performed at [facility]    Culture   Final    STAPHYLOCOCCUS AUREUS Note: RIFAMPIN AND GENTAMICIN SHOULD NOT BE USED AS SINGLE DRUGS FOR TREATMENT OF STAPH INFECTIONS. Performed at [facility]    Report Status 08/22/2014 FINAL  Final   Organism ID, Bacteria STAPHYLOCOCCUS AUREUS  Final      Susceptibility   Staphylococcus aureus - MIC*    GENTAMICIN <=0.5 SENSITIVE Sensitive     LEVOFLOXACIN >=8 RESISTANT  Resistant     NITROFURANTOIN <=16 SENSITIVE Sensitive     OXACILLIN 0.5 SENSITIVE Sensitive     PENICILLIN RESISTANT      RIFAMPIN <=0.5 SENSITIVE Sensitive     TRIMETH/SULFA <=10 SENSITIVE Sensitive     VANCOMYCIN 1 SENSITIVE Sensitive     TETRACYCLINE <=1 SENSITIVE Sensitive     * STAPHYLOCOCCUS AUREUS  Culture, blood (routine x 2)     Status: None   Collection Time: 08/20/14  3:24 PM  Result Value Ref Range Status   Specimen Description BLOOD RIGHT ANTECUBITAL  Final   Special Requests BOTTLES DRAWN AEROBIC AND ANAEROBIC 8CC  Final   Culture   Final    STAPHYLOCOCCUS AUREUS Note: RIFAMPIN AND GENTAMICIN SHOULD NOT BE USED AS SINGLE DRUGS FOR TREATMENT OF STAPH INFECTIONS. SUSCEPTIBILITIES PERFORMED ON PREVIOUS CULTURE WITHIN THE LAST 5 DAYS. Note: Gram Stain Report Called to,Read Back By and Verified With:  ARMSTRONG S @ 0715 08/21/2014 BY RESSEGGER RESISTANT Performed at [facility]    Report Status 08/23/2014 FINAL  Final  Culture, blood (routine x 2)     Status: None   Collection Time: 08/20/14  3:33 PM  Result Value Ref Range Status   Specimen Description BLOOD RIGHT HAND  Final   Special Requests BOTTLES DRAWN AEROBIC ONLY 4CC  Final   Culture   Final    STAPHYLOCOCCUS AUREUS Note: RIFAMPIN AND GENTAMICIN SHOULD NOT BE USED AS SINGLE DRUGS FOR TREATMENT OF STAPH INFECTIONS. Note: Gram Stain Report Called to,Read Back By and Verified With: ARMSTRONG S [facility]) AT 0715 ON 08/21/2014 BY RESSEGGER R Performed at [facility]    Report Status 08/23/2014 FINAL  Final   Organism ID, Bacteria STAPHYLOCOCCUS AUREUS  Final      Susceptibility  Staphylococcus aureus - MIC*    CLINDAMYCIN <=0.25 SENSITIVE Sensitive     ERYTHROMYCIN <=0.25 SENSITIVE Sensitive     GENTAMICIN <=0.5 SENSITIVE Sensitive     LEVOFLOXACIN >=8 RESISTANT Resistant     OXACILLIN 0.5 SENSITIVE Sensitive     PENICILLIN RESISTANT      RIFAMPIN <=0.5 SENSITIVE Sensitive     TRIMETH/SULFA <=10  SENSITIVE Sensitive     VANCOMYCIN <=0.5 SENSITIVE Sensitive     TETRACYCLINE <=1 SENSITIVE Sensitive     MOXIFLOXACIN 4 RESISTANT Resistant     * STAPHYLOCOCCUS AUREUS  MRSA PCR Screening     Status: Abnormal   Collection Time: 08/20/14  7:48 PM  Result Value Ref Range Status   MRSA by PCR POSITIVE (A) NEGATIVE Final    Comment:        The GeneXpert MRSA Assay (FDA approved for NASAL specimens only), is one component of a comprehensive MRSA colonization surveillance program. It is not intended to diagnose MRSA infection nor to guide or monitor treatment for MRSA infections. RESULT CALLED TO, READ BACK BY AND VERIFIED WITH: ARMSTRONG,S RN [phone] [place],N RESULT CALLED TO, READ BACK BY AND VERIFIED WITH: ARMSTRONG,S RN [phone] [id number] [place],N RESULT CALLED TO, READ BACK BY AND VERIFIED WITH: ARSTRONG,S RN [phone] COV[place],N   Culture, routine-abscess     Status: None   Collection Time: 08/20/14 10:16 PM  Result Value Ref Range Status   Specimen Description ABSCESS  Final   Special Requests NONE  Final   Gram Stain   Final    MODERATE WBC PRESENT,BOTH PMN AND MONONUCLEAR NO SQUAMOUS EPITHELIAL CELLS SEEN MODERATE GRAM POSITIVE COCCI IN PAIRS IN CLUSTERS Performed at [facility]    Culture   Final    ABUNDANT STAPHYLOCOCCUS AUREUS Note: RIFAMPIN AND GENTAMICIN SHOULD NOT BE USED AS SINGLE DRUGS FOR TREATMENT OF STAPH INFECTIONS. Performed at [facility]    Report Status 08/23/2014 FINAL  Final   Organism ID, Bacteria STAPHYLOCOCCUS AUREUS  Final      Susceptibility   Staphylococcus aureus - MIC*    CLINDAMYCIN <=0.25 SENSITIVE Sensitive     ERYTHROMYCIN <=0.25 SENSITIVE Sensitive     GENTAMICIN <=0.5 SENSITIVE Sensitive     LEVOFLOXACIN >=8 RESISTANT Resistant     OXACILLIN 0.5 SENSITIVE Sensitive     PENICILLIN 0.12 SENSITIVE Sensitive     RIFAMPIN <=0.5 SENSITIVE Sensitive     TRIMETH/SULFA <=10 SENSITIVE Sensitive     VANCOMYCIN 1  SENSITIVE Sensitive     TETRACYCLINE <=1 SENSITIVE Sensitive     MOXIFLOXACIN 4 RESISTANT Resistant     * ABUNDANT STAPHYLOCOCCUS AUREUS  Anaerobic culture     Status: None   Collection Time: 08/20/14 10:16 PM  Result Value Ref Range Status   Specimen Description ABSCESS  Final   Special Requests NONE  Final   Gram Stain   Final    MODERATE WBC PRESENT,BOTH PMN AND MONONUCLEAR NO SQUAMOUS EPITHELIAL CELLS SEEN MODERATE GRAM POSITIVE COCCI IN PAIRS IN CLUSTERS Performed at [facility]    Culture   Final    NO ANAEROBES ISOLATED Performed at [facility]    Report Status 08/25/2014 FINAL  Final  Blood Cultures x 2 sites     Status: None (Preliminary result)   Collection Time: 08/21/14  4:50 PM  Result Value Ref Range Status   Specimen Description BLOOD RIGHT HAND  Final   Special Requests BOTTLES DRAWN AEROBIC ONLY 10CC  Final   Culture  Final           BLOOD CULTURE RECEIVED NO GROWTH TO DATE CULTURE WILL BE HELD FOR 5 DAYS BEFORE ISSUING A FINAL NEGATIVE REPORT Performed at [facility]    Report Status PENDING  Incomplete  Blood Cultures x 2 sites     Status: None (Preliminary result)   Collection Time: 08/21/14  5:05 PM  Result Value Ref Range Status   Specimen Description BLOOD RIGHT ARM  Final   Special Requests BOTTLES DRAWN AEROBIC AND ANAEROBIC 10CC  Final   Culture   Final           BLOOD CULTURE RECEIVED NO GROWTH TO DATE CULTURE WILL BE HELD FOR 5 DAYS BEFORE ISSUING A FINAL NEGATIVE REPORT Performed at [facility]    Report Status PENDING  Incomplete    RADIOLOGY STUDIES/RESULTS: Dg Cervical Spine Complete  08/22/2014   CLINICAL DATA:  Rheumatoid arthritis, potential planning for transesophageal echocardiography, history lupus  EXAM: CERVICAL SPINE  4+ VIEWS  COMPARISON:  None  FINDINGS: Straightening of cervical lordosis question muscle spasm.  Prevertebral soft tissues normal to upper normal in thickness.  Vertebral body and  disc space heights maintained.  No acute fracture, subluxation or bone destruction.  Predental space normal.  Osseous neural foramina grossly patent.  Lung apices clear.  Facet alignments normal.  C1-C2 alignment normal.  IMPRESSION: No acute abnormalities.   Electronically Signed   By: Ulyses Southward M.D.   On: 08/22/2014 14:44   Ct Angio Chest Pe W/cm &/or Wo Cm  08/23/2014   CLINICAL DATA:  Chest pain question pulmonary embolism, chest pain began this morning on RIGHT side near breast, shortness of breath, cough, history lupus, rheumatoid arthritis  EXAM: CT ANGIOGRAPHY CHEST WITH CONTRAST  TECHNIQUE: Multidetector CT imaging of the chest was performed using the standard protocol during bolus administration of intravenous contrast. Multiplanar CT image reconstructions and MIPs were obtained to evaluate the vascular anatomy.  CONTRAST:  OMNIPAQUE IOHEXOL 350 MG/ML SOLN  COMPARISON:  None  FINDINGS: Aorta normal caliber without aneurysm or dissection.  Pulmonary arteries adequately opacified and patent.  No evidence of pulmonary embolism.  Visualized portion of liver and spleen normal appearance.  No thoracic adenopathy, though normal size lymph nodes are seen subcarinal and in both axilla.  Tiny BILATERAL pleural effusions with atelectasis in both lower lobes greater on LEFT.  6 mm diameter nodular density RIGHT upper lobe image 28.  6 mm nodular density LEFT lower lobe image 34.  Remaining lungs clear.  No pneumothorax or definite osseous findings.  Review of the MIP images confirms the above findings.  IMPRESSION: Small BILATERAL pleural effusions with subsegmental atelectasis in BILATERAL lower lobes.  No evidence pulmonary embolism.  BILATERAL 6 mm pulmonary nodules, recommendation below.  If the patient is at high risk for bronchogenic carcinoma, follow-up chest CT at 6-12 months is recommended. If the patient is at low risk for bronchogenic carcinoma, follow-up chest CT at 12 months is recommended. This  recommendation follows the consensus statement: Guidelines for Management of Small Pulmonary Nodules Detected on CT Scans: A Statement from the Fleischner Society as published in Radiology 2005;237:395-400.   Electronically Signed   By: Ulyses Southward M.D.   On: 08/23/2014 16:01   Mr Lumbar Spine W Wo Contrast  08/22/2014   CLINICAL DATA:  Retroperitoneal abscess. 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  EXAM: MRI LUMBAR SPINE WITHOUT AND WITH CONTRAST  TECHNIQUE: Multiplanar and multiecho pulse sequences of  the lumbar spine were obtained without and with intravenous contrast.  CONTRAST:  15mL MULTIHANCE GADOBENATE DIMEGLUMINE 529 MG/ML IV SOLN  COMPARISON:  CT 08/20/2014.  FINDINGS: Segmentation: The numbering convention used for this exam termed L5-S1 as the last intervertebral disc space. Numbering was correlated with prior CT.  Alignment: Grade I anterolisthesis of L5 on S1 is a chronic finding and probably associated with old pars defects. Decompression with solid posterior lateral fusion.  Vertebrae: Benign hemangioma present at T12. Degenerative endplate changes are present at L1-L2. L4 inferior endplate edema appears discogenic and shows mild post gadolinium enhancement. This is unlikely to represent discogenic infection given the degenerated disc and vacuum disc seen on prior CT 08/20/2014.  Conus medullaris: Normal termination at L1.  Paraspinal tissues: RIGHT nephrostomy and RIGHT sella is drains noted. No definite interval change compared to prior abdominal CT.  Disc levels:  Disc Signal: Disc degeneration at L4-L5. Partial ossification of the L5-S1 disc associated with posterior lateral fusion and posterior decompression.  L1-L2:  Negative.  L2-L3:  Facet hypertrophy without stenosis.  Normal disc.  L3-L4:  Mild facet degeneration.  No stenosis.  L4-L5: There is distraction of the LEFT facet joint with some fluid in the joint and enhancing granulation tissue or synovitis in the posterior  aspect of the joint. The joint is degenerated and given the distraction, this is most compatible with ordinary degenerative changes and facet osteoarthritis. Infection of the facet joint is considered unlikely. There is little if any periarticular edema around the LEFT L4-L5 facet joint. The RIGHT facet joint appears normal. An isolated septic facet arthritis even in the setting of psoas abscess is unlikely although there is imaging overlap between the appearance is of infection and degenerative disease.  Disc degeneration. Shallow broad-based posterior disc protrusion superimposed on disc bulging. Bilateral L4 laminotomies have been performed and central canal is decompressed. There is bilateral subarticular stenosis. LEFT foraminal stenosis is moderate, associated with degenerative disc and facet disease.  L5-S1: Partial ossification of the disc. Solid posterior lateral fusion and laminectomy with good decompression of the central canal.  IMPRESSION: 1. No convincing findings of spinal infection in this patient with psoas abscess and drainage. Mild inflammatory changes are present at the LEFT L4-L5 facet joint that are more compatible with ordinary degenerative disease than septic arthritis. Although there is imaging overlap between the appearances, septic arthritis is unlikely. 2. Solid L5-S1 fusion and decompression. 3. 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   Electronically Signed   By: Andreas Newport M.D.   On: 08/22/2014 08:25   Ct Abdomen Pelvis W Contrast  08/20/2014   CLINICAL DATA:  Right upper quadrant abdominal pain for 1 week with nausea and vomiting. History of appendectomy, hernia repair and systemic lupus erythematosus. Moderate leukocytosis. Initial encounter.  EXAM: CT ABDOMEN AND PELVIS WITH CONTRAST  TECHNIQUE: Multidetector CT imaging of the abdomen and pelvis was performed using the standard protocol  following bolus administration of intravenous contrast.  CONTRAST:  100 ml Omnipaque 300  COMPARISON:  Ultrasound same date.  FINDINGS: Minimal dependent atelectasis at both lung bases. Otherwise clear. No pleural or pericardial effusion.  Lower chest: Mildly decreased hepatic density suspicious for steatosis. No focal lesions identified.  Hepatobiliary: The liver is normal in density without focal abnormality. No evidence of gallstones, gallbladder wall thickening or biliary dilatation.  Pancreas: Unremarkable. No pancreatic ductal dilatation or surrounding inflammatory changes.  Spleen: Normal in size without  focal abnormality.  Adrenals/Urinary Tract: Both adrenal glands appear normal.The left kidney appears normal. The right kidney demonstrates hydronephrosis and delayed contrast excretion. There is asymmetric perinephric soft tissue stranding on the right. There is a complex fluid collection or centrally necrotic mass adjacent to the ureteropelvic junction, measuring 5.6 x 3.2 cm transverse on image 46. This extends into the right psoas muscle. No urinary tract calculi demonstrated. The distal ureters and bladder appear unremarkable.  Stomach/Bowel: No evidence of bowel wall thickening, distention or surrounding inflammatory change.Mild sigmoid colon diverticulosis without surrounding inflammation.  Vascular/Lymphatic: There are several mildly prominent retroperitoneal lymph nodes, likely reactive. No mesenteric adenopathy. Mild aortoiliac atherosclerosis. The IVC and renal veins appear patent.  Reproductive: The uterus appears unremarkable.  No adnexal mass.  Other: No evidence of abdominal wall mass or hernia.  Musculoskeletal: No acute or significant osseous findings. Mild lumbar spondylosis noted. There are postsurgical changes at the lumbosacral junction with a grade 1 anterolisthesis. Moderate facet hypertrophy noted at L4-5. The paraspinal fat planes are maintained. No evidence of discitis or  osteomyelitis P  IMPRESSION: 1. Suspected abscess within the right retroperitoneum involving the psoas muscle and obstructing the right ureteral pelvic junction. This is an atypical location, and necrotic neoplasm cannot be completely excluded. No other cause for ureteral obstruction identified. 2. Small retroperitoneal lymph nodes are probably reactive. No other suspicious masses identified. 3. No evidence of urinary tract calculus. 4. Urology consultation recommended. Results were discussed by telephone with Dr. Fayrene Fearing.   Electronically Signed   By: Carey Bullocks M.D.   On: 08/20/2014 14:38   Ir Nephrostomy Placement Right  08/21/2014   CLINICAL DATA:  55 year old female with sepsis secondary to right psoas/ perinephric abscess. This abscess collection results in at least partial obstruction of the proximal ureter and right hydronephrosis. Additionally, there is clinical concern for possible pyonephrosis of the obstructed kidney. Therefore, percutaneous nephrostomy tube placement is warranted. This will then be followed by percutaneous drainage of the psoas/perinephric abscess.  EXAM: IR NEPHROSTOMY PLACEMENT RIGHT  Date: 08/21/2014  PROCEDURE: 1. Ultrasound-guided puncture right renal collecting system 2. Placement of a 10 French percutaneous nephrostomy tube using fluoroscopic guidance Interventional Radiologist:  Sterling Big, MD  ANESTHESIA/SEDATION: Moderate (conscious) sedation was used. 2 mg Versed, 100 mcg Fentanyl were administered intravenously. The patient's vital signs were monitored continuously by radiology nursing throughout the procedure.  Sedation Time: 25 minutes  MEDICATIONS: 2 g Ancef administered intravenously  FLUOROSCOPY TIME:  4 minutes 30 seconds  81 mGy  CONTRAST:  73mL OMNIPAQUE IOHEXOL 300 MG/ML  SOLN  TECHNIQUE: Informed consent was obtained from the patient following explanation of the procedure, risks, benefits and alternatives. The patient understands, agrees and consents  for the procedure. All questions were addressed. A time out was performed.  Maximal barrier sterile technique utilized including caps, mask, sterile gowns, sterile gloves, large sterile drape, hand hygiene, and Betadine skin prep.  The right flank was interrogated with ultrasound. A suitable posterior inferior calyx was identified. Local anesthesia was attained by infiltration with 1% lidocaine. A small dermatotomy was made. Using real-time ultrasound guidance, the posterior inferior calyx was punctured with a 21 gauge Accustick needle. Free return of turbid urine from the needle once the stylet was removed confirmed its location within the collecting system. A gentle hand injection of contrast material opacified the selected lower pole calyx and infundibulum.  A night tracks wire was then advanced into the renal pelvis. The Accustick needle was exchanged for the Accustick  sheath. A Bentson wire was then coiled in the renal pelvis and the Accustick sheath removed. The skin tract was dilated to 10 [place] and a [place] all-purpose drainage catheter was then advanced over the wire and formed with the locking loop in the renal pelvis. A final image was obtained after confirming location within the renal pelvis by gentle hand injection of contrast.  The nephrostomy tube was then secured to the skin with 0 Prolene suture and a sterile bandage. The catheter was connected to gravity bag drainage. The patient tolerated the procedure well.  COMPLICATIONS: None  IMPRESSION: Technically successful placement of a 10 French right-sided percutaneous nephrostomy tube.  Signed,  Sterling Big, MD  [facility]  [facility]   Electronically Signed   By: Malachy Moan M.D.   On: 08/21/2014 16:52   Ct Image Guided Drainage By Percutaneous Catheter  08/21/2014   CLINICAL DATA:  54 year old female with a right retroperitoneal abscess and associated hydronephrosis. CT-guided  drain placement is warranted in the setting of sepsis.  EXAM: CT IMAGE GUIDED DRAINAGE BY PERCUTANEOUS CATHETER  Date: 08/21/2014  PROCEDURE: 1. CT-guided drain placement in the right psoas muscle/perinephric space Interventional Radiologist:  Sterling Big, MD  ANESTHESIA/SEDATION: Moderate (conscious) sedation was used. 0.5 mg Versed, 25 mcg Fentanyl were administered intravenously. The patient's vital signs were monitored continuously by radiology nursing throughout the procedure.  Sedation Time: 15 minutes  MEDICATIONS: 2 g Ancef given within 1 hour of skin incision  TECHNIQUE: Informed consent was obtained from the patient following explanation of the procedure, risks, benefits and alternatives. The patient understands, agrees and consents for the procedure. All questions were addressed. A time out was performed.  A planning axial CT scan was performed. The fluid collection within the right psoas musculature was successfully identified. A suitable skin entry site was selected and marked. The region was then sterilely prepped and draped in standard fashion using Betadine skin prep. Local anesthesia was attained by infiltration with 1% lidocaine. A small dermatotomy was made. Under intermittent CT fluoroscopic guidance, an 18 gauge introducer needle was advanced into the fluid collection. An Amplatz wire was then coiled within the fluid collection in the skin tract dilated to 12 [place]. A Cook 12 [place] all-purpose drainage catheter was then advanced over the wire and formed within the fluid collection. Aspiration yields 20 mL of thick, frankly purulent material. A sample was sent for Gram stain and culture.  Axial CT imaging was then performed confirming good positioning of the drainage catheter. The fluid collection has notably decreased in size. The drainage tube was then secured to the skin with 0 Prolene suture and an adhesive fixation device. The tube was connected to JP bulb suction.  COMPLICATIONS:  None  IMPRESSION: Successful placement of a 12 French percutaneous drainage catheter in the right psoas/perinephric fluid collection.  Aspiration yields 20 mL thick, frankly purulent material. Sample was sent for culture.  PLAN: Once drainage has been minimal (less than 20 mL) for several days, recommend repeat CT scan with contrast to ensure resolution of the abscess prior to tube removal.  Signed,  Sterling Big, MD  [facility]  [facility]   Electronically Signed   By: Malachy Moan M.D.   On: 08/21/2014 18:00   US Abdomen Limited Ruq  08/20/2014   CLINICAL DATA:  Sharp RIGHT upper quadrant pain for 1 week, history lupus, rheumatoid arthritis  EXAM: US ABDOMEN LIMITED -  RIGHT UPPER QUADRANT  COMPARISON:  None  FINDINGS: Gallbladder:  Minimal dependent sludge in gallbladder. No shadowing gallstones, gallbladder wall thickening or pericholecystic fluid.  Common bile duct:  Diameter: Normal caliber 3 mm diameter  Liver:  Echogenic, likely fatty infiltration, though this can be seen with cirrhosis and certain infiltrative disorders. No focal hepatic mass or nodularity. Hepatopetal portal venous flow.  No RIGHT upper quadrant free fluid.  IMPRESSION: Probable fatty infiltration of liver as above.  Minimal gallbladder sludge.  Otherwise negative exam.   Electronically Signed   By: Ulyses Southward M.D.   On: 08/20/2014 13:04    Jeoffrey Massed, MD  [facility] Pager:[phone]  If 7PM-7AM, please contact night-coverage [email or web address] Password [email or web address] 08/25/2014, 1:58 PM   LOS: 5 days

## 2014-08-25 NOTE — Op Note (Signed)
INDICATIONS: infective endocarditis  PROCEDURE:   Informed consent was obtained prior to the procedure. The risks, benefits and alternatives for the procedure were discussed and the patient comprehended these risks.  Risks include, but are not limited to, cough, sore throat, vomiting, nausea, somnolence, esophageal and stomach trauma or perforation, bleeding, low blood pressure, aspiration, pneumonia, infection, trauma to the teeth and death.    After a procedural time-out, the oropharynx was anesthetized with 20% benzocaine spray. The patient was given 7 mg versed and 50 mcg fentanyl for moderate sedation.   The transesophageal probe was inserted in the esophagus and stomach without difficulty and multiple views were obtained.  The patient was kept under observation until the patient left the procedure room.  The patient left the procedure room in stable condition.   Agitated microbubble saline contrast was not administered.  COMPLICATIONS:    There were no immediate complications.  FINDINGS:  Normal TEE. No vegetations seen   Time Spent Directly with the Patient:  30 minutes  Deanna Ball 08/25/2014, 12:05 PM

## 2014-08-25 NOTE — Progress Notes (Signed)
PT Cancellation Note  Patient Details Name: Deanna Ball MRN: [id number] DOB: [birth year]   Cancelled Treatment:    Reason Eval/Treat Not Completed: Attempted tx session. Pt reports too fatigued to participate on today. Will check back another day.    Rebeca Alert, MPT Pager: [phone]

## 2014-08-25 NOTE — Progress Notes (Signed)
Afebrile Anxious Going to [facility] for xray today Perc urine clear and high volume JP drain bloody yellow 25 cc 24 hours Send JP for Cr

## 2014-08-25 NOTE — Progress Notes (Signed)
Patient ID: Deanna Ball, female   DOB: 20-Feb-1961, 54 y.o.   MRN: [id number]    Referring Physician(s): MacDiarmid,S  Subjective:  Pt feeling a little better today; just returned from echo lab- nl study(TEE); no acute changes  Allergies: Review of patient's allergies indicates no known allergies.  Medications: Prior to Admission medications   Medication Sig Start Date End Date Taking? Authorizing Provider  ALPRAZolam Prudy Feeler) 1 MG tablet Take 1 mg by mouth 2 (two) times daily.  07/31/14  Yes Historical Provider, MD  HYDROcodone-acetaminophen (NORCO/VICODIN) 5-325 MG per tablet  07/31/14  Yes Historical Provider, MD  methotrexate (RHEUMATREX) 2.5 MG tablet Take 25 mg by mouth 2 (two) times a week. Caution:Chemotherapy. Protect from light.   Yes Historical Provider, MD  naproxen sodium (ANAPROX) 220 MG tablet Take 220 mg by mouth 2 (two) times daily with a meal.   Yes Historical Provider, MD  NEXIUM 40 MG capsule  07/17/14  Yes Historical Provider, MD  predniSONE (DELTASONE) 2.5 MG tablet Take 2.5 mg by mouth 2 (two) times daily with a meal.   Yes Historical Provider, MD  Vitamin D, Ergocalciferol, (DRISDOL) 50000 UNITS CAPS capsule Take 50,000 Units by mouth every 7 (seven) days.   Yes Historical Provider, MD     Vital Signs: BP 136/79 mmHg  Pulse 94  Temp(Src) 98.1 F (36.7 C) (Oral)  Resp 18  Ht 5' 2.5" (1.588 m)  Wt 157 lb 3 oz (71.3 kg)  BMI 28.27 kg/m2  SpO2 95%  Physical Exam awake/alert; rt psoas /PCN drains intact, insertion sites mildly tender, outputs 5/550 cc's respectively   Imaging: Dg Cervical Spine Complete  08/22/2014   CLINICAL DATA:  Rheumatoid arthritis, potential planning for transesophageal echocardiography, history lupus  EXAM: CERVICAL SPINE  4+ VIEWS  COMPARISON:  None  FINDINGS: Straightening of cervical lordosis question muscle spasm.  Prevertebral soft tissues normal to upper normal in thickness.  Vertebral body and disc space heights maintained.  No  acute fracture, subluxation or bone destruction.  Predental space normal.  Osseous neural foramina grossly patent.  Lung apices clear.  Facet alignments normal.  C1-C2 alignment normal.  IMPRESSION: No acute abnormalities.   Electronically Signed   By: Ulyses Southward M.D.   On: 08/22/2014 14:44   Ct Angio Chest Pe W/cm &/or Wo Cm  08/23/2014   CLINICAL DATA:  Chest pain question pulmonary embolism, chest pain began this morning on RIGHT side near breast, shortness of breath, cough, history lupus, rheumatoid arthritis  EXAM: CT ANGIOGRAPHY CHEST WITH CONTRAST  TECHNIQUE: Multidetector CT imaging of the chest was performed using the standard protocol during bolus administration of intravenous contrast. Multiplanar CT image reconstructions and MIPs were obtained to evaluate the vascular anatomy.  CONTRAST:  OMNIPAQUE IOHEXOL 350 MG/ML SOLN  COMPARISON:  None  FINDINGS: Aorta normal caliber without aneurysm or dissection.  Pulmonary arteries adequately opacified and patent.  No evidence of pulmonary embolism.  Visualized portion of liver and spleen normal appearance.  No thoracic adenopathy, though normal size lymph nodes are seen subcarinal and in both axilla.  Tiny BILATERAL pleural effusions with atelectasis in both lower lobes greater on LEFT.  6 mm diameter nodular density RIGHT upper lobe image 28.  6 mm nodular density LEFT lower lobe image 34.  Remaining lungs clear.  No pneumothorax or definite osseous findings.  Review of the MIP images confirms the above findings.  IMPRESSION: Small BILATERAL pleural effusions with subsegmental atelectasis in BILATERAL lower lobes.  No evidence  pulmonary embolism.  BILATERAL 6 mm pulmonary nodules, recommendation below.  If the patient is at high risk for bronchogenic carcinoma, follow-up chest CT at 6-12 months is recommended. If the patient is at low risk for bronchogenic carcinoma, follow-up chest CT at 12 months is recommended. This recommendation follows the  consensus statement: Guidelines for Management of Small Pulmonary Nodules Detected on CT Scans: A Statement from the Fleischner Society as published in Radiology 2005;237:395-400.   Electronically Signed   By: Ulyses Southward M.D.   On: 08/23/2014 16:01   Mr Lumbar Spine W Wo Contrast  08/22/2014   CLINICAL DATA:  Retroperitoneal abscess. 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  EXAM: MRI LUMBAR SPINE WITHOUT AND WITH CONTRAST  TECHNIQUE: Multiplanar and multiecho pulse sequences of the lumbar spine were obtained without and with intravenous contrast.  CONTRAST:  42mL MULTIHANCE GADOBENATE DIMEGLUMINE 529 MG/ML IV SOLN  COMPARISON:  CT 08/20/2014.  FINDINGS: Segmentation: The numbering convention used for this exam termed L5-S1 as the last intervertebral disc space. Numbering was correlated with prior CT.  Alignment: Grade I anterolisthesis of L5 on S1 is a chronic finding and probably associated with old pars defects. Decompression with solid posterior lateral fusion.  Vertebrae: Benign hemangioma present at T12. Degenerative endplate changes are present at L1-L2. L4 inferior endplate edema appears discogenic and shows mild post gadolinium enhancement. This is unlikely to represent discogenic infection given the degenerated disc and vacuum disc seen on prior CT 08/20/2014.  Conus medullaris: Normal termination at L1.  Paraspinal tissues: RIGHT nephrostomy and RIGHT sella is drains noted. No definite interval change compared to prior abdominal CT.  Disc levels:  Disc Signal: Disc degeneration at L4-L5. Partial ossification of the L5-S1 disc associated with posterior lateral fusion and posterior decompression.  L1-L2:  Negative.  L2-L3:  Facet hypertrophy without stenosis.  Normal disc.  L3-L4:  Mild facet degeneration.  No stenosis.  L4-L5: There is distraction of the LEFT facet joint with some fluid in the joint and enhancing granulation tissue or synovitis in the posterior aspect of the joint. The joint  is degenerated and given the distraction, this is most compatible with ordinary degenerative changes and facet osteoarthritis. Infection of the facet joint is considered unlikely. There is little if any periarticular edema around the LEFT L4-L5 facet joint. The RIGHT facet joint appears normal. An isolated septic facet arthritis even in the setting of psoas abscess is unlikely although there is imaging overlap between the appearance is of infection and degenerative disease.  Disc degeneration. Shallow broad-based posterior disc protrusion superimposed on disc bulging. Bilateral L4 laminotomies have been performed and central canal is decompressed. There is bilateral subarticular stenosis. LEFT foraminal stenosis is moderate, associated with degenerative disc and facet disease.  L5-S1: Partial ossification of the disc. Solid posterior lateral fusion and laminectomy with good decompression of the central canal.  IMPRESSION: 1. No convincing findings of spinal infection in this patient with psoas abscess and drainage. Mild inflammatory changes are present at the LEFT L4-L5 facet joint that are more compatible with ordinary degenerative disease than septic arthritis. Although there is imaging overlap between the appearances, septic arthritis is unlikely. 2. Solid L5-S1 fusion and decompression. 3. 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   Electronically Signed   By: Andreas Newport M.D.   On: 08/22/2014 08:25    Labs:  CBC:  Recent Labs  08/21/14 1225 08/22/14 0511 08/23/14 0419 08/24/14 0415  WBC  5.6 7.8 5.0 5.5  HGB 11.7* 11.3* 10.1* 10.6*  HCT 35.5* 33.4* 31.1* 32.0*  PLT 381 387 374 450*    COAGS:  Recent Labs  08/20/14 2005  INR 1.08    BMP:  Recent Labs  08/22/14 0511 08/23/14 0419 08/24/14 0415 08/25/14 0430  NA 137 140 143 142  K 2.9* 3.6 3.6 3.5  CL 101 107 106 105  CO2 25 26 27 28     GLUCOSE 89 120* 95 95  BUN 9 10 7 8   CALCIUM 8.3* 8.4* 8.6* 8.8*  CREATININE 0.59 0.56 0.47 0.52  GFRNONAA >60 >60 >60 >60  GFRAA >60 >60 >60 >60    LIVER FUNCTION TESTS:  Recent Labs  08/20/14 1140 08/21/14 1225  BILITOT 0.8 0.9  AST 96* 40  ALT 56* 35  ALKPHOS 246* 185*  PROT 7.3 6.1*  ALBUMIN 2.9* 2.3*    Assessment and Plan: S/p right PCN,rt psoas abscess drainage 6/1; currently afebrile; WBC /creat nl; nl TEE; rec f/u CT A/P with IV contrast later this week if psoas abscess output remains low   Signed: D. 10/21/14 08/25/2014, 2:08 PM   I spent a total of 15 minutes in face to face in clinical consultation/evaluation, greater than 50% of which was counseling/coordinating care for rt psoas abscess drain /rt nephrostomy

## 2014-08-25 NOTE — Interval H&P Note (Signed)
History and Physical Interval Note:  08/25/2014 11:16 AM  Deanna Ball  has presented today for surgery, with the diagnosis of bacteremia  The various methods of treatment have been discussed with the patient and family. After consideration of risks, benefits and other options for treatment, the patient has consented to  Procedure(s): TRANSESOPHAGEAL ECHOCARDIOGRAM (TEE) (N/A) as a surgical intervention .  The patient's history has been reviewed, patient examined, no change in status, stable for surgery.  I have reviewed the patient's chart and labs.  Questions were answered to the patient's satisfaction.     [name]

## 2014-08-25 NOTE — Progress Notes (Signed)
  Echocardiogram Echocardiogram Transesophageal has been performed.  Deanna Ball 08/25/2014, 12:22 PM

## 2014-08-26 ENCOUNTER — Inpatient Hospital Stay (HOSPITAL_COMMUNITY): Payer: Medicare Other

## 2014-08-26 ENCOUNTER — Encounter (HOSPITAL_COMMUNITY): Payer: Self-pay | Admitting: Cardiovascular Disease

## 2014-08-26 DIAGNOSIS — K6812 Psoas muscle abscess: Secondary | ICD-10-CM

## 2014-08-26 LAB — CREATININE, URINE, RANDOM

## 2014-08-26 MED ORDER — LIDOCAINE HCL 1 % IJ SOLN
INTRAMUSCULAR | Status: AC
  Filled 2014-08-26: qty 20

## 2014-08-26 MED ORDER — HYDROMORPHONE HCL 2 MG/ML IJ SOLN
INTRAMUSCULAR | Status: AC
  Filled 2014-08-26: qty 3

## 2014-08-26 MED ORDER — HYDROMORPHONE HCL 1 MG/ML IJ SOLN
INTRAMUSCULAR | Status: AC | PRN
  Administered 2014-08-26: 2 mg via INTRAVENOUS
  Administered 2014-08-26: 1 mg via INTRAVENOUS

## 2014-08-26 MED ORDER — IOHEXOL 300 MG/ML  SOLN
10.0000 mL | Freq: Once | INTRAMUSCULAR | Status: AC | PRN
  Administered 2014-08-26: 10 mL

## 2014-08-26 NOTE — Progress Notes (Signed)
PATIENT DETAILS Name: Deanna Ball Age: 54 y.o. Sex: female Date of Birth: [birth year] Admit Date: 08/20/2014 Admitting Physician Ozella Rocks, MD PCP:No primary care provider on file.  Brief Narrative: 54 yo female with hx of SLE on chronic prednisone/MTX admitted with worsening right sided flank pain. Found to have right perinephric abscess causing right sided hydronephroses and MSSA bacteremia. TEE negative. Has a percutaneous drain in the right perinephric abscess in place, nephrostomy tube remains in place. IR and urology consulted. Infectious disease recommends IV Ancef for a total of 8 weeks with last day being on 10/15/14. See below for details  Subjective: Right-sided chest pain slowly improving, low back pain and flank pain also improving. Has much more movement in her right lower extremity today compared to the past few days-as pain has improved  Assessment/Plan: Principal Problem: Abscess right retroperitoneum/perinephric abscess involving the psoas muscle and obstructing the right ureteral pelvic junction causing hydronephrosis: Underwent placement of right percutaneous nephrostomy tube and percutaneous drain into the right psoas/perinephric abscess by interventional radiology on 6/1. Abscess culture, blood culture, urine culture positive for MSSA.  Urology will talk to IR about timing of repeat CT abdomen prior to discharge.    Active Problems: Sepsis: Secondary to above and MSSA bacteremia. Sepsis pathophysiology resolved.  Cultures and antibiotics as above.  MSSA bacteremia: Blood culture along with urine and abscess culture positive for MSSA.  TTE and TEE. negative for vegetations. Seen by infectious disease, recommendations are a total of 8 weeks of IV Ancef, with last day being 10/15/14. Patient will need weekly CBC with BMET faxed to Dr Clinton Gallant office at [phone]. Since repeat blood cultures on 6/2 continue to be negative, PICC line placement  ordered.  Right-sided chest pain:Pleuritic-CT Chest negative for PE. Follow.Supportive care- NSAID's-improving.  Anemia: Likely secondary to acute illness. Follow CBCs periodically.  Hypokalemia: Repleted  History of lupus: Continue prednisone, hold methotrexate.  History of alcohol abuse: Drinks 2 bottles of wine daily. No signs of withdrawal currently-more than 7 days out from her last drink.   GERD: PPI.  Anxiety: Continue as needed Xanax.  Disposition: Remain inpatient-suspect SNF on discharge-once a repeat CT scan of the abdomen is performed.  Antimicrobial agents  See below  Anti-infectives    Start     Dose/Rate Route Frequency Ordered Stop   08/22/14 1400  ceFAZolin (ANCEF) IVPB 2 g/50 mL premix     2 g 100 mL/hr over 30 Minutes Intravenous 3 times per day 08/22/14 1209     08/22/14 0200  vancomycin (VANCOCIN) IVPB 1000 mg/200 mL premix  Status:  Discontinued     1,000 mg 200 mL/hr over 60 Minutes Intravenous Every 12 hours 08/21/14 1213 08/23/14 0942   08/21/14 1500  cefTRIAXone (ROCEPHIN) 1 g in dextrose 5 % 50 mL IVPB  Status:  Discontinued     1 g 100 mL/hr over 30 Minutes Intravenous  Once 08/20/14 1702 08/21/14 1206   08/21/14 1300  vancomycin (VANCOCIN) 1,500 mg in sodium chloride 0.9 % 500 mL IVPB     1,500 mg 250 mL/hr over 120 Minutes Intravenous  Once 08/21/14 1213 08/21/14 1700   08/21/14 1230  Ampicillin-Sulbactam (UNASYN) 3 g in sodium chloride 0.9 % 100 mL IVPB  Status:  Discontinued     3 g 100 mL/hr over 60 Minutes Intravenous 4 times per day 08/21/14 1208 08/22/14 1209   08/20/14 2033  ceFAZolin (  ANCEF) 2-3 GM-% IVPB SOLR    Comments:  Kevan Ny   : cabinet override      08/20/14 2033 08/20/14 2042   08/20/14 1515  cefTRIAXone (ROCEPHIN) 1 g in dextrose 5 % 50 mL IVPB     1 g 100 mL/hr over 30 Minutes Intravenous  Once 08/20/14 1504 08/20/14 1613      DVT Prophylaxis: Prophylactic  Heparin   Code Status: Full code   Family  Communication Mother at bedside  Procedures: Perc Nephrostomy/Abscess drain 6/1  CONSULTS:  ID and urology   IR  Time spent 25 minutes-Greater than 50% of this time was spent in counseling, explanation of diagnosis, planning of further management, and coordination of care.  MEDICATIONS: Scheduled Meds: . acetaminophen  1,000 mg Oral TID  . ALPRAZolam  1 mg Oral BID  .  ceFAZolin (ANCEF) IV  2 g Intravenous 3 times per day  . folic acid  1 mg Oral Daily  . heparin  5,000 Units Subcutaneous 3 times per day  . naproxen  375 mg Oral BID WC  . pantoprazole  40 mg Oral Daily  . polyethylene glycol  17 g Oral Daily  . predniSONE  2.5 mg Oral BID WC  . senna  2 tablet Oral QHS  . sodium chloride  1,000 mL Intravenous Once  . sodium chloride  3 mL Intravenous Q12H  . thiamine  100 mg Oral Daily   Continuous Infusions: . sodium chloride 10 mL/hr at 08/25/14 2253   PRN Meds:.morphine injection, ondansetron **OR** ondansetron (ZOFRAN) IV, oxyCODONE    PHYSICAL EXAM: Vital signs in last 24 hours: Filed Vitals:   08/25/14 1355 08/25/14 2121 08/26/14 0451 08/26/14 1416  BP: 136/79 130/89 135/94 114/80  Pulse: 94 66 73 72  Temp: 98.1 F (36.7 C) 98.1 F (36.7 C) 97.6 F (36.4 C) 97.7 F (36.5 C)  TempSrc: Oral Oral Oral Oral  Resp: 18 18 18 20   Height:      Weight:      SpO2: 95% 99% 96% 98%    Weight change:  Filed Weights   08/20/14 1030 08/20/14 1824  Weight: 70.308 kg (155 lb) 71.3 kg (157 lb 3 oz)   Body mass index is 28.27 kg/(m^2).   Gen Exam: Awake and alert with clear speech.  Neck: Supple, No JVD.  Chest: B/L Clear.  No rales/rhochi CVS: S1 S2 Regular Abdomen: soft, BS +, non tender, non distended.  Extremities: no edema, lower extremities warm to touch Neurologic: Non Focal-sensation intact Wounds: N/A.    Intake/Output from previous day:  Intake/Output Summary (Last 24 hours) at 08/26/14 1511 Last data filed at 08/26/14 1318  Gross per 24 hour    Intake    810 ml  Output   1555 ml  Net   -745 ml     LAB RESULTS: CBC  Recent Labs Lab 08/20/14 1140 08/21/14 1225 08/22/14 0511 08/23/14 0419 08/24/14 0415  WBC 16.3* 5.6 7.8 5.0 5.5  HGB 12.8 11.7* 11.3* 10.1* 10.6*  HCT 38.0 35.5* 33.4* 31.1* 32.0*  PLT 491* 381 387 374 450*  MCV 103.3* 102.9* 100.3* 103.0* 102.9*  MCH 34.8* 33.9 33.9 33.4 34.1*  MCHC 33.7 33.0 33.8 32.5 33.1  RDW 13.1 13.1 13.0 13.3 13.4  LYMPHSABS 1.3  --   --   --   --   MONOABS 1.0  --   --   --   --   EOSABS 0.0  --   --   --   --  BASOSABS 0.0  --   --   --   --     Chemistries   Recent Labs Lab 08/21/14 1225 08/22/14 0511 08/23/14 0419 08/24/14 0415 08/25/14 0430  NA 138 137 140 143 142  K 3.1* 2.9* 3.6 3.6 3.5  CL 104 101 107 106 105  CO2 23 25 26 27 28   GLUCOSE 93 89 120* 95 95  BUN 11 9 10 7 8   CREATININE 0.65 0.59 0.56 0.47 0.52  CALCIUM 8.2* 8.3* 8.4* 8.6* 8.8*  MG  --   --  1.9  --   --     CBG: No results for input(s): GLUCAP in the last 168 hours.  GFR Estimated Creatinine Clearance: 73.5 mL/min (by C-G formula based on Cr of 0.52).  Coagulation profile  Recent Labs Lab 08/20/14 2005  INR 1.08    Cardiac Enzymes  Recent Labs Lab 08/22/14 0952  TROPONINI <0.03    Invalid input(s): POCBNP No results for input(s): DDIMER in the last 72 hours. No results for input(s): HGBA1C in the last 72 hours. No results for input(s): CHOL, HDL, LDLCALC, TRIG, CHOLHDL, LDLDIRECT in the last 72 hours. No results for input(s): TSH, T4TOTAL, T3FREE, THYROIDAB in the last 72 hours.  Invalid input(s): FREET3 No results for input(s): VITAMINB12, FOLATE, FERRITIN, TIBC, IRON, RETICCTPCT in the last 72 hours. No results for input(s): LIPASE, AMYLASE in the last 72 hours.  Urine Studies No results for input(s): UHGB, CRYS in the last 72 hours.  Invalid input(s): UACOL, UAPR, USPG, UPH, UTP, UGL, UKET, UBIL, UNIT, UROB, ULEU, UEPI, UWBC, URBC, UBAC, CAST, UCOM,  BILUA  MICROBIOLOGY: Recent Results (from the past 240 hour(s))  Urine culture     Status: None   Collection Time: 08/20/14  1:15 PM  Result Value Ref Range Status   Specimen Description URINE, CLEAN CATCH  Final   Special Requests NONE  Final   Colony Count   Final    >=100,000 COLONIES/ML Performed at [facility]    Culture   Final    STAPHYLOCOCCUS AUREUS Note: RIFAMPIN AND GENTAMICIN SHOULD NOT BE USED AS SINGLE DRUGS FOR TREATMENT OF STAPH INFECTIONS. Performed at [facility]    Report Status 08/22/2014 FINAL  Final   Organism ID, Bacteria STAPHYLOCOCCUS AUREUS  Final      Susceptibility   Staphylococcus aureus - MIC*    GENTAMICIN <=0.5 SENSITIVE Sensitive     LEVOFLOXACIN >=8 RESISTANT Resistant     NITROFURANTOIN <=16 SENSITIVE Sensitive     OXACILLIN 0.5 SENSITIVE Sensitive     PENICILLIN RESISTANT      RIFAMPIN <=0.5 SENSITIVE Sensitive     TRIMETH/SULFA <=10 SENSITIVE Sensitive     VANCOMYCIN 1 SENSITIVE Sensitive     TETRACYCLINE <=1 SENSITIVE Sensitive     * STAPHYLOCOCCUS AUREUS  Culture, blood (routine x 2)     Status: None   Collection Time: 08/20/14  3:24 PM  Result Value Ref Range Status   Specimen Description BLOOD RIGHT ANTECUBITAL  Final   Special Requests BOTTLES DRAWN AEROBIC AND ANAEROBIC 8CC  Final   Culture   Final    STAPHYLOCOCCUS AUREUS Note: RIFAMPIN AND GENTAMICIN SHOULD NOT BE USED AS SINGLE DRUGS FOR TREATMENT OF STAPH INFECTIONS. SUSCEPTIBILITIES PERFORMED ON PREVIOUS CULTURE WITHIN THE LAST 5 DAYS. Note: Gram Stain Report Called to,Read Back By and Verified With:  ARMSTRONG S @ 0715 08/21/2014 BY RESSEGGER RESISTANT Performed at [facility]    Report Status 08/23/2014 FINAL  Final  Culture, blood (routine x 2)     Status: None   Collection Time: 08/20/14  3:33 PM  Result Value Ref Range Status   Specimen Description BLOOD RIGHT HAND  Final   Special Requests BOTTLES DRAWN AEROBIC ONLY 4CC  Final   Culture    Final    STAPHYLOCOCCUS AUREUS Note: RIFAMPIN AND GENTAMICIN SHOULD NOT BE USED AS SINGLE DRUGS FOR TREATMENT OF STAPH INFECTIONS. Note: Gram Stain Report Called to,Read Back By and Verified With: ARMSTRONG S [facility]) AT 0715 ON 08/21/2014 BY RESSEGGER R Performed at [facility]    Report Status 08/23/2014 FINAL  Final   Organism ID, Bacteria STAPHYLOCOCCUS AUREUS  Final      Susceptibility   Staphylococcus aureus - MIC*    CLINDAMYCIN <=0.25 SENSITIVE Sensitive     ERYTHROMYCIN <=0.25 SENSITIVE Sensitive     GENTAMICIN <=0.5 SENSITIVE Sensitive     LEVOFLOXACIN >=8 RESISTANT Resistant     OXACILLIN 0.5 SENSITIVE Sensitive     PENICILLIN RESISTANT      RIFAMPIN <=0.5 SENSITIVE Sensitive     TRIMETH/SULFA <=10 SENSITIVE Sensitive     VANCOMYCIN <=0.5 SENSITIVE Sensitive     TETRACYCLINE <=1 SENSITIVE Sensitive     MOXIFLOXACIN 4 RESISTANT Resistant     * STAPHYLOCOCCUS AUREUS  MRSA PCR Screening     Status: Abnormal   Collection Time: 08/20/14  7:48 PM  Result Value Ref Range Status   MRSA by PCR POSITIVE (A) NEGATIVE Final    Comment:        The GeneXpert MRSA Assay (FDA approved for NASAL specimens only), is one component of a comprehensive MRSA colonization surveillance program. It is not intended to diagnose MRSA infection nor to guide or monitor treatment for MRSA infections. RESULT CALLED TO, READ BACK BY AND VERIFIED WITH: ARMSTRONG,S RN [phone] [place],N RESULT CALLED TO, READ BACK BY AND VERIFIED WITH: ARMSTRONG,S RN [phone] [place],N RESULT CALLED TO, READ BACK BY AND VERIFIED WITH: ARSTRONG,S RN [phone] [place],N   Culture, routine-abscess     Status: None   Collection Time: 08/20/14 10:16 PM  Result Value Ref Range Status   Specimen Description ABSCESS  Final   Special Requests NONE  Final   Gram Stain   Final    MODERATE WBC PRESENT,BOTH PMN AND MONONUCLEAR NO SQUAMOUS EPITHELIAL CELLS SEEN MODERATE GRAM POSITIVE COCCI IN PAIRS  IN CLUSTERS Performed at [facility]    Culture   Final    ABUNDANT STAPHYLOCOCCUS AUREUS Note: RIFAMPIN AND GENTAMICIN SHOULD NOT BE USED AS SINGLE DRUGS FOR TREATMENT OF STAPH INFECTIONS. Performed at [facility]    Report Status 08/23/2014 FINAL  Final   Organism ID, Bacteria STAPHYLOCOCCUS AUREUS  Final      Susceptibility   Staphylococcus aureus - MIC*    CLINDAMYCIN <=0.25 SENSITIVE Sensitive     ERYTHROMYCIN <=0.25 SENSITIVE Sensitive     GENTAMICIN <=0.5 SENSITIVE Sensitive     LEVOFLOXACIN >=8 RESISTANT Resistant     OXACILLIN 0.5 SENSITIVE Sensitive     PENICILLIN 0.12 SENSITIVE Sensitive     RIFAMPIN <=0.5 SENSITIVE Sensitive     TRIMETH/SULFA <=10 SENSITIVE Sensitive     VANCOMYCIN 1 SENSITIVE Sensitive     TETRACYCLINE <=1 SENSITIVE Sensitive     MOXIFLOXACIN 4 RESISTANT Resistant     * ABUNDANT STAPHYLOCOCCUS AUREUS  Anaerobic culture     Status: None   Collection Time: 08/20/14 10:16 PM  Result Value Ref Range Status  Specimen Description ABSCESS  Final   Special Requests NONE  Final   Gram Stain   Final    MODERATE WBC PRESENT,BOTH PMN AND MONONUCLEAR NO SQUAMOUS EPITHELIAL CELLS SEEN MODERATE GRAM POSITIVE COCCI IN PAIRS IN CLUSTERS Performed at [facility]    Culture   Final    NO ANAEROBES ISOLATED Performed at [facility]    Report Status 08/25/2014 FINAL  Final  Blood Cultures x 2 sites     Status: None (Preliminary result)   Collection Time: 08/21/14  4:50 PM  Result Value Ref Range Status   Specimen Description BLOOD RIGHT HAND  Final   Special Requests BOTTLES DRAWN AEROBIC ONLY 10CC  Final   Culture   Final           BLOOD CULTURE RECEIVED NO GROWTH TO DATE CULTURE WILL BE HELD FOR 5 DAYS BEFORE ISSUING A FINAL NEGATIVE REPORT Performed at [facility]    Report Status PENDING  Incomplete  Blood Cultures x 2 sites     Status: None (Preliminary result)   Collection Time: 08/21/14  5:05 PM  Result  Value Ref Range Status   Specimen Description BLOOD RIGHT ARM  Final   Special Requests BOTTLES DRAWN AEROBIC AND ANAEROBIC 10CC  Final   Culture   Final           BLOOD CULTURE RECEIVED NO GROWTH TO DATE CULTURE WILL BE HELD FOR 5 DAYS BEFORE ISSUING A FINAL NEGATIVE REPORT Performed at [facility]    Report Status PENDING  Incomplete    RADIOLOGY STUDIES/RESULTS: Dg Cervical Spine Complete  08/22/2014   CLINICAL DATA:  Rheumatoid arthritis, potential planning for transesophageal echocardiography, history lupus  EXAM: CERVICAL SPINE  4+ VIEWS  COMPARISON:  None  FINDINGS: Straightening of cervical lordosis question muscle spasm.  Prevertebral soft tissues normal to upper normal in thickness.  Vertebral body and disc space heights maintained.  No acute fracture, subluxation or bone destruction.  Predental space normal.  Osseous neural foramina grossly patent.  Lung apices clear.  Facet alignments normal.  C1-C2 alignment normal.  IMPRESSION: No acute abnormalities.   Electronically Signed   By: Ulyses Southward M.D.   On: 08/22/2014 14:44   Ct Angio Chest Pe W/cm &/or Wo Cm  08/23/2014   CLINICAL DATA:  Chest pain question pulmonary embolism, chest pain began this morning on RIGHT side near breast, shortness of breath, cough, history lupus, rheumatoid arthritis  EXAM: CT ANGIOGRAPHY CHEST WITH CONTRAST  TECHNIQUE: Multidetector CT imaging of the chest was performed using the standard protocol during bolus administration of intravenous contrast. Multiplanar CT image reconstructions and MIPs were obtained to evaluate the vascular anatomy.  CONTRAST:  OMNIPAQUE IOHEXOL 350 MG/ML SOLN  COMPARISON:  None  FINDINGS: Aorta normal caliber without aneurysm or dissection.  Pulmonary arteries adequately opacified and patent.  No evidence of pulmonary embolism.  Visualized portion of liver and spleen normal appearance.  No thoracic adenopathy, though normal size lymph nodes are seen subcarinal and in both  axilla.  Tiny BILATERAL pleural effusions with atelectasis in both lower lobes greater on LEFT.  6 mm diameter nodular density RIGHT upper lobe image 28.  6 mm nodular density LEFT lower lobe image 34.  Remaining lungs clear.  No pneumothorax or definite osseous findings.  Review of the MIP images confirms the above findings.  IMPRESSION: Small BILATERAL pleural effusions with subsegmental atelectasis in BILATERAL lower lobes.  No evidence pulmonary embolism.  BILATERAL  6 mm pulmonary nodules, recommendation below.  If the patient is at high risk for bronchogenic carcinoma, follow-up chest CT at 6-12 months is recommended. If the patient is at low risk for bronchogenic carcinoma, follow-up chest CT at 12 months is recommended. This recommendation follows the consensus statement: Guidelines for Management of Small Pulmonary Nodules Detected on CT Scans: A Statement from the Fleischner Society as published in Radiology 2005;237:395-400.   Electronically Signed   By: Ulyses Southward M.D.   On: 08/23/2014 16:01   Mr Lumbar Spine W Wo Contrast  08/22/2014   CLINICAL DATA:  Retroperitoneal abscess. 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  EXAM: MRI LUMBAR SPINE WITHOUT AND WITH CONTRAST  TECHNIQUE: Multiplanar and multiecho pulse sequences of the lumbar spine were obtained without and with intravenous contrast.  CONTRAST:  36mL MULTIHANCE GADOBENATE DIMEGLUMINE 529 MG/ML IV SOLN  COMPARISON:  CT 08/20/2014.  FINDINGS: Segmentation: The numbering convention used for this exam termed L5-S1 as the last intervertebral disc space. Numbering was correlated with prior CT.  Alignment: Grade I anterolisthesis of L5 on S1 is a chronic finding and probably associated with old pars defects. Decompression with solid posterior lateral fusion.  Vertebrae: Benign hemangioma present at T12. Degenerative endplate changes are present at L1-L2. L4 inferior endplate edema appears discogenic and shows mild post gadolinium  enhancement. This is unlikely to represent discogenic infection given the degenerated disc and vacuum disc seen on prior CT 08/20/2014.  Conus medullaris: Normal termination at L1.  Paraspinal tissues: RIGHT nephrostomy and RIGHT sella is drains noted. No definite interval change compared to prior abdominal CT.  Disc levels:  Disc Signal: Disc degeneration at L4-L5. Partial ossification of the L5-S1 disc associated with posterior lateral fusion and posterior decompression.  L1-L2:  Negative.  L2-L3:  Facet hypertrophy without stenosis.  Normal disc.  L3-L4:  Mild facet degeneration.  No stenosis.  L4-L5: There is distraction of the LEFT facet joint with some fluid in the joint and enhancing granulation tissue or synovitis in the posterior aspect of the joint. The joint is degenerated and given the distraction, this is most compatible with ordinary degenerative changes and facet osteoarthritis. Infection of the facet joint is considered unlikely. There is little if any periarticular edema around the LEFT L4-L5 facet joint. The RIGHT facet joint appears normal. An isolated septic facet arthritis even in the setting of psoas abscess is unlikely although there is imaging overlap between the appearance is of infection and degenerative disease.  Disc degeneration. Shallow broad-based posterior disc protrusion superimposed on disc bulging. Bilateral L4 laminotomies have been performed and central canal is decompressed. There is bilateral subarticular stenosis. LEFT foraminal stenosis is moderate, associated with degenerative disc and facet disease.  L5-S1: Partial ossification of the disc. Solid posterior lateral fusion and laminectomy with good decompression of the central canal.  IMPRESSION: 1. No convincing findings of spinal infection in this patient with psoas abscess and drainage. Mild inflammatory changes are present at the LEFT L4-L5 facet joint that are more compatible with ordinary degenerative disease than  septic arthritis. Although there is imaging overlap between the appearances, septic arthritis is unlikely. 2. Solid L5-S1 fusion and decompression. 3. 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   Electronically Signed   By: Andreas Newport M.D.   On: 08/22/2014 08:25   Ct Abdomen Pelvis W Contrast  08/20/2014   CLINICAL DATA:  Right upper quadrant abdominal pain for 1 week with nausea  and vomiting. History of appendectomy, hernia repair and systemic lupus erythematosus. Moderate leukocytosis. Initial encounter.  EXAM: CT ABDOMEN AND PELVIS WITH CONTRAST  TECHNIQUE: Multidetector CT imaging of the abdomen and pelvis was performed using the standard protocol following bolus administration of intravenous contrast.  CONTRAST:  100 ml Omnipaque 300  COMPARISON:  Ultrasound same date.  FINDINGS: Minimal dependent atelectasis at both lung bases. Otherwise clear. No pleural or pericardial effusion.  Lower chest: Mildly decreased hepatic density suspicious for steatosis. No focal lesions identified.  Hepatobiliary: The liver is normal in density without focal abnormality. No evidence of gallstones, gallbladder wall thickening or biliary dilatation.  Pancreas: Unremarkable. No pancreatic ductal dilatation or surrounding inflammatory changes.  Spleen: Normal in size without focal abnormality.  Adrenals/Urinary Tract: Both adrenal glands appear normal.The left kidney appears normal. The right kidney demonstrates hydronephrosis and delayed contrast excretion. There is asymmetric perinephric soft tissue stranding on the right. There is a complex fluid collection or centrally necrotic mass adjacent to the ureteropelvic junction, measuring 5.6 x 3.2 cm transverse on image 46. This extends into the right psoas muscle. No urinary tract calculi demonstrated. The distal ureters and bladder appear unremarkable.  Stomach/Bowel: No evidence of bowel  wall thickening, distention or surrounding inflammatory change.Mild sigmoid colon diverticulosis without surrounding inflammation.  Vascular/Lymphatic: There are several mildly prominent retroperitoneal lymph nodes, likely reactive. No mesenteric adenopathy. Mild aortoiliac atherosclerosis. The IVC and renal veins appear patent.  Reproductive: The uterus appears unremarkable.  No adnexal mass.  Other: No evidence of abdominal wall mass or hernia.  Musculoskeletal: No acute or significant osseous findings. Mild lumbar spondylosis noted. There are postsurgical changes at the lumbosacral junction with a grade 1 anterolisthesis. Moderate facet hypertrophy noted at L4-5. The paraspinal fat planes are maintained. No evidence of discitis or osteomyelitis P  IMPRESSION: 1. Suspected abscess within the right retroperitoneum involving the psoas muscle and obstructing the right ureteral pelvic junction. This is an atypical location, and necrotic neoplasm cannot be completely excluded. No other cause for ureteral obstruction identified. 2. Small retroperitoneal lymph nodes are probably reactive. No other suspicious masses identified. 3. No evidence of urinary tract calculus. 4. Urology consultation recommended. Results were discussed by telephone with Dr. James.   Electronically Signed   By: William  Veazey M.D.   On: 08/20/2014 14:38   Ir Nephrostomy Placement Right  08/21/2014   CLINICAL DATA:  54 year old female with sepsis secondary to right psoas/ perinephric abscess. This abscess collection results in at least partial obstruction of the proximal ureter and right hydronephrosis. Additionally, there is clinical concern for possible pyonephrosis of the obstructed kidney. Therefore, percutaneous nephrostomy tube placement is warranted. This will then be followed by percutaneous drainage of the psoas/perinephric abscess.  EXAM: IR NEPHROSTOMY PLACEMENT RIGHT  Date: 08/21/2014  PROCEDURE: 1. Ultrasound-guided puncture right  renal collecting system 2. Placement of a 10 French percutaneous nephrostomy tube using fluoroscopic guidance Interventional Radiologist:  Heath K. McCullough, MD  ANESTHESIA/SEDATION: Moderate (conscious) sedation was useCove.Etienned. 2 mg Versed, 100 mcg Fentanyl were administered intravenously. The [email or web address] lowing explanation of the procedure, risks, benefits and alternatives. The patient understands, agrees and consents for the procedure. All questions were addressed. A  time out was performed.  Maximal barrier sterile technique utilized including caps, mask, sterile gowns, sterile gloves, large sterile drape, hand hygiene, and Betadine skin prep.  The right flank was interrogated with ultrasound. A suitable posterior inferior calyx was identified. Local anesthesia was attained by infiltration with 1% lidocaine. A small dermatotomy was made. Using real-time ultrasound guidance, the posterior inferior calyx was punctured with a 21 gauge Accustick needle. Free return of turbid urine from the needle once the stylet was removed confirmed its location within the collecting system. A gentle hand injection of contrast material opacified the selected lower pole calyx and infundibulum.  A night tracks wire was then advanced into the renal pelvis. The Accustick needle was exchanged for the Accustick sheath. A Bentson wire was then coiled in the renal pelvis and the Accustick sheath removed. The skin tract was dilated to 10 [place] and a [place] all-purpose drainage catheter was then advanced over the wire and formed with the locking loop in the renal pelvis. A final image was obtained after confirming  location within the renal pelvis by gentle hand injection of contrast.  The nephrostomy tube was then secured to the skin with 0 Prolene suture and a sterile bandage. The catheter was connected to gravity bag drainage. The patient tolerated the procedure well.  COMPLICATIONS: None  IMPRESSION: Technically successful placement of a 10 French right-sided percutaneous nephrostomy tube.  Signed,  Sterling Big, MD  [facility]  [facility]   Electronically Signed   By: Malachy Moan M.D.   On: 08/21/2014 16:52   Ct Image Guided Drainage By Percutaneous Catheter  08/21/2014   CLINICAL DATA:  54 year old female with a right retroperitoneal abscess and associated hydronephrosis. CT-guided drain placement is warranted in the setting of sepsis.  EXAM: CT IMAGE GUIDED DRAINAGE BY PERCUTANEOUS CATHETER  Date: 08/21/2014  PROCEDURE: 1. CT-guided drain placement in the right psoas muscle/perinephric space Interventional Radiologist:  Sterling Big, MD  ANESTHESIA/SEDATION: Moderate (conscious) sedation was used. 0.5 mg Versed, 25 mcg Fentanyl were administered intravenously. The patient's vital signs were monitored continuously by radiology nursing throughout the procedure.  Sedation Time: 15 minutes  MEDICATIONS: 2 g Ancef given within 1 hour of skin incision  TECHNIQUE: Informed consent was obtained from the patient following explanation of the procedure, risks, benefits and alternatives. The patient understands, agrees and consents for the procedure. All questions were addressed. A time out was performed.  A planning axial CT scan was performed. The fluid collection within the right psoas musculature was successfully identified. A suitable skin entry site was selected and marked. The region was then sterilely prepped and draped in standard fashion using Betadine skin prep. Local anesthesia was attained by infiltration with 1% lidocaine. A small dermatotomy was  made. Under intermittent CT fluoroscopic guidance, an 18 gauge introducer needle was advanced into the fluid collection. An Amplatz wire was then coiled within the fluid collection in the skin tract dilated to 12 [place]. A Cook 12 [place] all-purpose drainage catheter was then advanced over the wire and formed within the fluid collection. Aspiration yields 20 mL of thick, frankly purulent material. A sample was sent for Gram stain and culture.  Axial CT imaging was then performed confirming good positioning of the drainage catheter. The fluid collection has notably decreased in size. The drainage tube was then secured to the skin with 0 Prolene suture and an adhesive fixation device. The tube was connected to JP bulb suction.  COMPLICATIONS: None  IMPRESSION: Successful placement of a 12 French percutaneous drainage catheter in the right psoas/perinephric fluid collection.  Aspiration yields 20 mL thick, frankly purulent material. Sample was sent for culture.  PLAN: Once drainage has been minimal (less than 20 mL) for several days, recommend repeat CT scan with contrast to ensure resolution of the abscess prior to tube removal.  Signed,  Sterling Big, MD  [facility]  [facility]   Electronically Signed   By: Malachy Moan M.D.   On: 08/21/2014 18:00   US Abdomen Limited Ruq  08/20/2014   CLINICAL DATA:  Sharp RIGHT upper quadrant pain for 1 week, history lupus, rheumatoid arthritis  EXAM: US ABDOMEN LIMITED - RIGHT UPPER QUADRANT  COMPARISON:  None  FINDINGS: Gallbladder:  Minimal dependent sludge in gallbladder. No shadowing gallstones, gallbladder wall thickening or pericholecystic fluid.  Common bile duct:  Diameter: Normal caliber 3 mm diameter  Liver:  Echogenic, likely fatty infiltration, though this can be seen with cirrhosis and certain infiltrative disorders. No focal hepatic mass or nodularity. Hepatopetal portal venous flow.  No RIGHT upper  quadrant free fluid.  IMPRESSION: Probable fatty infiltration of liver as above.  Minimal gallbladder sludge.  Otherwise negative exam.   Electronically Signed   By: Ulyses Southward M.D.   On: 08/20/2014 13:04    Jeoffrey Massed, MD  [facility] Pager:[phone]  If 7PM-7AM, please contact night-coverage [email or web address] Password [email or web address] 08/26/2014, 3:11 PM   LOS: 6 days

## 2014-08-26 NOTE — Procedures (Signed)
antegrade nephrostogram thru RT PCN shows patent ureter to the bladder Cap neph tube and if tolerated can remove tomorrow D/w Urology Full report in pacs

## 2014-08-26 NOTE — Progress Notes (Signed)
Staff from IR called informing me that pt verbalized hurting herself. Upon arrival patients arrival to her room, this RN ask  if she still has plans of hurting herself, patient denied stated "I was just upset that's why I said it earlier but I don't have plans of hurting myself". CN made aware.

## 2014-08-26 NOTE — Progress Notes (Signed)
Send home when OK with primary care and ID I gave pt my number to see as outpt We need to speak to IR for their follow up I think they need to see her soon and re-evaluate the abscess with CT and likely pull the drain I will speak to IR today

## 2014-08-26 NOTE — Clinical Social Work Note (Signed)
Clinical Social Work Assessment  Patient Details  Name: Deanna Ball MRN: [id number] Date of Birth: 1960/12/13  Date of referral:  08/26/14               Reason for consult:  Facility Placement                Permission sought to share information with:  Facility Industrial/product designer granted to share information::  Yes, Verbal Permission Granted  Name::        Agency::     Relationship::     Contact Information:     Housing/Transportation Living arrangements for the past 2 months:  Mobile Home Source of Information:  Patient, Parent Patient Interpreter Needed:  None Criminal Activity/Legal Involvement Pertinent to Current Situation/Hospitalization:  No - Comment as needed Significant Relationships:  Parents Lives with:  Self Do you feel safe going back to the place where you live?  No Need for family participation in patient care:  No (Coment)  Care giving concerns:  CSW reviewed PT evaluation recommending SNF at discharge.    Social Worker assessment / plan:  CSW confirmed with patient that she is agreeable with plan for SNF.   Employment status:  Disabled (Comment on whether or not currently receiving Disability) Insurance information:  Managed Medicare PT Recommendations:  Skilled Nursing Facility Information / Referral to community resources:  Skilled Nursing Facility  Patient/Family's Response to care:  Patient states that she's not happy about going to a SNF, but understands the need for her to go - she lives alone and states that she wouldn't be able to take care of herself in her current condition.   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 Patient accepted bed at [facility] as it is closest to her home in [place] - patient confirmed with her mother, Deanna Ball that [name] would be the best/closest SNF for them.   Emotional Assessment Appearance:  Appears older than stated age Attitude/Demeanor/Rapport:    Affect (typically observed):  Hopeful, Pleasant Orientation:  Oriented to Self, Oriented to Place, Oriented to  Time, Oriented to Situation Alcohol / Substance use:    Psych involvement (Current and /or in the community):     Discharge Needs  Concerns to be addressed:    Readmission within the last 30 days:    Current discharge risk:    Barriers to Discharge:      [name], LCSW 08/26/2014, 9:42 AM

## 2014-08-26 NOTE — Progress Notes (Signed)
Referring Physician(s): Urology  Subjective: Patient denies any pain at drain sites.   Allergies: Review of patient's allergies indicates no known allergies.  Medications: Prior to Admission medications   Medication Sig Start Date End Date Taking? Authorizing Provider  ALPRAZolam Prudy Feeler) 1 MG tablet Take 1 mg by mouth 2 (two) times daily.  07/31/14  Yes Historical Provider, MD  HYDROcodone-acetaminophen (NORCO/VICODIN) 5-325 MG per tablet  07/31/14  Yes Historical Provider, MD  methotrexate (RHEUMATREX) 2.5 MG tablet Take 25 mg by mouth 2 (two) times a week. Caution:Chemotherapy. Protect from light.   Yes Historical Provider, MD  naproxen sodium (ANAPROX) 220 MG tablet Take 220 mg by mouth 2 (two) times daily with a meal.   Yes Historical Provider, MD  NEXIUM 40 MG capsule  07/17/14  Yes Historical Provider, MD  predniSONE (DELTASONE) 2.5 MG tablet Take 2.5 mg by mouth 2 (two) times daily with a meal.   Yes Historical Provider, MD  Vitamin D, Ergocalciferol, (DRISDOL) 50000 UNITS CAPS capsule Take 50,000 Units by mouth every 7 (seven) days.   Yes Historical Provider, MD     Vital Signs: BP 114/80 mmHg  Pulse 72  Temp(Src) 97.7 F (36.5 C) (Oral)  Resp 20  Ht 5' 2.5" (1.588 m)  Wt 157 lb 3 oz (71.3 kg)  BMI 28.27 kg/m2  SpO2 98%  Physical Exam  Constitutional: She is oriented to person, place, and time. No distress.  HENT:  Head: Normocephalic and atraumatic.  Cardiovascular: Normal rate and regular rhythm.  Exam reveals no gallop and no friction rub.   No murmur heard. Pulmonary/Chest: Effort normal and breath sounds normal. No respiratory distress. She has no wheezes. She has no rales.  Abdominal: Soft. Bowel sounds are normal. She exhibits no distension. There is no tenderness.  Right lateral perc drain intact 10-22ml/24 hrs, right PCN intact yellow urine output 11ml/24 hrs  Neurological: She is alert and oriented to person, place, and time.  Skin: She is not  diaphoretic.    Imaging: Ct Angio Chest Pe W/cm &/or Wo Cm  08/23/2014   CLINICAL DATA:  Chest pain question pulmonary embolism, chest pain began this morning on RIGHT side near breast, shortness of breath, cough, history lupus, rheumatoid arthritis  EXAM: CT ANGIOGRAPHY CHEST WITH CONTRAST  TECHNIQUE: Multidetector CT imaging of the chest was performed using the standard protocol during bolus administration of intravenous contrast. Multiplanar CT image reconstructions and MIPs were obtained to evaluate the vascular anatomy.  CONTRAST:  OMNIPAQUE IOHEXOL 350 MG/ML SOLN  COMPARISON:  None  FINDINGS: Aorta normal caliber without aneurysm or dissection.  Pulmonary arteries adequately opacified and patent.  No evidence of pulmonary embolism.  Visualized portion of liver and spleen normal appearance.  No thoracic adenopathy, though normal size lymph nodes are seen subcarinal and in both axilla.  Tiny BILATERAL pleural effusions with atelectasis in both lower lobes greater on LEFT.  6 mm diameter nodular density RIGHT upper lobe image 28.  6 mm nodular density LEFT lower lobe image 34.  Remaining lungs clear.  No pneumothorax or definite osseous findings.  Review of the MIP images confirms the above findings.  IMPRESSION: Small BILATERAL pleural effusions with subsegmental atelectasis in BILATERAL lower lobes.  No evidence pulmonary embolism.  BILATERAL 6 mm pulmonary nodules, recommendation below.  If the patient is at high risk for bronchogenic carcinoma, follow-up chest CT at 6-12 months is recommended. If the patient is at low risk for bronchogenic carcinoma, follow-up chest CT at  12 months is recommended. This recommendation follows the consensus statement: Guidelines for Management of Small Pulmonary Nodules Detected on CT Scans: A Statement from the Fleischner Society as published in Radiology 2005;237:395-400.   Electronically Signed   By: Ulyses Southward M.D.   On: 08/23/2014 16:01     Labs:  CBC:  Recent Labs  08/21/14 1225 08/22/14 0511 08/23/14 0419 08/24/14 0415  WBC 5.6 7.8 5.0 5.5  HGB 11.7* 11.3* 10.1* 10.6*  HCT 35.5* 33.4* 31.1* 32.0*  PLT 381 387 374 450*    COAGS:  Recent Labs  08/20/14 2005  INR 1.08    BMP:  Recent Labs  08/22/14 0511 08/23/14 0419 08/24/14 0415 08/25/14 0430  NA 137 140 143 142  K 2.9* 3.6 3.6 3.5  CL 101 107 106 105  CO2 25 26 27 28   GLUCOSE 89 120* 95 95  BUN 9 10 7 8   CALCIUM 8.3* 8.4* 8.6* 8.8*  CREATININE 0.59 0.56 0.47 0.52  GFRNONAA >60 >60 >60 >60  GFRAA >60 >60 >60 >60   LIVER FUNCTION TESTS:  Recent Labs  08/20/14 1140 08/21/14 1225  BILITOT 0.8 0.9  AST 96* 40  ALT 56* 35  ALKPHOS 246* 185*  PROT 7.3 6.1*  ALBUMIN 2.9* 2.3*   Assessment and Plan: Right psoas abscess s/p perc drain 6/2 Urosepsis, TTE and TEE negative for vegetations.  Right hydronephrosis s/p PCN 6/2 Urology MD spoke with Dr. 8/2 today and we will attempt to internalize PCN, repeat BCx no growth, afebrile, informed consent obtained  Perc drain, continue flushes and monitor output, repeat CT later in week prior to discharge with possible removal   Signed: 8/2 08/26/2014, 3:50 PM   I spent a total of 15 Minutes in face to face in clinical consultation/evaluation, greater than 50% of which was counseling/coordinating care for ureteral obstruction.

## 2014-08-26 NOTE — Progress Notes (Signed)
PT Cancellation Note  Patient Details Name: Deanna Ball MRN: [id number] DOB: 1960-12-20   Cancelled Treatment:    Reason Eval/Treat Not Completed: Patient at procedure or test/unavailable   Rebeca Alert, MPT Pager: [phone]

## 2014-08-26 NOTE — Clinical Social Work Placement (Signed)
   CLINICAL SOCIAL WORK PLACEMENT  NOTE  Date:  08/26/2014  Patient Details  Name: Deanna Ball MRN: [id number] Date of Birth: 1960/08/09  Clinical Social Work is seeking post-discharge placement for this patient at the Skilled  Nursing Facility level of care (*CSW will initial, date and re-position this form in  chart as items are completed):  Yes   Patient/family provided with [place] Clinical Social Work Department's list of facilities offering this level of care within the geographic area requested by the patient (or if unable, by the patient's family).  Yes   Patient/family informed of their freedom to choose among providers that offer the needed level of care, that participate in Medicare, Medicaid or managed care program needed by the patient, have an available bed and are willing to accept the patient.  Yes   Patient/family informed of [facility]'s ownership interest in [facility] and [facility], as well as of the fact that they are under no obligation to receive care at these facilities.  PASRR submitted to EDS on 08/26/14     PASRR number received on 08/26/14     Existing PASRR number confirmed on       FL2 transmitted to all facilities in geographic area requested by pt/family on 08/26/14     FL2 transmitted to all facilities within larger geographic area on       Patient informed that his/her managed care company has contracts with or will negotiate with certain facilities, including the following:        Yes   Patient/family informed of bed offers received.  Patient chooses bed at [facility]     Physician recommends and patient chooses bed at      Patient to be transferred to [facility] on  .  Patient to be transferred to facility by       Patient family notified on   of transfer.  Name of family member notified:        PHYSICIAN       Additional Comment:    _______________________________________________ [name],  LCSW 08/26/2014, 9:45 AM

## 2014-08-27 ENCOUNTER — Inpatient Hospital Stay (HOSPITAL_COMMUNITY): Payer: Medicare Other

## 2014-08-27 ENCOUNTER — Encounter (HOSPITAL_COMMUNITY): Payer: Self-pay | Admitting: Radiology

## 2014-08-27 DIAGNOSIS — N1330 Unspecified hydronephrosis: Secondary | ICD-10-CM

## 2014-08-27 DIAGNOSIS — N12 Tubulo-interstitial nephritis, not specified as acute or chronic: Secondary | ICD-10-CM

## 2014-08-27 LAB — BASIC METABOLIC PANEL
Anion gap: 8 (ref 5–15)
BUN: 17 mg/dL (ref 6–20)
CO2: 29 mmol/L (ref 22–32)
CREATININE: 0.79 mg/dL (ref 0.44–1.00)
Calcium: 9.1 mg/dL (ref 8.9–10.3)
Chloride: 104 mmol/L (ref 101–111)
GFR calc Af Amer: >60 mL/min (ref >60)
GFR calc non Af Amer: >60 mL/min (ref >60)
Glucose, Bld: 94 mg/dL (ref 65–99)
POTASSIUM: 3.8 mmol/L (ref 3.5–5.1)
Sodium: 141 mmol/L (ref 135–145)

## 2014-08-27 LAB — CBC
HEMATOCRIT: 34.5 % — AB (ref 36.0–46.0)
Hemoglobin: 11.4 g/dL — ABNORMAL LOW (ref 12.0–15.0)
MCH: 33.5 pg (ref 26.0–34.0)
MCHC: 33 g/dL (ref 30.0–36.0)
MCV: 101.5 fL — AB (ref 78.0–100.0)
Platelets: 667 10*3/uL — ABNORMAL HIGH (ref 150–400)
RBC: 3.4 MIL/uL — ABNORMAL LOW (ref 3.87–5.11)
RDW: 13.5 % (ref 11.5–15.5)
WBC: 10.5 10*3/uL (ref 4.0–10.5)

## 2014-08-27 LAB — CULTURE, BLOOD (ROUTINE X 2)
Culture: NO GROWTH
Culture: NO GROWTH

## 2014-08-27 MED ORDER — MIDAZOLAM HCL 2 MG/2ML IJ SOLN
INTRAMUSCULAR | Status: AC
  Filled 2014-08-27: qty 6

## 2014-08-27 MED ORDER — IOHEXOL 300 MG/ML  SOLN
100.0000 mL | Freq: Once | INTRAMUSCULAR | Status: AC | PRN
  Administered 2014-08-27: 80 mL via INTRAVENOUS

## 2014-08-27 MED ORDER — IOHEXOL 300 MG/ML  SOLN
50.0000 mL | Freq: Once | INTRAMUSCULAR | Status: AC | PRN
  Administered 2014-08-27: 10 mL

## 2014-08-27 MED ORDER — FENTANYL CITRATE (PF) 100 MCG/2ML IJ SOLN
INTRAMUSCULAR | Status: AC
  Filled 2014-08-27: qty 4

## 2014-08-27 NOTE — Progress Notes (Signed)
Patient ID: Deanna Ball, female   DOB: 09-28-1960, 54 y.o.   MRN: [id number]    Referring Physician(s): MacDiarmid,S   Subjective:  Pt feeling about the same; still has some rt flank discomfort and notes it more when abscess drain irrigated; states she is hungry; denies N/V  Allergies: Review of patient's allergies indicates no known allergies.  Medications: Prior to Admission medications   Medication Sig Start Date End Date Taking? Authorizing Provider  ALPRAZolam Prudy Feeler) 1 MG tablet Take 1 mg by mouth 2 (two) times daily.  07/31/14  Yes Historical Provider, MD  HYDROcodone-acetaminophen (NORCO/VICODIN) 5-325 MG per tablet  07/31/14  Yes Historical Provider, MD  methotrexate (RHEUMATREX) 2.5 MG tablet Take 25 mg by mouth 2 (two) times a week. Caution:Chemotherapy. Protect from light.   Yes Historical Provider, MD  naproxen sodium (ANAPROX) 220 MG tablet Take 220 mg by mouth 2 (two) times daily with a meal.   Yes Historical Provider, MD  NEXIUM 40 MG capsule  07/17/14  Yes Historical Provider, MD  predniSONE (DELTASONE) 2.5 MG tablet Take 2.5 mg by mouth 2 (two) times daily with a meal.   Yes Historical Provider, MD  Vitamin D, Ergocalciferol, (DRISDOL) 50000 UNITS CAPS capsule Take 50,000 Units by mouth every 7 (seven) days.   Yes Historical Provider, MD     Vital Signs: BP 136/81 mmHg  Pulse 68  Temp(Src) 97.3 F (36.3 C) (Oral)  Resp 16  Ht 5' 2.5" (1.588 m)  Wt 157 lb 3 oz (71.3 kg)  BMI 28.27 kg/m2  SpO2 98%  Physical Exam awake/alert; chest- sl dim BS bases; heart- RRR; abd- soft,+BS, mild diffuse tenderness; ext- FROM, trace edema; rt psoas drain intact, output minimal; rt PCN capped, no evidence of leaking  Imaging: Ct Angio Chest Pe W/cm &/or Wo Cm  08/23/2014   CLINICAL DATA:  Chest pain question pulmonary embolism, chest pain began this morning on RIGHT side near breast, shortness of breath, cough, history lupus, rheumatoid arthritis  EXAM: CT ANGIOGRAPHY CHEST  WITH CONTRAST  TECHNIQUE: Multidetector CT imaging of the chest was performed using the standard protocol during bolus administration of intravenous contrast. Multiplanar CT image reconstructions and MIPs were obtained to evaluate the vascular anatomy.  CONTRAST:  OMNIPAQUE IOHEXOL 350 MG/ML SOLN  COMPARISON:  None  FINDINGS: Aorta normal caliber without aneurysm or dissection.  Pulmonary arteries adequately opacified and patent.  No evidence of pulmonary embolism.  Visualized portion of liver and spleen normal appearance.  No thoracic adenopathy, though normal size lymph nodes are seen subcarinal and in both axilla.  Tiny BILATERAL pleural effusions with atelectasis in both lower lobes greater on LEFT.  6 mm diameter nodular density RIGHT upper lobe image 28.  6 mm nodular density LEFT lower lobe image 34.  Remaining lungs clear.  No pneumothorax or definite osseous findings.  Review of the MIP images confirms the above findings.  IMPRESSION: Small BILATERAL pleural effusions with subsegmental atelectasis in BILATERAL lower lobes.  No evidence pulmonary embolism.  BILATERAL 6 mm pulmonary nodules, recommendation below.  If the patient is at high risk for bronchogenic carcinoma, follow-up chest CT at 6-12 months is recommended. If the patient is at low risk for bronchogenic carcinoma, follow-up chest CT at 12 months is recommended. This recommendation follows the consensus statement: Guidelines for Management of Small Pulmonary Nodules Detected on CT Scans: A Statement from the Fleischner Society as published in Radiology 2005;237:395-400.   Electronically Signed   By: Angelyn Punt.D.  On: 08/23/2014 16:01   Ir Nephrostogram Right Thru Existing Access  08/26/2014   CLINICAL DATA:  Right psoas retroperitoneal abscess resulting in right hydronephrosis. Status post right nephrostomy and right CT retroperitoneal abscess drain insertion.  EXAM: FLUOROSCOPIC ANTEGRADE NEPHROSTOGRAM THROUGH THE EXISTING RIGHT  NEPHROSTOMY.  Date:  6/7/20166/09/2014 4:25 pm  Radiologist:  M. Ruel Favors, MD  Guidance:  FLUOROSCOPIC  FLUOROSCOPY TIME:  54 seconds  MEDICATIONS AND MEDICAL HISTORY: 3 mg Dilaudid  ANESTHESIA/SEDATION: None.  CONTRAST:  89mL OMNIPAQUE IOHEXOL 300 MG/ML  SOLN  COMPLICATIONS: None immediate  PROCEDURE: Informed consent was obtained from the patient following explanation of the procedure, risks, benefits and alternatives. The patient understands, agrees and consents for the procedure. All questions were addressed. A time out was performed.  Maximal barrier sterile technique utilized including caps, mask, sterile gowns, sterile gloves, large sterile drape, hand hygiene, and ChloraPrep.  Under sterile conditions and local anesthesia, the existing right nephrostomy catheter was injected with contrast. Fluoroscopic imaging performed. This demonstrates patency of the right collecting system and ureter. The ureter appears normal without obstruction. Contrast drains easily into the bladder. No evidence of ureteral obstruction, stricture, or filling defect in the area of the existing abscess drain catheter.  IMPRESSION: Antegrade nephrostogram confirms resolution of hydronephrosis related to the retroperitoneal abscess. Right ureter is patent without obstruction or stricture and drains spontaneously into the bladder.  Findings discussed with Dr Sherron Monday. Because the right ureter is patent and normal appearing by nephrostogram ureteral stent may not be necessary. Right nephrostomy catheter will be capped externally overnight has a trial of internal drainage. If this is tolerated, the nephrostomy catheter will be removed tomorrow.   Electronically Signed   By: Judie Petit.  Shick M.D.   On: 08/26/2014 16:53    Labs:  CBC:  Recent Labs  08/21/14 1225 08/22/14 0511 08/23/14 0419 08/24/14 0415  WBC 5.6 7.8 5.0 5.5  HGB 11.7* 11.3* 10.1* 10.6*  HCT 35.5* 33.4* 31.1* 32.0*  PLT 381 387 374 450*    COAGS:  Recent  Labs  08/20/14 2005  INR 1.08    BMP:  Recent Labs  08/22/14 0511 08/23/14 0419 08/24/14 0415 08/25/14 0430  NA 137 140 143 142  K 2.9* 3.6 3.6 3.5  CL 101 107 106 105  CO2 25 26 27 28   GLUCOSE 89 120* 95 95  BUN 9 10 7 8   CALCIUM 8.3* 8.4* 8.6* 8.8*  CREATININE 0.59 0.56 0.47 0.52  GFRNONAA >60 >60 >60 >60  GFRAA >60 >60 >60 >60    LIVER FUNCTION TESTS:  Recent Labs  08/20/14 1140 08/21/14 1225  BILITOT 0.8 0.9  AST 96* 40  ALT 56* 35  ALKPHOS 246* 185*  PROT 7.3 6.1*  ALBUMIN 2.9* 2.3*    Assessment and Plan: S/p right PCN,rt psoas abscess drainage 6/1; for repeat right nephrostogram today with possible ureteral stent if necessary; may also obtain f/u CT abd/pelvis to reassess psoas abscess with poss drain removal if collection resolved; labs pend today; above plans d/w pt with her consent   Signed: D. 10/21/14 08/27/2014, 11:28 AM   I spent a total of 15 minutes in face to face in clinical consultation/evaluation, greater than 50% of which was counseling/coordinating care for right PCN/psoas abscess

## 2014-08-27 NOTE — Progress Notes (Signed)
If patient tolerates capping of tube have IR remove it today and see me as an outpt

## 2014-08-27 NOTE — Progress Notes (Signed)
PATIENT DETAILS Name: Deanna Ball Age: 54 y.o. Sex: female Date of Birth: Jun 30, 1960 Admit Date: 08/20/2014 Admitting Physician Ozella Rocks, MD PCP:No primary care provider on file.  Brief Narrative: 54 yo female with hx of SLE on chronic prednisone/MTX admitted with worsening right sided flank pain. Found to have right perinephric abscess causing right sided hydronephroses and MSSA bacteremia. TEE negative. Has a percutaneous drain in the right perinephric abscess in place, nephrostomy tube remains in place. IR and urology consulted. Infectious disease recommends IV Ancef for a total of 8 weeks with last day being on 10/15/14. See below for details  Subjective: C/o cramping in right leg when drain squeezed Excited about [employer] tonight  Assessment/Plan: Abscess right retroperitoneum/perinephric abscess involving the psoas muscle and obstructing the right ureteral pelvic junction causing hydronephrosis: Underwent placement of right percutaneous nephrostomy tube and percutaneous drain into the right psoas/perinephric abscess by interventional radiology on 6/1. Abscess culture, blood culture, urine culture positive for MSSA.   -IR to see today   Sepsis: Secondary to above and MSSA bacteremia. Sepsis pathophysiology resolved.  Cultures and antibiotics as above.  MSSA bacteremia: Blood culture along with urine and abscess culture positive for MSSA.  TTE and TEE. negative for vegetations. Seen by infectious disease, recommendations are a total of 8 weeks of IV Ancef, with last day being 10/15/14. Patient will need weekly CBC with BMET faxed to Dr Clinton Gallant office at [phone]. Since repeat blood cultures on 6/2 continue to be negative -PICC Line  Right-sided chest pain:Pleuritic-CT Chest negative for PE. Follow.Supportive care- NSAID's-improving.  Anemia: Likely secondary to acute illness. Follow CBCs periodically.  Hypokalemia: Repleted  History of  lupus: Continue prednisone, hold methotrexate.  History of alcohol abuse: Drinks 2 bottles of wine daily. No signs of withdrawal currently-more than 7 days out from her last drink.   GERD: PPI.  Anxiety: Continue as needed Xanax.  Disposition: SNF once medically ready.  Antimicrobial agents  See below  Anti-infectives    Start     Dose/Rate Route Frequency Ordered Stop   08/22/14 1400  ceFAZolin (ANCEF) IVPB 2 g/50 mL premix     2 g 100 mL/hr over 30 Minutes Intravenous 3 times per day 08/22/14 1209     08/22/14 0200  vancomycin (VANCOCIN) IVPB 1000 mg/200 mL premix  Status:  Discontinued     1,000 mg 200 mL/hr over 60 Minutes Intravenous Every 12 hours 08/21/14 1213 08/23/14 0942   08/21/14 1500  cefTRIAXone (ROCEPHIN) 1 g in dextrose 5 % 50 mL IVPB  Status:  Discontinued     1 g 100 mL/hr over 30 Minutes Intravenous  Once 08/20/14 1702 08/21/14 1206   08/21/14 1300  vancomycin (VANCOCIN) 1,500 mg in sodium chloride 0.9 % 500 mL IVPB     1,500 mg 250 mL/hr over 120 Minutes Intravenous  Once 08/21/14 1213 08/21/14 1700   08/21/14 1230  Ampicillin-Sulbactam (UNASYN) 3 g in sodium chloride 0.9 % 100 mL IVPB  Status:  Discontinued     3 g 100 mL/hr over 60 Minutes Intravenous 4 times per day 08/21/14 1208 08/22/14 1209   08/20/14 2033  ceFAZolin (ANCEF) 2-3 GM-% IVPB SOLR    Comments:  Kevan Ny   : cabinet override      08/20/14 2033 08/20/14 2042   08/20/14 1515  cefTRIAXone (ROCEPHIN) 1 g in dextrose 5 % 50 mL IVPB  1 g 100 mL/hr over 30 Minutes Intravenous  Once 08/20/14 1504 08/20/14 1613      DVT Prophylaxis: Prophylactic  Heparin   Code Status: Full code   Family Communication Mother at bedside  Procedures: Perc Nephrostomy/Abscess drain 6/1  CONSULTS:  ID and urology   IR  Time spent 25 minutes-Greater than 50% of this time was spent in counseling, explanation of diagnosis, planning of further management, and coordination of  care.  MEDICATIONS: Scheduled Meds: . acetaminophen  1,000 mg Oral TID  . ALPRAZolam  1 mg Oral BID  .  ceFAZolin (ANCEF) IV  2 g Intravenous 3 times per day  . folic acid  1 mg Oral Daily  . heparin  5,000 Units Subcutaneous 3 times per day  . pantoprazole  40 mg Oral Daily  . polyethylene glycol  17 g Oral Daily  . predniSONE  2.5 mg Oral BID WC  . senna  2 tablet Oral QHS  . sodium chloride  1,000 mL Intravenous Once  . sodium chloride  3 mL Intravenous Q12H  . thiamine  100 mg Oral Daily   Continuous Infusions: . sodium chloride 10 mL/hr at 08/25/14 2253   PRN Meds:.morphine injection, ondansetron **OR** ondansetron (ZOFRAN) IV, oxyCODONE    PHYSICAL EXAM: Vital signs in last 24 hours: Filed Vitals:   08/26/14 1604 08/26/14 1613 08/26/14 2137 08/27/14 0556  BP: 140/79 154/79 135/77 136/81  Pulse: 83 75 66 68  Temp:   97.5 F (36.4 C) 97.3 F (36.3 C)  TempSrc:   Oral Oral  Resp: 18 17 16 16   Height:      Weight:      SpO2: 100% 98% 96% 98%    Weight change:  Filed Weights   08/20/14 1030 08/20/14 1824  Weight: 70.308 kg (155 lb) 71.3 kg (157 lb 3 oz)   Body mass index is 28.27 kg/(m^2).   Gen Exam: Awake and alert with clear speech.  Chest: B/L Clear.  No rales/rhochi CVS: S1 S2 Regular Abdomen: soft, BS +, non tender, non distended.  Extremities: no edema, lower extremities warm to touch Neurologic: Non Focal-sensation intact   Intake/Output from previous day:  Intake/Output Summary (Last 24 hours) at 08/27/14 1123 Last data filed at 08/27/14 0807  Gross per 24 hour  Intake    620 ml  Output   1600 ml  Net   -980 ml     LAB RESULTS: CBC  Recent Labs Lab 08/20/14 1140 08/21/14 1225 08/22/14 0511 08/23/14 0419 08/24/14 0415  WBC 16.3* 5.6 7.8 5.0 5.5  HGB 12.8 11.7* 11.3* 10.1* 10.6*  HCT 38.0 35.5* 33.4* 31.1* 32.0*  PLT 491* 381 387 374 450*  MCV 103.3* 102.9* 100.3* 103.0* 102.9*  MCH 34.8* 33.9 33.9 33.4 34.1*  MCHC 33.7 33.0  33.8 32.5 33.1  RDW 13.1 13.1 13.0 13.3 13.4  LYMPHSABS 1.3  --   --   --   --   MONOABS 1.0  --   --   --   --   EOSABS 0.0  --   --   --   --   BASOSABS 0.0  --   --   --   --     Chemistries   Recent Labs Lab 08/21/14 1225 08/22/14 0511 08/23/14 0419 08/24/14 0415 08/25/14 0430  NA 138 137 140 143 142  K 3.1* 2.9* 3.6 3.6 3.5  CL 104 101 107 106 105  CO2 23 25 26 27 28   GLUCOSE 93  89 120* 95 95  BUN 11 9 10 7 8   CREATININE 0.65 0.59 0.56 0.47 0.52  CALCIUM 8.2* 8.3* 8.4* 8.6* 8.8*  MG  --   --  1.9  --   --     CBG: No results for input(s): GLUCAP in the last 168 hours.  GFR Estimated Creatinine Clearance: 73.5 mL/min (by C-G formula based on Cr of 0.52).  Coagulation profile  Recent Labs Lab 08/20/14 2005  INR 1.08    Cardiac Enzymes  Recent Labs Lab 08/22/14 0952  TROPONINI <0.03    Invalid input(s): POCBNP No results for input(s): DDIMER in the last 72 hours. No results for input(s): HGBA1C in the last 72 hours. No results for input(s): CHOL, HDL, LDLCALC, TRIG, CHOLHDL, LDLDIRECT in the last 72 hours. No results for input(s): TSH, T4TOTAL, T3FREE, THYROIDAB in the last 72 hours.  Invalid input(s): FREET3 No results for input(s): VITAMINB12, FOLATE, FERRITIN, TIBC, IRON, RETICCTPCT in the last 72 hours. No results for input(s): LIPASE, AMYLASE in the last 72 hours.  Urine Studies No results for input(s): UHGB, CRYS in the last 72 hours.  Invalid input(s): UACOL, UAPR, USPG, UPH, UTP, UGL, UKET, UBIL, UNIT, UROB, ULEU, UEPI, UWBC, URBC, UBAC, CAST, UCOM, BILUA  MICROBIOLOGY: Recent Results (from the past 240 hour(s))  Urine culture     Status: None   Collection Time: 08/20/14  1:15 PM  Result Value Ref Range Status   Specimen Description URINE, CLEAN CATCH  Final   Special Requests NONE  Final   Colony Count   Final    >=100,000 COLONIES/ML Performed at [facility]    Culture   Final    STAPHYLOCOCCUS AUREUS Note: RIFAMPIN  AND GENTAMICIN SHOULD NOT BE USED AS SINGLE DRUGS FOR TREATMENT OF STAPH INFECTIONS. Performed at [facility]    Report Status 08/22/2014 FINAL  Final   Organism ID, Bacteria STAPHYLOCOCCUS AUREUS  Final      Susceptibility   Staphylococcus aureus - MIC*    GENTAMICIN <=0.5 SENSITIVE Sensitive     LEVOFLOXACIN >=8 RESISTANT Resistant     NITROFURANTOIN <=16 SENSITIVE Sensitive     OXACILLIN 0.5 SENSITIVE Sensitive     PENICILLIN RESISTANT      RIFAMPIN <=0.5 SENSITIVE Sensitive     TRIMETH/SULFA <=10 SENSITIVE Sensitive     VANCOMYCIN 1 SENSITIVE Sensitive     TETRACYCLINE <=1 SENSITIVE Sensitive     * STAPHYLOCOCCUS AUREUS  Culture, blood (routine x 2)     Status: None   Collection Time: 08/20/14  3:24 PM  Result Value Ref Range Status   Specimen Description BLOOD RIGHT ANTECUBITAL  Final   Special Requests BOTTLES DRAWN AEROBIC AND ANAEROBIC 8CC  Final   Culture   Final    STAPHYLOCOCCUS AUREUS Note: RIFAMPIN AND GENTAMICIN SHOULD NOT BE USED AS SINGLE DRUGS FOR TREATMENT OF STAPH INFECTIONS. SUSCEPTIBILITIES PERFORMED ON PREVIOUS CULTURE WITHIN THE LAST 5 DAYS. Note: Gram Stain Report Called to,Read Back By and Verified With:  ARMSTRONG S @ 0715 08/21/2014 BY RESSEGGER RESISTANT Performed at [facility]    Report Status 08/23/2014 FINAL  Final  Culture, blood (routine x 2)     Status: None   Collection Time: 08/20/14  3:33 PM  Result Value Ref Range Status   Specimen Description BLOOD RIGHT HAND  Final   Special Requests BOTTLES DRAWN AEROBIC ONLY 4CC  Final   Culture   Final    STAPHYLOCOCCUS AUREUS Note: RIFAMPIN AND GENTAMICIN SHOULD  NOT BE USED AS SINGLE DRUGS FOR TREATMENT OF STAPH INFECTIONS. Note: Gram Stain Report Called to,Read Back By and Verified With: ARMSTRONG S [facility]) AT 0715 ON 08/21/2014 BY RESSEGGER R Performed at [facility]    Report Status 08/23/2014 FINAL  Final   Organism ID, Bacteria STAPHYLOCOCCUS AUREUS  Final       Susceptibility   Staphylococcus aureus - MIC*    CLINDAMYCIN <=0.25 SENSITIVE Sensitive     ERYTHROMYCIN <=0.25 SENSITIVE Sensitive     GENTAMICIN <=0.5 SENSITIVE Sensitive     LEVOFLOXACIN >=8 RESISTANT Resistant     OXACILLIN 0.5 SENSITIVE Sensitive     PENICILLIN RESISTANT      RIFAMPIN <=0.5 SENSITIVE Sensitive     TRIMETH/SULFA <=10 SENSITIVE Sensitive     VANCOMYCIN <=0.5 SENSITIVE Sensitive     TETRACYCLINE <=1 SENSITIVE Sensitive     MOXIFLOXACIN 4 RESISTANT Resistant     * STAPHYLOCOCCUS AUREUS  MRSA PCR Screening     Status: Abnormal   Collection Time: 08/20/14  7:48 PM  Result Value Ref Range Status   MRSA by PCR POSITIVE (A) NEGATIVE Final    Comment:        The GeneXpert MRSA Assay (FDA approved for NASAL specimens only), is one component of a comprehensive MRSA colonization surveillance program. It is not intended to diagnose MRSA infection nor to guide or monitor treatment for MRSA infections. RESULT CALLED TO, READ BACK BY AND VERIFIED WITH: ARMSTRONG,S RN [phone] [id number] COVINGTON,N RESULT CALLED TO, READ BACK BY AND VERIFIED WITH: ARMSTRONG,S RN [phone] [id number] COV[place],N RESULT CALLED TO, READ BACK BY AND VERIFIED WITH: ARSTRONG,S RN [id number] COVINGTON,N   Culture, routine-abscess     Status: None   Collection Time: 08/20/14 10:16 PM  Result Value Ref Range Status   Specimen Description ABSCESS  Final   Special Requests NONE  Final   Gram Stain   Final    MODERATE WBC PRESENT,BOTH PMN AND MONONUCLEAR NO SQUAMOUS EPITHELIAL CELLS SEEN MODERATE GRAM POSITIVE COCCI IN PAIRS IN CLUSTERS Performed at [facility]    Culture   Final    ABUNDANT STAPHYLOCOCCUS AUREUS Note: RIFAMPIN AND GENTAMICIN SHOULD NOT BE USED AS SINGLE DRUGS FOR TREATMENT OF STAPH INFECTIONS. Performed at [facility]    Report Status 08/23/2014 FINAL  Final   Organism ID, Bacteria STAPHYLOCOCCUS AUREUS  Final      Susceptibility   Staphylococcus aureus - MIC*     CLINDAMYCIN <=0.25 SENSITIVE Sensitive     ERYTHROMYCIN <=0.25 SENSITIVE Sensitive     GENTAMICIN <=0.5 SENSITIVE Sensitive     LEVOFLOXACIN >=8 RESISTANT Resistant     OXACILLIN 0.5 SENSITIVE Sensitive     PENICILLIN 0.12 SENSITIVE Sensitive     RIFAMPIN <=0.5 SENSITIVE Sensitive     TRIMETH/SULFA <=10 SENSITIVE Sensitive     VANCOMYCIN 1 SENSITIVE Sensitive     TETRACYCLINE <=1 SENSITIVE Sensitive     MOXIFLOXACIN 4 RESISTANT Resistant     * ABUNDANT STAPHYLOCOCCUS AUREUS  Anaerobic culture     Status: None   Collection Time: 08/20/14 10:16 PM  Result Value Ref Range Status   Specimen Description ABSCESS  Final   Special Requests NONE  Final   Gram Stain   Final    MODERATE WBC PRESENT,BOTH PMN AND MONONUCLEAR NO SQUAMOUS EPITHELIAL CELLS SEEN MODERATE GRAM POSITIVE COCCI IN PAIRS IN CLUSTERS Performed at [facility]    Culture   Final    NO ANAEROBES ISOLATED Performed at  [facility]    Report Status 08/25/2014 FINAL  Final  Blood Cultures x 2 sites     Status: None   Collection Time: 08/21/14  4:50 PM  Result Value Ref Range Status   Specimen Description BLOOD RIGHT HAND  Final   Special Requests BOTTLES DRAWN AEROBIC ONLY 10CC  Final   Culture   Final    NO GROWTH 5 DAYS Performed at [facility]    Report Status 08/27/2014 FINAL  Final  Blood Cultures x 2 sites     Status: None   Collection Time: 08/21/14  5:05 PM  Result Value Ref Range Status   Specimen Description BLOOD RIGHT ARM  Final   Special Requests BOTTLES DRAWN AEROBIC AND ANAEROBIC 10CC  Final   Culture   Final    NO GROWTH 5 DAYS Performed at [facility]    Report Status 08/27/2014 FINAL  Final    RADIOLOGY STUDIES/RESULTS: Dg Cervical Spine Complete  08/22/2014   CLINICAL DATA:  Rheumatoid arthritis, potential planning for transesophageal echocardiography, history lupus  EXAM: CERVICAL SPINE  4+ VIEWS  COMPARISON:  None  FINDINGS: Straightening of cervical  lordosis question muscle spasm.  Prevertebral soft tissues normal to upper normal in thickness.  Vertebral body and disc space heights maintained.  No acute fracture, subluxation or bone destruction.  Predental space normal.  Osseous neural foramina grossly patent.  Lung apices clear.  Facet alignments normal.  C1-C2 alignment normal.  IMPRESSION: No acute abnormalities.   Electronically Signed   By: Ulyses Southward M.D.   On: 08/22/2014 14:44   Ct Angio Chest Pe W/cm &/or Wo Cm  08/23/2014   CLINICAL DATA:  Chest pain question pulmonary embolism, chest pain began this morning on RIGHT side near breast, shortness of breath, cough, history lupus, rheumatoid arthritis  EXAM: CT ANGIOGRAPHY CHEST WITH CONTRAST  TECHNIQUE: Multidetector CT imaging of the chest was performed using the standard protocol during bolus administration of intravenous contrast. Multiplanar CT image reconstructions and MIPs were obtained to evaluate the vascular anatomy.  CONTRAST:  OMNIPAQUE IOHEXOL 350 MG/ML SOLN  COMPARISON:  None  FINDINGS: Aorta normal caliber without aneurysm or dissection.  Pulmonary arteries adequately opacified and patent.  No evidence of pulmonary embolism.  Visualized portion of liver and spleen normal appearance.  No thoracic adenopathy, though normal size lymph nodes are seen subcarinal and in both axilla.  Tiny BILATERAL pleural effusions with atelectasis in both lower lobes greater on LEFT.  6 mm diameter nodular density RIGHT upper lobe image 28.  6 mm nodular density LEFT lower lobe image 34.  Remaining lungs clear.  No pneumothorax or definite osseous findings.  Review of the MIP images confirms the above findings.  IMPRESSION: Small BILATERAL pleural effusions with subsegmental atelectasis in BILATERAL lower lobes.  No evidence pulmonary embolism.  BILATERAL 6 mm pulmonary nodules, recommendation below.  If the patient is at high risk for bronchogenic carcinoma, follow-up chest CT at 6-12 months is  recommended. If the patient is at low risk for bronchogenic carcinoma, follow-up chest CT at 12 months is recommended. This recommendation follows the consensus statement: Guidelines for Management of Small Pulmonary Nodules Detected on CT Scans: A Statement from the Fleischner Society as published in Radiology 2005;237:395-400.   Electronically Signed   By: Ulyses Southward M.D.   On: 08/23/2014 16:01   Mr Lumbar Spine W Wo Contrast  08/22/2014   CLINICAL DATA:  Retroperitoneal abscess. 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  EXAM: MRI LUMBAR SPINE WITHOUT AND WITH CONTRAST  TECHNIQUE: Multiplanar and multiecho pulse sequences of the lumbar spine were obtained without and with intravenous contrast.  CONTRAST:  15mL MULTIHANCE GADOBENATE DIMEGLUMINE 529 MG/ML IV SOLN  COMPARISON:  CT 08/20/2014.  FINDINGS: Segmentation: The numbering convention used for this exam termed L5-S1 as the last intervertebral disc space. Numbering was correlated with prior CT.  Alignment: Grade I anterolisthesis of L5 on S1 is a chronic finding and probably associated with old pars defects. Decompression with solid posterior lateral fusion.  Vertebrae: Benign hemangioma present at T12. Degenerative endplate changes are present at L1-L2. L4 inferior endplate edema appears discogenic and shows mild post gadolinium enhancement. This is unlikely to represent discogenic infection given the degenerated disc and vacuum disc seen on prior CT 08/20/2014.  Conus medullaris: Normal termination at L1.  Paraspinal tissues: RIGHT nephrostomy and RIGHT sella is drains noted. No definite interval change compared to prior abdominal CT.  Disc levels:  Disc Signal: Disc degeneration at L4-L5. Partial ossification of the L5-S1 disc associated with posterior lateral fusion and posterior decompression.  L1-L2:  Negative.  L2-L3:  Facet hypertrophy without stenosis.  Normal disc.  L3-L4:  Mild facet degeneration.  No stenosis.  L4-L5: There is  distraction of the LEFT facet joint with some fluid in the joint and enhancing granulation tissue or synovitis in the posterior aspect of the joint. The joint is degenerated and given the distraction, this is most compatible with ordinary degenerative changes and facet osteoarthritis. Infection of the facet joint is considered unlikely. There is little if any periarticular edema around the LEFT L4-L5 facet joint. The RIGHT facet joint appears normal. An isolated septic facet arthritis even in the setting of psoas abscess is unlikely although there is imaging overlap between the appearance is of infection and degenerative disease.  Disc degeneration. Shallow broad-based posterior disc protrusion superimposed on disc bulging. Bilateral L4 laminotomies have been performed and central canal is decompressed. There is bilateral subarticular stenosis. LEFT foraminal stenosis is moderate, associated with degenerative disc and facet disease.  L5-S1: Partial ossification of the disc. Solid posterior lateral fusion and laminectomy with good decompression of the central canal.  IMPRESSION: 1. No convincing findings of spinal infection in this patient with psoas abscess and drainage. Mild inflammatory changes are present at the LEFT L4-L5 facet joint that are more compatible with ordinary degenerative disease than septic arthritis. Although there is imaging overlap between the appearances, septic arthritis is unlikely. 2. Solid L5-S1 fusion and decompression. 3. 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   Electronically Signed   By: Andreas Newport M.D.   On: 08/22/2014 08:25   Ct Abdomen Pelvis W Contrast  08/20/2014   CLINICAL DATA:  Right upper quadrant abdominal pain for 1 week with nausea and vomiting. History of appendectomy, hernia repair and systemic lupus erythematosus. Moderate leukocytosis. Initial encounter.  EXAM: CT ABDOMEN AND  PELVIS WITH CONTRAST  TECHNIQUE: Multidetector CT imaging of the abdomen and pelvis was performed using the standard protocol following bolus administration of intravenous contrast.  CONTRAST:  100 ml Omnipaque 300  COMPARISON:  Ultrasound same date.  FINDINGS: Minimal dependent atelectasis at both lung bases. Otherwise clear. No pleural or pericardial effusion.  Lower chest: Mildly decreased hepatic density suspicious for steatosis. No focal lesions identified.  Hepatobiliary: The liver is normal in density without focal abnormality. No evidence of gallstones, gallbladder wall thickening or biliary dilatation.  Pancreas: Unremarkable. No pancreatic ductal dilatation or surrounding inflammatory changes.  Spleen: Normal in size without focal abnormality.  Adrenals/Urinary Tract: Both adrenal glands appear normal.The left kidney appears normal. The right kidney demonstrates hydronephrosis and delayed contrast excretion. There is asymmetric perinephric soft tissue stranding on the right. There is a complex fluid collection or centrally necrotic mass adjacent to the ureteropelvic junction, measuring 5.6 x 3.2 cm transverse on image 46. This extends into the right psoas muscle. No urinary tract calculi demonstrated. The distal ureters and bladder appear unremarkable.  Stomach/Bowel: No evidence of bowel wall thickening, distention or surrounding inflammatory change.Mild sigmoid colon diverticulosis without surrounding inflammation.  Vascular/Lymphatic: There are several mildly prominent retroperitoneal lymph nodes, likely reactive. No mesenteric adenopathy. Mild aortoiliac atherosclerosis. The IVC and renal veins appear patent.  Reproductive: The uterus appears unremarkable.  No adnexal mass.  Other: No evidence of abdominal wall mass or hernia.  Musculoskeletal: No acute or significant osseous findings. Mild lumbar spondylosis noted. There are postsurgical changes at the lumbosacral junction with a grade 1  anterolisthesis. Moderate facet hypertrophy noted at L4-5. The paraspinal fat planes are maintained. No evidence of discitis or osteomyelitis P  IMPRESSION: 1. Suspected abscess within the right retroperitoneum involving the psoas muscle and obstructing the right ureteral pelvic junction. This is an atypical location, and necrotic neoplasm cannot be completely excluded. No other cause for ureteral obstruction identified. 2. Small retroperitoneal lymph nodes are probably reactive. No other suspicious masses identified. 3. No evidence of urinary tract calculus. 4. Urology consultation recommended. Results were discussed by telephone with Dr. Fayrene Fearing.   Electronically Signed   By: Carey Bullocks M.D.   On: 08/20/2014 14:38   Ir Nephrostogram Right Thru Existing Access  08/26/2014   CLINICAL DATA:  Right psoas retroperitoneal abscess resulting in right hydronephrosis. Status post right nephrostomy and right CT retroperitoneal abscess drain insertion.  EXAM: FLUOROSCOPIC ANTEGRADE NEPHROSTOGRAM THROUGH THE EXISTING RIGHT NEPHROSTOMY.  Date:  6/7/20166/09/2014 4:25 pm  Radiologist:  M. Ruel Favors, MD  Guidance:  FLUOROSCOPIC  FLUOROSCOPY TIME:  54 seconds  MEDICATIONS AND MEDICAL HISTORY: 3 mg Dilaudid  ANESTHESIA/SEDATION: None.  CONTRAST:  39mL OMNIPAQUE IOHEXOL 300 MG/ML  SOLN  COMPLICATIONS: None immediate  PROCEDURE: Informed consent was obtained from the patient following explanation of the procedure, risks, benefits and alternatives. The patient understands, agrees and consents for the procedure. All questions were addressed. A time out was performed.  Maximal barrier sterile technique utilized including caps, mask, sterile gowns, sterile gloves, large sterile drape, hand hygiene, and ChloraPrep.  Under sterile conditions and local anesthesia, the existing right nephrostomy catheter was injected with contrast. Fluoroscopic imaging performed. This demonstrates patency of the right collecting system and ureter. The  ureter appears normal without obstruction. Contrast drains easily into the bladder. No evidence of ureteral obstruction, stricture, or filling defect in the area of the existing abscess drain catheter.  IMPRESSION: Antegrade nephrostogram confirms resolution of hydronephrosis related to the retroperitoneal abscess. Right ureter is patent without obstruction or stricture and drains spontaneously into the bladder.  Findings discussed with Dr Sherron Monday. Because the right ureter is patent and normal appearing by nephrostogram ureteral stent may not be necessary. Right nephrostomy catheter will be capped externally overnight has a trial of internal drainage. If this is tolerated, the nephrostomy catheter will be removed tomorrow.   Electronically Signed   By: Judie Petit.  Shick M.D.   On: 08/26/2014 16:53   Ir Nephrostomy Placement Right  08/21/2014   CLINICAL DATA:  54 year old female with sepsis secondary to right psoas/ perinephric abscess. This abscess collection results in at least partial obstruction of the proximal ureter and right hydronephrosis. Additionally, there is clinical concern for possible pyonephrosis of the obstructed kidney. Therefore, percutaneous nephrostomy tube placement is warranted. This will then be followed by percutaneous drainage of the psoas/perinephric abscess.  EXAM: IR NEPHROSTOMY PLACEMENT RIGHT  Date: 08/21/2014  PROCEDURE: 1. Ultrasound-guided puncture right renal collecting system 2. Placement of a 10 French percutaneous nephrostomy tube using fluoroscopic guidance Interventional Radiologist:  Sterling Big, MD  ANESTHESIA/SEDATION: Moderate (conscious) sedation was used. 2 mg Versed, 100 mcg Fentanyl were administered intravenously. The patient's vital signs were monitored continuously by radiology nursing throughout the procedure.  Sedation Time: 25 minutes  MEDICATIONS: 2 g Ancef administered intravenously  FLUOROSCOPY TIME:  4 minutes 30 seconds  81 mGy  CONTRAST:  50mL OMNIPAQUE  IOHEXOL 300 MG/ML  SOLN  TECHNIQUE: Informed consent was obtained from the patient following explanation of the procedure, risks, benefits and alternatives. The patient understands, agrees and consents for the procedure. All questions were addressed. A time out was performed.  Maximal barrier sterile technique utilized including caps, mask, sterile gowns, sterile gloves, large sterile drape, hand hygiene, and Betadine skin prep.  The right flank was interrogated with ultrasound. A suitable posterior inferior calyx was identified. Local anesthesia was attained by infiltration with 1% lidocaine. A small dermatotomy was made. Using real-time ultrasound guidance, the posterior inferior calyx was punctured with a 21 gauge Accustick needle. Free return of turbid urine from the needle once the stylet was removed confirmed its location within the collecting system. A gentle hand injection of contrast material opacified the selected lower pole calyx and infundibulum.  A night tracks wire was then advanced into the renal pelvis. The Accustick needle was exchanged for the Accustick sheath. A Bentson wire was then coiled in the renal pelvis and the Accustick sheath removed. The skin tract was dilated to 10 [place] and a [place] all-purpose drainage catheter was then advanced over the wire and formed with the locking loop in the renal pelvis. A final image was obtained after confirming location within the renal pelvis by gentle hand injection of contrast.  The nephrostomy tube was then secured to the skin with 0 Prolene suture and a sterile bandage. The catheter was connected to gravity bag drainage. The patient tolerated the procedure well.  COMPLICATIONS: None  IMPRESSION: Technically successful placement of a 10 French right-sided percutaneous nephrostomy tube.  Signed,  Sterling Big, MD  [facility]  [facility]   Electronically Signed   By: Malachy Moan M.D.    On: 08/21/2014 16:52   Ct Image Guided Drainage By Percutaneous Catheter  08/21/2014   CLINICAL DATA:  54 year old female with a right retroperitoneal abscess and associated hydronephrosis. CT-guided drain placement is warranted in the setting of sepsis.  EXAM: CT IMAGE GUIDED DRAINAGE BY PERCUTANEOUS CATHETER  Date: 08/21/2014  PROCEDURE: 1. CT-guided drain placement in the right psoas muscle/perinephric space Interventional Radiologist:  Sterling Big, MD  ANESTHESIA/SEDATION: Moderate (conscious) sedation was used. 0.5 mg Versed, 25 mcg Fentanyl were administered intravenously. The patient's vital signs were monitored continuously by radiology nursing throughout the procedure.  Sedation Time: 15 minutes  MEDICATIONS: 2 g Ancef given within 1 hour of skin incision  TECHNIQUE: Informed consent was obtained from the patient following explanation of the procedure, risks, benefits and alternatives. The patient understands, agrees and consents for  the procedure. All questions were addressed. A time out was performed.  A planning axial CT scan was performed. The fluid collection within the right psoas musculature was successfully identified. A suitable skin entry site was selected and marked. The region was then sterilely prepped and draped in standard fashion using Betadine skin prep. Local anesthesia was attained by infiltration with 1% lidocaine. A small dermatotomy was made. Under intermittent CT fluoroscopic guidance, an 18 gauge introducer needle was advanced into the fluid collection. An Amplatz wire was then coiled within the fluid collection in the skin tract dilated to 12 [place]. A Cook 12 [place] all-purpose drainage catheter was then advanced over the wire and formed within the fluid collection. Aspiration yields 20 mL of thick, frankly purulent material. A sample was sent for Gram stain and culture.  Axial CT imaging was then performed confirming good positioning of the drainage catheter. The fluid  collection has notably decreased in size. The drainage tube was then secured to the skin with 0 Prolene suture and an adhesive fixation device. The tube was connected to JP bulb suction.  COMPLICATIONS: None  IMPRESSION: Successful placement of a 12 French percutaneous drainage catheter in the right psoas/perinephric fluid collection.  Aspiration yields 20 mL thick, frankly purulent material. Sample was sent for culture.  PLAN: Once drainage has been minimal (less than 20 mL) for several days, recommend repeat CT scan with contrast to ensure resolution of the abscess prior to tube removal.  Signed,  Sterling Big, MD  [facility]  [facility]   Electronically Signed   By: Malachy Moan M.D.   On: 08/21/2014 18:00   US Abdomen Limited Ruq  08/20/2014   CLINICAL DATA:  Sharp RIGHT upper quadrant pain for 1 week, history lupus, rheumatoid arthritis  EXAM: US ABDOMEN LIMITED - RIGHT UPPER QUADRANT  COMPARISON:  None  FINDINGS: Gallbladder:  Minimal dependent sludge in gallbladder. No shadowing gallstones, gallbladder wall thickening or pericholecystic fluid.  Common bile duct:  Diameter: Normal caliber 3 mm diameter  Liver:  Echogenic, likely fatty infiltration, though this can be seen with cirrhosis and certain infiltrative disorders. No focal hepatic mass or nodularity. Hepatopetal portal venous flow.  No RIGHT upper quadrant free fluid.  IMPRESSION: Probable fatty infiltration of liver as above.  Minimal gallbladder sludge.  Otherwise negative exam.   Electronically Signed   By: Ulyses Southward M.D.   On: 08/20/2014 13:04    Chalmers Cater  [facility] Pager:[phone]  If 7PM-7AM, please contact night-coverage [email or web address] Password [email or web address] 08/27/2014, 11:23 AM   LOS: 7 days

## 2014-08-27 NOTE — Progress Notes (Signed)
Physical Therapy Treatment Patient Details Name: Deanna Ball MRN: [id number] DOB: [birth year] Today's Date: 08/27/2014    History of Present Illness Pt is a 54 year old female admitted for abscess right retroperitoneum/perinephric abscess involving the psoas muscle and obstructing the right ureteral pelvic junction causing hydronephrosis and underwent placement of right percutaneous nephrostomy tube and percutaneous drain into the right psoas/perinephric abscess by interventional radiology on 6/1 with hx of lupus and RA.    PT Comments    Assisted pt OOB to amb a greater distance in hallway using a RW due to unsteadiness and pain. Pt will need ST Rehab at SNF prior to returning home to regain prior level of mobility and function.   Follow Up Recommendations  SNF;Supervision for mobility/OOB     Equipment Recommendations  None recommended by PT    Recommendations for Other Services       Precautions / Restrictions Precautions Precautions: Fall Precaution Comments: R posterior PCN and JP drains Restrictions Weight Bearing Restrictions: No    Mobility  Bed Mobility Overal bed mobility: Modified Independent             General bed mobility comments: increased time and effort with HOB elevated and using L rail (towards L side due to R drains)  Transfers Overall transfer level: Needs assistance Equipment used: Rolling walker (2 wheeled) Transfers: Sit to/from Stand Sit to Stand: Min assist         General transfer comment: verbal cues for safety, slight assist to steady, pivoted to [facility]  Ambulation/Gait Ambulation/Gait assistance: Min guard Ambulation Distance (Feet): 42 Feet   Gait Pattern/deviations: Step-to pattern;Step-through pattern;Trunk flexed     General Gait Details: very unsteady gait with limited activity tolerance.    Stairs            Wheelchair Mobility    Modified Rankin (Stroke Patients Only)       Balance                                     Cognition Arousal/Alertness: Awake/alert Behavior During Therapy: WFL for tasks assessed/performed Overall Cognitive Status: Within Functional Limits for tasks assessed                      Exercises      General Comments        Pertinent Vitals/Pain      Home Living                      Prior Function            PT Goals (current goals can now be found in the care plan section) Progress towards PT goals: Progressing toward goals    Frequency  Min 3X/week    PT Plan Current plan remains appropriate    Co-evaluation             End of Session Equipment Utilized During Treatment: Gait belt Activity Tolerance: Patient tolerated treatment well Patient left: in bed;with call bell/phone within reach;with family/visitor present     Time: 0258-5277 PT Time Calculation (min) (ACUTE ONLY): 23 min  Charges:  $Gait Training: 8-22 mins $Therapeutic Activity: 8-22 mins                    G Codes:      Felecia Shelling  PTA [facility]  Acute  Rehab Pager  [phone]   

## 2014-08-28 DIAGNOSIS — N151 Renal and perinephric abscess: Secondary | ICD-10-CM

## 2014-08-28 DIAGNOSIS — F1010 Alcohol abuse, uncomplicated: Secondary | ICD-10-CM

## 2014-08-28 MED ORDER — SODIUM CHLORIDE 0.9 % IJ SOLN
10.0000 mL | INTRAMUSCULAR | Status: DC | PRN
  Administered 2014-08-29 (×2): 10 mL
  Filled 2014-08-28 (×2): qty 40

## 2014-08-28 NOTE — Progress Notes (Signed)
PT Cancellation Note  Patient Details Name: Deanna Ball MRN: [id number] DOB: 1960/07/09   Cancelled Treatment:     PICC LINE placement   Armando Reichert 08/28/2014, 3:07 PM

## 2014-08-28 NOTE — Progress Notes (Signed)
PATIENT DETAILS Name: Deanna Ball Age: 54 y.o. Sex: female Date of Birth: 1961-02-13 Admit Date: 08/20/2014 Admitting Physician Ozella Rocks, MD PCP:No primary care provider on file.  Brief Narrative: 54 yo female with hx of SLE on chronic prednisone/MTX admitted with worsening right sided flank pain. Found to have right perinephric abscess causing right sided hydronephroses and MSSA bacteremia. TEE negative. Has a percutaneous drain in the right perinephric abscess in place, nephrostomy tube remains in place. IR and urology consulted. Infectious disease recommends IV Ancef for a total of 8 weeks with last day being on 10/15/14. See below for details  Subjective: REPORTS FEELING SORE all over. But feels much better.   Assessment/Plan: Abscess right retroperitoneum/perinephric abscess involving the psoas muscle and obstructing the right ureteral pelvic junction causing hydronephrosis: Underwent placement of right percutaneous nephrostomy tube and percutaneous drain into the right psoas/perinephric abscess by interventional radiology on 6/1. Abscess culture, blood culture, urine culture positive for MSSA.   Repeat CT showed complete resolution of the right hydronephrosis and perinephric psoas abscess. And the right retroperitoneal abscess drainage catheter was removed. She underwent antegrade nephrostogram and was found to have normal right antegrade nephro ureterogram. There is no evidence of ureteral stenosis, stricture or slow drainage.   Sepsis: Secondary to above and MSSA bacteremia. Sepsis pathophysiology resolved.  Cultures and antibiotics as above.  MSSA bacteremia: Blood culture along with urine and abscess culture positive for MSSA.  TTE and TEE. negative for vegetations. Seen by infectious disease, recommendations are a total of 8 weeks of IV Ancef, with last day being 10/15/14. Patient will need weekly CBC with BMET faxed to Dr Clinton Gallant office at [phone].  Since repeat blood cultures on 6/2 continue to be negative -PICC Line to be placed.  Right-sided chest pain:Pleuritic-CT Chest negative for PE. Follow.Supportive care- NSAID's-improving.  Anemia: Likely secondary to acute illness. Follow CBCs periodically.  Hypokalemia: Repleted  History of lupus: Continue prednisone, hold methotrexate.  History of alcohol abuse: Drinks 2 bottles of wine daily. No signs of withdrawal currently-more than 7 days out from her last drink.   GERD: PPI.  Anxiety: Continue as needed Xanax.  Disposition: SNF once medically ready.  Antimicrobial agents  See below  Anti-infectives    Start     Dose/Rate Route Frequency Ordered Stop   08/22/14 1400  ceFAZolin (ANCEF) IVPB 2 g/50 mL premix     2 g 100 mL/hr over 30 Minutes Intravenous 3 times per day 08/22/14 1209     08/22/14 0200  vancomycin (VANCOCIN) IVPB 1000 mg/200 mL premix  Status:  Discontinued     1,000 mg 200 mL/hr over 60 Minutes Intravenous Every 12 hours 08/21/14 1213 08/23/14 0942   08/21/14 1500  cefTRIAXone (ROCEPHIN) 1 g in dextrose 5 % 50 mL IVPB  Status:  Discontinued     1 g 100 mL/hr over 30 Minutes Intravenous  Once 08/20/14 1702 08/21/14 1206   08/21/14 1300  vancomycin (VANCOCIN) 1,500 mg in sodium chloride 0.9 % 500 mL IVPB     1,500 mg 250 mL/hr over 120 Minutes Intravenous  Once 08/21/14 1213 08/21/14 1700   08/21/14 1230  Ampicillin-Sulbactam (UNASYN) 3 g in sodium chloride 0.9 % 100 mL IVPB  Status:  Discontinued     3 g 100 mL/hr over 60 Minutes Intravenous 4 times per day 08/21/14 1208 08/22/14 1209   08/20/14 2033  ceFAZolin (ANCEF) 2-3  GM-% IVPB SOLR    Comments:  Kevan Ny   : cabinet override      08/20/14 2033 08/20/14 2042   08/20/14 1515  cefTRIAXone (ROCEPHIN) 1 g in dextrose 5 % 50 mL IVPB     1 g 100 mL/hr over 30 Minutes Intravenous  Once 08/20/14 1504 08/20/14 1613      DVT Prophylaxis: Prophylactic  Heparin   Code Status: Full code   Family  Communication Mother at bedside  Procedures: Perc Nephrostomy/Abscess drain 6/1  CONSULTS:  ID and urology   IR  Time spent 25 minutes-Greater than 50% of this time was spent in counseling, explanation of diagnosis, planning of further management, and coordination of care.  MEDICATIONS: Scheduled Meds: . acetaminophen  1,000 mg Oral TID  . ALPRAZolam  1 mg Oral BID  .  ceFAZolin (ANCEF) IV  2 g Intravenous 3 times per day  . folic acid  1 mg Oral Daily  . heparin  5,000 Units Subcutaneous 3 times per day  . pantoprazole  40 mg Oral Daily  . polyethylene glycol  17 g Oral Daily  . predniSONE  2.5 mg Oral BID WC  . senna  2 tablet Oral QHS  . sodium chloride  1,000 mL Intravenous Once  . sodium chloride  3 mL Intravenous Q12H  . thiamine  100 mg Oral Daily   Continuous Infusions: . sodium chloride 10 mL/hr at 08/28/14 0947   PRN Meds:.morphine injection, ondansetron **OR** ondansetron (ZOFRAN) IV, oxyCODONE, sodium chloride    PHYSICAL EXAM: Vital signs in last 24 hours: Filed Vitals:   08/27/14 1346 08/27/14 2017 08/28/14 0436 08/28/14 1414  BP: 128/66 132/58 118/58 125/60  Pulse: 69 67 70 83  Temp: 97.6 F (36.4 C) 98 F (36.7 C) 97.9 F (36.6 C) 97.9 F (36.6 C)  TempSrc: Oral Oral Oral Oral  Resp: 20 20 18 16   Height:      Weight:      SpO2: 100% 100% 94% 97%    Weight change:  Filed Weights   08/20/14 1030 08/20/14 1824  Weight: 70.308 kg (155 lb) 71.3 kg (157 lb 3 oz)   Body mass index is 28.27 kg/(m^2).   Gen Exam: Awake and alert with clear speech.  Chest: B/L Clear.  No rales/rhochi CVS: S1 S2 Regular Abdomen: soft, BS +, non tender, non distended.  Extremities: no edema, lower extremities warm to touch Neurologic: Non Focal-sensation intact   Intake/Output from previous day:  Intake/Output Summary (Last 24 hours) at 08/28/14 1854 Last data filed at 08/28/14 1847  Gross per 24 hour  Intake 1268.5 ml  Output   2850 ml  Net -1581.5 ml      LAB RESULTS: CBC  Recent Labs Lab 08/22/14 0511 08/23/14 0419 08/24/14 0415 08/27/14 1218  WBC 7.8 5.0 5.5 10.5  HGB 11.3* 10.1* 10.6* 11.4*  HCT 33.4* 31.1* 32.0* 34.5*  PLT 387 374 450* 667*  MCV 100.3* 103.0* 102.9* 101.5*  MCH 33.9 33.4 34.1* 33.5  MCHC 33.8 32.5 33.1 33.0  RDW 13.0 13.3 13.4 13.5    Chemistries   Recent Labs Lab 08/22/14 0511 08/23/14 0419 08/24/14 0415 08/25/14 0430 08/27/14 1218  NA 137 140 143 142 141  K 2.9* 3.6 3.6 3.5 3.8  CL 101 107 106 105 104  CO2 25 26 27 28 29   GLUCOSE 89 120* 95 95 94  BUN 9 10 7 8 17   CREATININE 0.59 0.56 0.47 0.52 0.79  CALCIUM 8.3* 8.4* 8.6*  8.8* 9.1  MG  --  1.9  --   --   --     CBG: No results for input(s): GLUCAP in the last 168 hours.  GFR Estimated Creatinine Clearance: 73.5 mL/min (by C-G formula based on Cr of 0.79).  Coagulation profile No results for input(s): INR, PROTIME in the last 168 hours.  Cardiac Enzymes  Recent Labs Lab 08/22/14 0952  TROPONINI <0.03    Invalid input(s): POCBNP No results for input(s): DDIMER in the last 72 hours. No results for input(s): HGBA1C in the last 72 hours. No results for input(s): CHOL, HDL, LDLCALC, TRIG, CHOLHDL, LDLDIRECT in the last 72 hours. No results for input(s): TSH, T4TOTAL, T3FREE, THYROIDAB in the last 72 hours.  Invalid input(s): FREET3 No results for input(s): VITAMINB12, FOLATE, FERRITIN, TIBC, IRON, RETICCTPCT in the last 72 hours. No results for input(s): LIPASE, AMYLASE in the last 72 hours.  Urine Studies No results for input(s): UHGB, CRYS in the last 72 hours.  Invalid input(s): UACOL, UAPR, USPG, UPH, UTP, UGL, UKET, UBIL, UNIT, UROB, ULEU, UEPI, UWBC, URBC, UBAC, CAST, UCOM, BILUA  MICROBIOLOGY: Recent Results (from the past 240 hour(s))  Urine culture     Status: None   Collection Time: 08/20/14  1:15 PM  Result Value Ref Range Status   Specimen Description URINE, CLEAN CATCH  Final   Special Requests NONE   Final   Colony Count   Final    >=100,000 COLONIES/ML Performed at [facility]    Culture   Final    STAPHYLOCOCCUS AUREUS Note: RIFAMPIN AND GENTAMICIN SHOULD NOT BE USED AS SINGLE DRUGS FOR TREATMENT OF STAPH INFECTIONS. Performed at [facility]    Report Status 08/22/2014 FINAL  Final   Organism ID, Bacteria STAPHYLOCOCCUS AUREUS  Final      Susceptibility   Staphylococcus aureus - MIC*    GENTAMICIN <=0.5 SENSITIVE Sensitive     LEVOFLOXACIN >=8 RESISTANT Resistant     NITROFURANTOIN <=16 SENSITIVE Sensitive     OXACILLIN 0.5 SENSITIVE Sensitive     PENICILLIN RESISTANT      RIFAMPIN <=0.5 SENSITIVE Sensitive     TRIMETH/SULFA <=10 SENSITIVE Sensitive     VANCOMYCIN 1 SENSITIVE Sensitive     TETRACYCLINE <=1 SENSITIVE Sensitive     * STAPHYLOCOCCUS AUREUS  Culture, blood (routine x 2)     Status: None   Collection Time: 08/20/14  3:24 PM  Result Value Ref Range Status   Specimen Description BLOOD RIGHT ANTECUBITAL  Final   Special Requests BOTTLES DRAWN AEROBIC AND ANAEROBIC 8CC  Final   Culture   Final    STAPHYLOCOCCUS AUREUS Note: RIFAMPIN AND GENTAMICIN SHOULD NOT BE USED AS SINGLE DRUGS FOR TREATMENT OF STAPH INFECTIONS. SUSCEPTIBILITIES PERFORMED ON PREVIOUS CULTURE WITHIN THE LAST 5 DAYS. Note: Gram Stain Report Called to,Read Back By and Verified With:  ARMSTRONG S @ 0715 08/21/2014 BY RESSEGGER RESISTANT Performed at [facility]    Report Status 08/23/2014 FINAL  Final  Culture, blood (routine x 2)     Status: None   Collection Time: 08/20/14  3:33 PM  Result Value Ref Range Status   Specimen Description BLOOD RIGHT HAND  Final   Special Requests BOTTLES DRAWN AEROBIC ONLY 4CC  Final   Culture   Final    STAPHYLOCOCCUS AUREUS Note: RIFAMPIN AND GENTAMICIN SHOULD NOT BE USED AS SINGLE DRUGS FOR TREATMENT OF STAPH INFECTIONS. Note: Gram Stain Report Called to,Read Back By and Verified With: ARMSTRONG  S [facility]) AT 0715 ON 08/21/2014 BY  RESSEGGER R Performed at [facility]    Report Status 08/23/2014 FINAL  Final   Organism ID, Bacteria STAPHYLOCOCCUS AUREUS  Final      Susceptibility   Staphylococcus aureus - MIC*    CLINDAMYCIN <=0.25 SENSITIVE Sensitive     ERYTHROMYCIN <=0.25 SENSITIVE Sensitive     GENTAMICIN <=0.5 SENSITIVE Sensitive     LEVOFLOXACIN >=8 RESISTANT Resistant     OXACILLIN 0.5 SENSITIVE Sensitive     PENICILLIN RESISTANT      RIFAMPIN <=0.5 SENSITIVE Sensitive     TRIMETH/SULFA <=10 SENSITIVE Sensitive     VANCOMYCIN <=0.5 SENSITIVE Sensitive     TETRACYCLINE <=1 SENSITIVE Sensitive     MOXIFLOXACIN 4 RESISTANT Resistant     * STAPHYLOCOCCUS AUREUS  MRSA PCR Screening     Status: Abnormal   Collection Time: 08/20/14  7:48 PM  Result Value Ref Range Status   MRSA by PCR POSITIVE (A) NEGATIVE Final    Comment:        The GeneXpert MRSA Assay (FDA approved for NASAL specimens only), is one component of a comprehensive MRSA colonization surveillance program. It is not intended to diagnose MRSA infection nor to guide or monitor treatment for MRSA infections. RESULT CALLED TO, READ BACK BY AND VERIFIED WITH: ARMSTRONG,S RN [phone] [id number] [place],N RESULT CALLED TO, READ BACK BY AND VERIFIED WITH: ARMSTRONG,S RN [phone] [id number] [place],N RESULT CALLED TO, READ BACK BY AND VERIFIED WITH: ARSTRONG,S RN [id number] C[place],N   Culture, routine-abscess     Status: None   Collection Time: 08/20/14 10:16 PM  Result Value Ref Range Status   Specimen Description ABSCESS  Final   Special Requests NONE  Final   Gram Stain   Final    MODERATE WBC PRESENT,BOTH PMN AND MONONUCLEAR NO SQUAMOUS EPITHELIAL CELLS SEEN MODERATE GRAM POSITIVE COCCI IN PAIRS IN CLUSTERS Performed at [facility]    Culture   Final    ABUNDANT STAPHYLOCOCCUS AUREUS Note: RIFAMPIN AND GENTAMICIN SHOULD NOT BE USED AS SINGLE DRUGS FOR TREATMENT OF STAPH INFECTIONS. Performed at [facility]     Report Status 08/23/2014 FINAL  Final   Organism ID, Bacteria STAPHYLOCOCCUS AUREUS  Final      Susceptibility   Staphylococcus aureus - MIC*    CLINDAMYCIN <=0.25 SENSITIVE Sensitive     ERYTHROMYCIN <=0.25 SENSITIVE Sensitive     GENTAMICIN <=0.5 SENSITIVE Sensitive     LEVOFLOXACIN >=8 RESISTANT Resistant     OXACILLIN 0.5 SENSITIVE Sensitive     PENICILLIN 0.12 SENSITIVE Sensitive     RIFAMPIN <=0.5 SENSITIVE Sensitive     TRIMETH/SULFA <=10 SENSITIVE Sensitive     VANCOMYCIN 1 SENSITIVE Sensitive     TETRACYCLINE <=1 SENSITIVE Sensitive     MOXIFLOXACIN 4 RESISTANT Resistant     * ABUNDANT STAPHYLOCOCCUS AUREUS  Anaerobic culture     Status: None   Collection Time: 08/20/14 10:16 PM  Result Value Ref Range Status   Specimen Description ABSCESS  Final   Special Requests NONE  Final   Gram Stain   Final    MODERATE WBC PRESENT,BOTH PMN AND MONONUCLEAR NO SQUAMOUS EPITHELIAL CELLS SEEN MODERATE GRAM POSITIVE COCCI IN PAIRS IN CLUSTERS Performed at [facility]    Culture   Final    NO ANAEROBES ISOLATED Performed at [facility]    Report Status 08/25/2014 FINAL  Final  Blood Cultures x 2 sites     Status:  None   Collection Time: 08/21/14  4:50 PM  Result Value Ref Range Status   Specimen Description BLOOD RIGHT HAND  Final   Special Requests BOTTLES DRAWN AEROBIC ONLY 10CC  Final   Culture   Final    NO GROWTH 5 DAYS Performed at [facility]    Report Status 08/27/2014 FINAL  Final  Blood Cultures x 2 sites     Status: None   Collection Time: 08/21/14  5:05 PM  Result Value Ref Range Status   Specimen Description BLOOD RIGHT ARM  Final   Special Requests BOTTLES DRAWN AEROBIC AND ANAEROBIC 10CC  Final   Culture   Final    NO GROWTH 5 DAYS Performed at [facility]    Report Status 08/27/2014 FINAL  Final    RADIOLOGY STUDIES/RESULTS: Dg Cervical Spine Complete  08/22/2014   CLINICAL DATA:  Rheumatoid arthritis, potential  planning for transesophageal echocardiography, history lupus  EXAM: CERVICAL SPINE  4+ VIEWS  COMPARISON:  None  FINDINGS: Straightening of cervical lordosis question muscle spasm.  Prevertebral soft tissues normal to upper normal in thickness.  Vertebral body and disc space heights maintained.  No acute fracture, subluxation or bone destruction.  Predental space normal.  Osseous neural foramina grossly patent.  Lung apices clear.  Facet alignments normal.  C1-C2 alignment normal.  IMPRESSION: No acute abnormalities.   Electronically Signed   By: Ulyses Southward M.D.   On: 08/22/2014 14:44   Ct Angio Chest Pe W/cm &/or Wo Cm  08/23/2014   CLINICAL DATA:  Chest pain question pulmonary embolism, chest pain began this morning on RIGHT side near breast, shortness of breath, cough, history lupus, rheumatoid arthritis  EXAM: CT ANGIOGRAPHY CHEST WITH CONTRAST  TECHNIQUE: Multidetector CT imaging of the chest was performed using the standard protocol during bolus administration of intravenous contrast. Multiplanar CT image reconstructions and MIPs were obtained to evaluate the vascular anatomy.  CONTRAST:  OMNIPAQUE IOHEXOL 350 MG/ML SOLN  COMPARISON:  None  FINDINGS: Aorta normal caliber without aneurysm or dissection.  Pulmonary arteries adequately opacified and patent.  No evidence of pulmonary embolism.  Visualized portion of liver and spleen normal appearance.  No thoracic adenopathy, though normal size lymph nodes are seen subcarinal and in both axilla.  Tiny BILATERAL pleural effusions with atelectasis in both lower lobes greater on LEFT.  6 mm diameter nodular density RIGHT upper lobe image 28.  6 mm nodular density LEFT lower lobe image 34.  Remaining lungs clear.  No pneumothorax or definite osseous findings.  Review of the MIP images confirms the above findings.  IMPRESSION: Small BILATERAL pleural effusions with subsegmental atelectasis in BILATERAL lower lobes.  No evidence pulmonary embolism.  BILATERAL 6  mm pulmonary nodules, recommendation below.  If the patient is at high risk for bronchogenic carcinoma, follow-up chest CT at 6-12 months is recommended. If the patient is at low risk for bronchogenic carcinoma, follow-up chest CT at 12 months is recommended. This recommendation follows the consensus statement: Guidelines for Management of Small Pulmonary Nodules Detected on CT Scans: A Statement from the Fleischner Society as published in Radiology 2005;237:395-400.   Electronically Signed   By: Ulyses Southward M.D.   On: 08/23/2014 16:01   Ct Abdomen W Contrast  08/27/2014   CLINICAL DATA:  54 year old female with a history of right psoas/ perinephric abscess and secondary proximal ureteral obstruction. She underwent placement of a percutaneous nephrostomy tube and retroperitoneal drainage catheter 08/20/2014. She is significantly clinically  improved and her right flank drainage catheter has no residual output. She appears to be having pain consistent was psoas muscle spasm related to the catheter position. Evaluate for residual psoas/perinephric abscess.  EXAM: CT ABDOMEN WITH CONTRAST  TECHNIQUE: Multidetector CT imaging of the abdomen was performed using the standard protocol following bolus administration of intravenous contrast.  CONTRAST:  80mL OMNIPAQUE IOHEXOL 300 MG/ML  SOLN  COMPARISON:  Prior CT abdomen and pelvis 08/20/2014  FINDINGS: Lower Chest: Small layering right pleural effusion with associated right lower lobe atelectasis. Otherwise, the lung bases are clear. Visualized cardiac structures within normal limits for size. No pericardial effusion. Unremarkable distal thoracic esophagus.  Abdomen: Unremarkable CT appearance of the stomach, duodenum, spleen, adrenal glands and pancreas. Normal hepatic contour and morphology. No discrete hepatic lesion. Mild low attenuation in the hepatic parenchyma relative to the spleen is nonspecific on a contrast-enhanced study but raises the possibility of hepatic  steatosis. Gallbladder is unremarkable. No intra or extrahepatic biliary ductal dilatation.  Total interval resolution of right-sided hydronephrosis. The right perinephric/ psoas drainage catheter remains in good position. The previously identified multiloculated rim enhancing fluid collection has completely resolved. No residual abscess collection remains. The ureter is well visualized. The left kidney remains unremarkable. The visualized bowel is unremarkable.  Bones/Soft Tissues: No acute fracture or aggressive appearing lytic or blastic osseous lesion.  Vascular: Trace atherosclerotic vascular calcifications.  IMPRESSION: 1. Complete resolution of right hydronephrosis and perinephric/psoas abscess. 2. Interval development of a small right layering pleural effusion with associated right lower lobe atelectasis.  PLAN: The right retroperitoneal abscess drainage catheter was removed.  Signed,  Sterling Big, MD  [facility]  [facility]   Electronically Signed   By: Malachy Moan M.D.   On: 08/27/2014 17:08   Mr Lumbar Spine W Wo Contrast  08/22/2014   CLINICAL DATA:  Retroperitoneal abscess. 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  EXAM: MRI LUMBAR SPINE WITHOUT AND WITH CONTRAST  TECHNIQUE: Multiplanar and multiecho pulse sequences of the lumbar spine were obtained without and with intravenous contrast.  CONTRAST:  15mL MULTIHANCE GADOBENATE DIMEGLUMINE 529 MG/ML IV SOLN  COMPARISON:  CT 08/20/2014.  FINDINGS: Segmentation: The numbering convention used for this exam termed L5-S1 as the last intervertebral disc space. Numbering was correlated with prior CT.  Alignment: Grade I anterolisthesis of L5 on S1 is a chronic finding and probably associated with old pars defects. Decompression with solid posterior lateral fusion.  Vertebrae: Benign hemangioma present at T12. Degenerative endplate changes are present at L1-L2. L4 inferior endplate edema  appears discogenic and shows mild post gadolinium enhancement. This is unlikely to represent discogenic infection given the degenerated disc and vacuum disc seen on prior CT 08/20/2014.  Conus medullaris: Normal termination at L1.  Paraspinal tissues: RIGHT nephrostomy and RIGHT sella is drains noted. No definite interval change compared to prior abdominal CT.  Disc levels:  Disc Signal: Disc degeneration at L4-L5. Partial ossification of the L5-S1 disc associated with posterior lateral fusion and posterior decompression.  L1-L2:  Negative.  L2-L3:  Facet hypertrophy without stenosis.  Normal disc.  L3-L4:  Mild facet degeneration.  No stenosis.  L4-L5: There is distraction of the LEFT facet joint with some fluid in the joint and enhancing granulation tissue or synovitis in the posterior aspect of the joint. The joint is degenerated and given the distraction, this is most compatible with ordinary degenerative changes and facet osteoarthritis. Infection of the facet joint is considered unlikely. There  is little if any periarticular edema around the LEFT L4-L5 facet joint. The RIGHT facet joint appears normal. An isolated septic facet arthritis even in the setting of psoas abscess is unlikely although there is imaging overlap between the appearance is of infection and degenerative disease.  Disc degeneration. Shallow broad-based posterior disc protrusion superimposed on disc bulging. Bilateral L4 laminotomies have been performed and central canal is decompressed. There is bilateral subarticular stenosis. LEFT foraminal stenosis is moderate, associated with degenerative disc and facet disease.  L5-S1: Partial ossification of the disc. Solid posterior lateral fusion and laminectomy with good decompression of the central canal.  IMPRESSION: 1. No convincing findings of spinal infection in this patient with psoas abscess and drainage. Mild inflammatory changes are present at the LEFT L4-L5 facet joint that are more  compatible with ordinary degenerative disease than septic arthritis. Although there is imaging overlap between the appearances, septic arthritis is unlikely. 2. Solid L5-S1 fusion and decompression. 3. 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   Electronically Signed   By: Andreas Newport M.D.   On: 08/22/2014 08:25   Ct Abdomen Pelvis W Contrast  08/20/2014   CLINICAL DATA:  Right upper quadrant abdominal pain for 1 week with nausea and vomiting. History of appendectomy, hernia repair and systemic lupus erythematosus. Moderate leukocytosis. Initial encounter.  EXAM: CT ABDOMEN AND PELVIS WITH CONTRAST  TECHNIQUE: Multidetector CT imaging of the abdomen and pelvis was performed using the standard protocol following bolus administration of intravenous contrast.  CONTRAST:  100 ml Omnipaque 300  COMPARISON:  Ultrasound same date.  FINDINGS: Minimal dependent atelectasis at both lung bases. Otherwise clear. No pleural or pericardial effusion.  Lower chest: Mildly decreased hepatic density suspicious for steatosis. No focal lesions identified.  Hepatobiliary: The liver is normal in density without focal abnormality. No evidence of gallstones, gallbladder wall thickening or biliary dilatation.  Pancreas: Unremarkable. No pancreatic ductal dilatation or surrounding inflammatory changes.  Spleen: Normal in size without focal abnormality.  Adrenals/Urinary Tract: Both adrenal glands appear normal.The left kidney appears normal. The right kidney demonstrates hydronephrosis and delayed contrast excretion. There is asymmetric perinephric soft tissue stranding on the right. There is a complex fluid collection or centrally necrotic mass adjacent to the ureteropelvic junction, measuring 5.6 x 3.2 cm transverse on image 46. This extends into the right psoas muscle. No urinary tract calculi demonstrated. The distal ureters and bladder appear  unremarkable.  Stomach/Bowel: No evidence of bowel wall thickening, distention or surrounding inflammatory change.Mild sigmoid colon diverticulosis without surrounding inflammation.  Vascular/Lymphatic: There are several mildly prominent retroperitoneal lymph nodes, likely reactive. No mesenteric adenopathy. Mild aortoiliac atherosclerosis. The IVC and renal veins appear patent.  Reproductive: The uterus appears unremarkable.  No adnexal mass.  Other: No evidence of abdominal wall mass or hernia.  Musculoskeletal: No acute or significant osseous findings. Mild lumbar spondylosis noted. There are postsurgical changes at the lumbosacral junction with a grade 1 anterolisthesis. Moderate facet hypertrophy noted at L4-5. The paraspinal fat planes are maintained. No evidence of discitis or osteomyelitis P  IMPRESSION: 1. Suspected abscess within the right retroperitoneum involving the psoas muscle and obstructing the right ureteral pelvic junction. This is an atypical location, and necrotic neoplasm cannot be completely excluded. No other cause for ureteral obstruction identified. 2. Small retroperitoneal lymph nodes are probably reactive. No other suspicious masses identified. 3. No evidence of urinary tract calculus. 4. Urology consultation recommended. Results were discussed by telephone  with Dr. Fayrene Fearing.   Electronically Signed   By: Carey Bullocks M.D.   On: 08/20/2014 14:38   Ir Nephrostogram Right Thru Existing Access  08/27/2014   CLINICAL DATA:  54 year old female with history of right psoas/perinephric abscess and secondary right hydronephrosis. Antegrade nephrostogram yesterday demonstrated renewed patency of the ureter. The nephrostomy tube was capped. Today, the patient has increased right flank pain. However, after conversing with the patient and appears this may represent psoas spasm related to the psoas drainage catheter.  Antegrade nephrostogram will be repeated to insure no new hydronephrosis or  recurrent ureteral obstruction. If the renal drainage remains brisk and antegrade, the percutaneous nephrostomy tube will be removed.  EXAM: IR NEPHROSTOGRAM EXISTING ACCESS RIGHT  Date: 08/27/2014  PROCEDURE: 1. Antegrade nephrostogram through existing access Interventional Radiologist:  Sterling Big, MD  ANESTHESIA/SEDATION: None required  MEDICATIONS: None  FLUOROSCOPY TIME:  6 seconds  2 mGy  CONTRAST:  10 mL administered into the right renal collecting system.  TECHNIQUE: Informed consent was obtained from the patient following explanation of the procedure, risks, benefits and alternatives. The patient understands, agrees and consents for the procedure. All questions were addressed. A time out was performed.  I hand injection of contrast material opacifies the renal collecting system. There is no evidence of hydronephrosis. There is brisk and prompt drainage through the ureter and into the bladder. After less than 2 minutes the kidney is completely drained.  The percutaneous nephrostomy tube was cut and removed.  COMPLICATIONS: None  IMPRESSION: 1. Normal right antegrade nephroureterogram. No evidence of ureteral stenosis, stricture or slow drainage. 2. Removal of right percutaneous nephrostomy tube.   Electronically Signed   By: Malachy Moan M.D.   On: 08/27/2014 17:33   Ir Nephrostogram Right Thru Existing Access  08/26/2014   CLINICAL DATA:  Right psoas retroperitoneal abscess resulting in right hydronephrosis. Status post right nephrostomy and right CT retroperitoneal abscess drain insertion.  EXAM: FLUOROSCOPIC ANTEGRADE NEPHROSTOGRAM THROUGH THE EXISTING RIGHT NEPHROSTOMY.  Date:  6/7/20166/09/2014 4:25 pm  Radiologist:  M. Ruel Favors, MD  Guidance:  FLUOROSCOPIC  FLUOROSCOPY TIME:  54 seconds  MEDICATIONS AND MEDICAL HISTORY: 3 mg Dilaudid  ANESTHESIA/SEDATION: None.  CONTRAST:  10mL OMNIPAQUE IOHEXOL 300 MG/ML  SOLN  COMPLICATIONS: None immediate  PROCEDURE: Informed consent was obtained  from the patient following explanation of the procedure, risks, benefits and alternatives. The patient understands, agrees and consents for the procedure. All questions were addressed. A time out was performed.  Maximal barrier sterile technique utilized including caps, mask, sterile gowns, sterile gloves, large sterile drape, hand hygiene, and ChloraPrep.  Under sterile conditions and local anesthesia, the existing right nephrostomy catheter was injected with contrast. Fluoroscopic imaging performed. This demonstrates patency of the right collecting system and ureter. The ureter appears normal without obstruction. Contrast drains easily into the bladder. No evidence of ureteral obstruction, stricture, or filling defect in the area of the existing abscess drain catheter.  IMPRESSION: Antegrade nephrostogram confirms resolution of hydronephrosis related to the retroperitoneal abscess. Right ureter is patent without obstruction or stricture and drains spontaneously into the bladder.  Findings discussed with Dr Sherron Monday. Because the right ureter is patent and normal appearing by nephrostogram ureteral stent may not be necessary. Right nephrostomy catheter will be capped externally overnight has a trial of internal drainage. If this is tolerated, the nephrostomy catheter will be removed tomorrow.   Electronically Signed   By: Judie Petit.  Shick M.D.   On: 08/26/2014 16:53  Ir Nephrostomy Placement Right  08/21/2014   CLINICAL DATA:  54 year old female with sepsis secondary to right psoas/ perinephric abscess. This abscess collection results in at least partial obstruction of the proximal ureter and right hydronephrosis. Additionally, there is clinical concern for possible pyonephrosis of the obstructed kidney. Therefore, percutaneous nephrostomy tube placement is warranted. This will then be followed by percutaneous drainage of the psoas/perinephric abscess.  EXAM: IR NEPHROSTOMY PLACEMENT RIGHT  Date: 08/21/2014  PROCEDURE:  1. Ultrasound-guided puncture right renal collecting system 2. Placement of a 10 French percutaneous nephrostomy tube using fluoroscopic guidance Interventional Radiologist:  Sterling Big, MD  ANESTHESIA/SEDATION: Moderate (conscious) sedation was used. 2 mg Versed, 100 mcg Fentanyl were administered intravenously. The patient's vital signs were monitored continuously by radiology nursing throughout the procedure.  Sedation Time: 25 minutes  MEDICATIONS: 2 g Ancef administered intravenously  FLUOROSCOPY TIME:  4 minutes 30 seconds  81 mGy  CONTRAST:  83mL OMNIPAQUE IOHEXOL 300 MG/ML  SOLN  TECHNIQUE: Informed consent was obtained from the patient following explanation of the procedure, risks, benefits and alternatives. The patient understands, agrees and consents for the procedure. All questions were addressed. A time out was performed.  Maximal barrier sterile technique utilized including caps, mask, sterile gowns, sterile gloves, large sterile drape, hand hygiene, and Betadine skin prep.  The right flank was interrogated with ultrasound. A suitable posterior inferior calyx was identified. Local anesthesia was attained by infiltration with 1% lidocaine. A small dermatotomy was made. Using real-time ultrasound guidance, the posterior inferior calyx was punctured with a 21 gauge Accustick needle. Free return of turbid urine from the needle once the stylet was removed confirmed its location within the collecting system. A gentle hand injection of contrast material opacified the selected lower pole calyx and infundibulum.  A night tracks wire was then advanced into the renal pelvis. The Accustick needle was exchanged for the Accustick sheath. A Bentson wire was then coiled in the renal pelvis and the Accustick sheath removed. The skin tract was dilated to 10 [place] and a [place] all-purpose drainage catheter was then advanced over the wire and formed with the locking loop in the renal pelvis. A final  image was obtained after confirming location within the renal pelvis by gentle hand injection of contrast.  The nephrostomy tube was then secured to the skin with 0 Prolene suture and a sterile bandage. The catheter was connected to gravity bag drainage. The patient tolerated the procedure well.  COMPLICATIONS: None  IMPRESSION: Technically successful placement of a 10 French right-sided percutaneous nephrostomy tube.  Signed,  Sterling Big, MD  [facility]  [facility]   Electronically Signed   By: Malachy Moan M.D.   On: 08/21/2014 16:52   Ct Image Guided Drainage By Percutaneous Catheter  08/21/2014   CLINICAL DATA:  54 year old female with a right retroperitoneal abscess and associated hydronephrosis. CT-guided drain placement is warranted in the setting of sepsis.  EXAM: CT IMAGE GUIDED DRAINAGE BY PERCUTANEOUS CATHETER  Date: 08/21/2014  PROCEDURE: 1. CT-guided drain placement in the right psoas muscle/perinephric space Interventional Radiologist:  Sterling Big, MD  ANESTHESIA/SEDATION: Moderate (conscious) sedation was used. 0.5 mg Versed, 25 mcg Fentanyl were administered intravenously. The patient's vital signs were monitored continuously by radiology nursing throughout the procedure.  Sedation Time: 15 minutes  MEDICATIONS: 2 g Ancef given within 1 hour of skin incision  TECHNIQUE: Informed consent was obtained from the patient following explanation of the procedure,  risks, benefits and alternatives. The patient understands, agrees and consents for the procedure. All questions were addressed. A time out was performed.  A planning axial CT scan was performed. The fluid collection within the right psoas musculature was successfully identified. A suitable skin entry site was selected and marked. The region was then sterilely prepped and draped in standard fashion using Betadine skin prep. Local anesthesia was attained by infiltration with 1%  lidocaine. A small dermatotomy was made. Under intermittent CT fluoroscopic guidance, an 18 gauge introducer needle was advanced into the fluid collection. An Amplatz wire was then coiled within the fluid collection in the skin tract dilated to 12 [place]. A Cook 12 [place] all-purpose drainage catheter was then advanced over the wire and formed within the fluid collection. Aspiration yields 20 mL of thick, frankly purulent material. A sample was sent for Gram stain and culture.  Axial CT imaging was then performed confirming good positioning of the drainage catheter. The fluid collection has notably decreased in size. The drainage tube was then secured to the skin with 0 Prolene suture and an adhesive fixation device. The tube was connected to JP bulb suction.  COMPLICATIONS: None  IMPRESSION: Successful placement of a 12 French percutaneous drainage catheter in the right psoas/perinephric fluid collection.  Aspiration yields 20 mL thick, frankly purulent material. Sample was sent for culture.  PLAN: Once drainage has been minimal (less than 20 mL) for several days, recommend repeat CT scan with contrast to ensure resolution of the abscess prior to tube removal.  Signed,  Sterling Big, MD  [facility]  [facility]   Electronically Signed   By: Malachy Moan M.D.   On: 08/21/2014 18:00   US Abdomen Limited Ruq  08/20/2014   CLINICAL DATA:  Sharp RIGHT upper quadrant pain for 1 week, history lupus, rheumatoid arthritis  EXAM: US ABDOMEN LIMITED - RIGHT UPPER QUADRANT  COMPARISON:  None  FINDINGS: Gallbladder:  Minimal dependent sludge in gallbladder. No shadowing gallstones, gallbladder wall thickening or pericholecystic fluid.  Common bile duct:  Diameter: Normal caliber 3 mm diameter  Liver:  Echogenic, likely fatty infiltration, though this can be seen with cirrhosis and certain infiltrative disorders. No focal hepatic mass or nodularity. Hepatopetal portal  venous flow.  No RIGHT upper quadrant free fluid.  IMPRESSION: Probable fatty infiltration of liver as above.  Minimal gallbladder sludge.  Otherwise negative exam.   Electronically Signed   By: Ulyses Southward M.D.   On: 08/20/2014 13:04    Houda Brau,MD  [facility] Pager:[phone]  If 7PM-7AM, please contact night-coverage [email or web address] Password [email or web address] 08/28/2014, 6:54 PM   LOS: 8 days

## 2014-08-28 NOTE — Progress Notes (Signed)
Peripherally Inserted Central Catheter/Midline Placement  The IV Nurse has discussed with the patient and/or persons authorized to consent for the patient, the purpose of this procedure and the potential benefits and risks involved with this procedure.  The benefits include less needle sticks, lab draws from the catheter and patient may be discharged home with the catheter.  Risks include, but not limited to, infection, bleeding, blood clot (thrombus formation), and puncture of an artery; nerve damage and irregular heat beat.  Alternatives to this procedure were also discussed.  PICC/Midline Placement Documentation        Lisabeth Devoid 08/28/2014, 8:19 AM Consent obtained by Merleen Milliner, RN,CRNI

## 2014-08-28 NOTE — Progress Notes (Signed)
Chaplain visited patient yesterday and today as well. Chaplain allowed patient to cry on her shoulder as she was very emotionally sharing and seeking redemption. Chaplain prayed with patient a prayer of comfort and provided literature for patient. Chaplain will follow up.

## 2014-08-29 DIAGNOSIS — M069 Rheumatoid arthritis, unspecified: Secondary | ICD-10-CM

## 2014-08-29 LAB — MAGNESIUM: MAGNESIUM: 2.2 mg/dL (ref 1.7–2.4)

## 2014-08-29 MED ORDER — THIAMINE HCL 100 MG PO TABS: 100.0000 mg | ORAL_TABLET | Freq: Every day | ORAL | Status: DC

## 2014-08-29 MED ORDER — HEPARIN SOD (PORK) LOCK FLUSH 100 UNIT/ML IV SOLN: 250.0000 [IU] | INTRAVENOUS | Status: DC | PRN

## 2014-08-29 MED ORDER — CEFAZOLIN SODIUM-DEXTROSE 2-3 GM-% IV SOLR: 2.0000 g | Freq: Three times a day (TID) | INTRAVENOUS | Status: AC

## 2014-08-29 MED ORDER — SENNA 8.6 MG PO TABS: 2.0000 | ORAL_TABLET | Freq: Every day | ORAL | Status: AC

## 2014-08-29 MED ORDER — ALPRAZOLAM 1 MG PO TABS: 1.0000 mg | ORAL_TABLET | Freq: Two times a day (BID) | ORAL | Status: AC

## 2014-08-29 MED ORDER — POLYETHYLENE GLYCOL 3350 17 G PO PACK: 17.0000 g | PACK | Freq: Every day | ORAL | Status: DC

## 2014-08-29 MED ORDER — FOLIC ACID 1 MG PO TABS: 1.0000 mg | ORAL_TABLET | Freq: Every day | ORAL | Status: DC

## 2014-08-29 MED ORDER — OXYCODONE HCL 5 MG PO TABS: 5.0000 mg | ORAL_TABLET | ORAL | Status: DC | PRN

## 2014-08-29 NOTE — Discharge Summary (Signed)
Physician Discharge Summary  Deanna Ball ZOX:[id number] DOB: 01-20-1961 DOA: 08/20/2014  PCP: No primary care provider on file.  Admit date: 08/20/2014 Discharge date: 08/29/2014  Time spent: 30 minutes  Recommendations for Outpatient Follow-up:  1. Recommend weekly CBC with BMET  And fax to Dr Zenaida Niece Dam's office at [phone].  2. Please continue the IV cefazolin till 10/15/2014 3. Follow up with Dr Daiva Eves as recommended.  4. Please follow up with PCP in 2 weeks.  5. Please follow up with Urology as recommended.   Discharge Diagnoses:  Principal Problem:   Perinephric abscess Active Problems:   Pyelonephritis   Hydronephrosis   Lupus   Rheumatoid arthritis   Anxiety   GERD without esophagitis   Alcohol abuse   Transaminitis   Abscess   Ureteral obstruction   Screening for STD (sexually transmitted disease)   Psoas abscess   Staphylococcus aureus bacteremia with sepsis   Right-sided chest pain   Discharge Condition: improved.   Diet recommendation: regular  Filed Weights   08/20/14 1030 08/20/14 1824  Weight: 70.308 kg (155 lb) 71.3 kg (157 lb 3 oz)    History of present illness:  54 yo female with hx of SLE on chronic prednisone/MTX admitted with worsening right sided flank pain. Found to have right perinephric abscess causing right sided hydronephroses and MSSA bacteremia. TEE negative. Has a percutaneous drain in the right perinephric abscess in place, nephrostomy tube remains in place. IR and urology consulted. Infectious disease recommends IV Ancef for a total of 8 weeks with last day being on 10/15/14.   Hospital Course:  Abscess right retroperitoneum/perinephric abscess involving the psoas muscle and obstructing the right ureteral pelvic junction causing hydronephrosis: Underwent placement of right percutaneous nephrostomy tube and percutaneous drain into the right psoas/perinephric abscess by interventional radiology on 6/1. Abscess culture, blood culture, urine  culture positive for MSSA.  Repeat CT showed complete resolution of the right hydronephrosis and perinephric psoas abscess. And the right retroperitoneal abscess drainage catheter was removed. She underwent antegrade nephrostogram and was found to have normal right antegrade nephro ureterogram. There is no evidence of ureteral stenosis, stricture or slow drainage.  Outpatient follow up with urology as recommended.   Sepsis: Secondary to above and MSSA bacteremia. Sepsis pathophysiology resolved. Cultures and antibiotics as above.  MSSA bacteremia: Blood culture along with urine and abscess culture positive for MSSA. TTE and TEE. negative for vegetations. Seen by infectious disease, recommendations are a total of 8 weeks of IV Ancef, with last day being 10/15/14. Patient will need weekly CBC with BMET faxed to Dr Clinton Gallant office at [phone]. Since repeat blood cultures on 6/2 continue to be negative -PICC Line  placed.  Right-sided chest pain:Pleuritic-CT Chest negative for PE. Follow.Supportive care-  Resolved.  Anemia: Likely secondary to acute illness. Follow CBCs periodically.  Hypokalemia: Repleted  History of lupus: Continue prednisone and  methotrexate.  History of alcohol abuse: Drinks 2 bottles of wine daily. No signs of withdrawal currently-more than 7 days out from her last drink.   GERD: PPI.  Anxiety: Continue as needed Xanax  Procedures: Perc Nephrostomy/Abscess drain 6/1 right retroperitoneal abscess drainage catheter was removed 6/8  Consultations:  IR  UROLOGY  Discharge Exam: Filed Vitals:   08/29/14 0445  BP: 136/55  Pulse: 68  Temp: 97.7 F (36.5 C)  Resp: 18    General: alert afebrile comfortable Cardiovascular: s1s2 Respiratory: ctab  Discharge Instructions    Current Discharge Medication List  START taking these medications   Details  ceFAZolin (ANCEF) 2-3 GM-% SOLR Inject 50 mLs (2 g total) into the vein every 8 (eight) hours.     folic acid (FOLVITE) 1 MG tablet Take 1 tablet (1 mg total) by mouth daily.    oxyCODONE (OXY IR/ROXICODONE) 5 MG immediate release tablet Take 1-2 tablets (5-10 mg total) by mouth every 4 (four) hours as needed for moderate pain. Qty: 15 tablet, Refills: 0    polyethylene glycol (MIRALAX / GLYCOLAX) packet Take 17 g by mouth daily. Qty: 14 each, Refills: 0    senna (SENOKOT) 8.6 MG TABS tablet Take 2 tablets (17.2 mg total) by mouth at bedtime. Qty: 120 each, Refills: 0    thiamine 100 MG tablet Take 1 tablet (100 mg total) by mouth daily.      CONTINUE these medications which have CHANGED   Details  ALPRAZolam (XANAX) 1 MG tablet Take 1 tablet (1 mg total) by mouth 2 (two) times daily. Qty: 15 tablet, Refills: 0      CONTINUE these medications which have NOT CHANGED   Details  methotrexate (RHEUMATREX) 2.5 MG tablet Take 25 mg by mouth 2 (two) times a week. Caution:Chemotherapy. Protect from light.    NEXIUM 40 MG capsule     predniSONE (DELTASONE) 2.5 MG tablet Take 2.5 mg by mouth 2 (two) times daily with a meal.    Vitamin D, Ergocalciferol, (DRISDOL) 50000 UNITS CAPS capsule Take 50,000 Units by mouth every 7 (seven) days.      STOP taking these medications     HYDROcodone-acetaminophen (NORCO/VICODIN) 5-325 MG per tablet      naproxen sodium (ANAPROX) 220 MG tablet        No Known Allergies    The results of significant diagnostics from this hospitalization (including imaging, microbiology, ancillary and laboratory) are listed below for reference.    Significant Diagnostic Studies: Dg Cervical Spine Complete  08/22/2014   CLINICAL DATA:  Rheumatoid arthritis, potential planning for transesophageal echocardiography, history lupus  EXAM: CERVICAL SPINE  4+ VIEWS  COMPARISON:  None  FINDINGS: Straightening of cervical lordosis question muscle spasm.  Prevertebral soft tissues normal to upper normal in thickness.  Vertebral body and disc space heights maintained.   No acute fracture, subluxation or bone destruction.  Predental space normal.  Osseous neural foramina grossly patent.  Lung apices clear.  Facet alignments normal.  C1-C2 alignment normal.  IMPRESSION: No acute abnormalities.   Electronically Signed   By: Ulyses Southward M.D.   On: 08/22/2014 14:44   Ct Angio Chest Pe W/cm &/or Wo Cm  08/23/2014   CLINICAL DATA:  Chest pain question pulmonary embolism, chest pain began this morning on RIGHT side near breast, shortness of breath, cough, history lupus, rheumatoid arthritis  EXAM: CT ANGIOGRAPHY CHEST WITH CONTRAST  TECHNIQUE: Multidetector CT imaging of the chest was performed using the standard protocol during bolus administration of intravenous contrast. Multiplanar CT image reconstructions and MIPs were obtained to evaluate the vascular anatomy.  CONTRAST:  OMNIPAQUE IOHEXOL 350 MG/ML SOLN  COMPARISON:  None  FINDINGS: Aorta normal caliber without aneurysm or dissection.  Pulmonary arteries adequately opacified and patent.  No evidence of pulmonary embolism.  Visualized portion of liver and spleen normal appearance.  No thoracic adenopathy, though normal size lymph nodes are seen subcarinal and in both axilla.  Tiny BILATERAL pleural effusions with atelectasis in both lower lobes greater on LEFT.  6 mm diameter nodular density RIGHT upper lobe image  28.  6 mm nodular density LEFT lower lobe image 34.  Remaining lungs clear.  No pneumothorax or definite osseous findings.  Review of the MIP images confirms the above findings.  IMPRESSION: Small BILATERAL pleural effusions with subsegmental atelectasis in BILATERAL lower lobes.  No evidence pulmonary embolism.  BILATERAL 6 mm pulmonary nodules, recommendation below.  If the patient is at high risk for bronchogenic carcinoma, follow-up chest CT at 6-12 months is recommended. If the patient is at low risk for bronchogenic carcinoma, follow-up chest CT at 12 months is recommended. This recommendation follows the  consensus statement: Guidelines for Management of Small Pulmonary Nodules Detected on CT Scans: A Statement from the Fleischner Society as published in Radiology 2005;237:395-400.   Electronically Signed   By: Ulyses Southward M.D.   On: 08/23/2014 16:01   Ct Abdomen W Contrast  08/27/2014   CLINICAL DATA:  54 year old female with a history of right psoas/ perinephric abscess and secondary proximal ureteral obstruction. She underwent placement of a percutaneous nephrostomy tube and retroperitoneal drainage catheter 08/20/2014. She is significantly clinically improved and her right flank drainage catheter has no residual output. She appears to be having pain consistent was psoas muscle spasm related to the catheter position. Evaluate for residual psoas/perinephric abscess.  EXAM: CT ABDOMEN WITH CONTRAST  TECHNIQUE: Multidetector CT imaging of the abdomen was performed using the standard protocol following bolus administration of intravenous contrast.  CONTRAST:  80mL OMNIPAQUE IOHEXOL 300 MG/ML  SOLN  COMPARISON:  Prior CT abdomen and pelvis 08/20/2014  FINDINGS: Lower Chest: Small layering right pleural effusion with associated right lower lobe atelectasis. Otherwise, the lung bases are clear. Visualized cardiac structures within normal limits for size. No pericardial effusion. Unremarkable distal thoracic esophagus.  Abdomen: Unremarkable CT appearance of the stomach, duodenum, spleen, adrenal glands and pancreas. Normal hepatic contour and morphology. No discrete hepatic lesion. Mild low attenuation in the hepatic parenchyma relative to the spleen is nonspecific on a contrast-enhanced study but raises the possibility of hepatic steatosis. Gallbladder is unremarkable. No intra or extrahepatic biliary ductal dilatation.  Total interval resolution of right-sided hydronephrosis. The right perinephric/ psoas drainage catheter remains in good position. The previously identified multiloculated rim enhancing fluid  collection has completely resolved. No residual abscess collection remains. The ureter is well visualized. The left kidney remains unremarkable. The visualized bowel is unremarkable.  Bones/Soft Tissues: No acute fracture or aggressive appearing lytic or blastic osseous lesion.  Vascular: Trace atherosclerotic vascular calcifications.  IMPRESSION: 1. Complete resolution of right hydronephrosis and perinephric/psoas abscess. 2. Interval development of a small right layering pleural effusion with associated right lower lobe atelectasis.  PLAN: The right retroperitoneal abscess drainage catheter was removed.  Signed,  Sterling Big, MD  [facility]  [facility]   Electronically Signed   By: Malachy Moan M.D.   On: 08/27/2014 17:08   Mr Lumbar Spine W Wo Contrast  08/22/2014   CLINICAL DATA:  Retroperitoneal abscess. 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  EXAM: MRI LUMBAR SPINE WITHOUT AND WITH CONTRAST  TECHNIQUE: Multiplanar and multiecho pulse sequences of the lumbar spine were obtained without and with intravenous contrast.  CONTRAST:  15mL MULTIHANCE GADOBENATE DIMEGLUMINE 529 MG/ML IV SOLN  COMPARISON:  CT 08/20/2014.  FINDINGS: Segmentation: The numbering convention used for this exam termed L5-S1 as the last intervertebral disc space. Numbering was correlated with prior CT.  Alignment: Grade I anterolisthesis of L5 on S1 is a chronic finding and probably associated with old  pars defects. Decompression with solid posterior lateral fusion.  Vertebrae: Benign hemangioma present at T12. Degenerative endplate changes are present at L1-L2. L4 inferior endplate edema appears discogenic and shows mild post gadolinium enhancement. This is unlikely to represent discogenic infection given the degenerated disc and vacuum disc seen on prior CT 08/20/2014.  Conus medullaris: Normal termination at L1.  Paraspinal tissues: RIGHT nephrostomy and RIGHT  sella is drains noted. No definite interval change compared to prior abdominal CT.  Disc levels:  Disc Signal: Disc degeneration at L4-L5. Partial ossification of the L5-S1 disc associated with posterior lateral fusion and posterior decompression.  L1-L2:  Negative.  L2-L3:  Facet hypertrophy without stenosis.  Normal disc.  L3-L4:  Mild facet degeneration.  No stenosis.  L4-L5: There is distraction of the LEFT facet joint with some fluid in the joint and enhancing granulation tissue or synovitis in the posterior aspect of the joint. The joint is degenerated and given the distraction, this is most compatible with ordinary degenerative changes and facet osteoarthritis. Infection of the facet joint is considered unlikely. There is little if any periarticular edema around the LEFT L4-L5 facet joint. The RIGHT facet joint appears normal. An isolated septic facet arthritis even in the setting of psoas abscess is unlikely although there is imaging overlap between the appearance is of infection and degenerative disease.  Disc degeneration. Shallow broad-based posterior disc protrusion superimposed on disc bulging. Bilateral L4 laminotomies have been performed and central canal is decompressed. There is bilateral subarticular stenosis. LEFT foraminal stenosis is moderate, associated with degenerative disc and facet disease.  L5-S1: Partial ossification of the disc. Solid posterior lateral fusion and laminectomy with good decompression of the central canal.  IMPRESSION: 1. No convincing findings of spinal infection in this patient with psoas abscess and drainage. Mild inflammatory changes are present at the LEFT L4-L5 facet joint that are more compatible with ordinary degenerative disease than septic arthritis. Although there is imaging overlap between the appearances, septic arthritis is unlikely. 2. Solid L5-S1 fusion and decompression. 3. L4-L5 adjacent segment disease with disc degeneration and bilateral subarticular  stenosis with moderate LEFT foraminal stenosis.   Electronically Signed   By: Andreas Newport M.D.   On: 08/22/2014 08:25   Ct Abdomen Pelvis W Contrast  08/20/2014   CLINICAL DATA:  Right upper quadrant abdominal pain for 1 week with nausea and vomiting. History of appendectomy, hernia repair and systemic lupus erythematosus. Moderate leukocytosis. Initial encounter.  EXAM: CT ABDOMEN AND PELVIS WITH CONTRAST  TECHNIQUE: Multidetector CT imaging of the abdomen and pelvis was performed using the standard protocol following bolus administration of intravenous contrast.  CONTRAST:  100 ml Omnipaque 300  COMPARISON:  Ultrasound same date.  FINDINGS: Minimal dependent atelectasis at both lung bases. Otherwise clear. No pleural or pericardial effusion.  Lower chest: Mildly decreased hepatic density suspicious for steatosis. No focal lesions identified.  Hepatobiliary: The liver is normal in density without focal abnormality. No evidence of gallstones, gallbladder wall thickening or biliary dilatation.  Pancreas: Unremarkable. No pancreatic ductal dilatation or surrounding inflammatory changes.  Spleen: Normal in size without focal abnormality.  Adrenals/Urinary Tract: Both adrenal glands appear normal.The left kidney appears normal. The right kidney demonstrates hydronephrosis and delayed contrast excretion. There is asymmetric perinephric soft tissue stranding on the right. There is a complex fluid collection or centrally necrotic mass adjacent to the ureteropelvic junction, measuring 5.6 x 3.2 cm transverse on image 46. This extends into the right psoas muscle. No urinary tract calculi  demonstrated. The distal ureters and bladder appear unremarkable.  Stomach/Bowel: No evidence of bowel wall thickening, distention or surrounding inflammatory change.Mild sigmoid colon diverticulosis without surrounding inflammation.  Vascular/Lymphatic: There are several mildly prominent retroperitoneal lymph nodes, likely reactive.  No mesenteric adenopathy. Mild aortoiliac atherosclerosis. The IVC and renal veins appear patent.  Reproductive: The uterus appears unremarkable.  No adnexal mass.  Other: No evidence of abdominal wall mass or hernia.  Musculoskeletal: No acute or significant osseous findings. Mild lumbar spondylosis noted. There are postsurgical changes at the lumbosacral junction with a grade 1 anterolisthesis. Moderate facet hypertrophy noted at L4-5. The paraspinal fat planes are maintained. No evidence of discitis or osteomyelitis P  IMPRESSION: 1. Suspected abscess within the right retroperitoneum involving the psoas muscle and obstructing the right ureteral pelvic junction. This is an atypical location, and necrotic neoplasm cannot be completely excluded. No other cause for ureteral obstruction identified. 2. Small retroperitoneal lymph nodes are probably reactive. No other suspicious masses identified. 3. No evidence of urinary tract calculus. 4. Urology consultation recommended. Results were discussed by telephone with Dr. Fayrene Fearing.   Electronically Signed   By: Carey Bullocks M.D.   On: 08/20/2014 14:38   Ir Nephrostogram Right Thru Existing Access  08/27/2014   CLINICAL DATA:  54 year old female with history of right psoas/perinephric abscess and secondary right hydronephrosis. Antegrade nephrostogram yesterday demonstrated renewed patency of the ureter. The nephrostomy tube was capped. Today, the patient has increased right flank pain. However, after conversing with the patient and appears this may represent psoas spasm related to the psoas drainage catheter.  Antegrade nephrostogram will be repeated to insure no new hydronephrosis or recurrent ureteral obstruction. If the renal drainage remains brisk and antegrade, the percutaneous nephrostomy tube will be removed.  EXAM: IR NEPHROSTOGRAM EXISTING ACCESS RIGHT  Date: 08/27/2014  PROCEDURE: 1. Antegrade nephrostogram through existing access Interventional Radiologist:   Sterling Big, MD  ANESTHESIA/SEDATION: None required  MEDICATIONS: None  FLUOROSCOPY TIME:  6 seconds  2 mGy  CONTRAST:  10 mL administered into the right renal collecting system.  TECHNIQUE: Informed consent was obtained from the patient following explanation of the procedure, risks, benefits and alternatives. The patient understands, agrees and consents for the procedure. All questions were addressed. A time out was performed.  I hand injection of contrast material opacifies the renal collecting system. There is no evidence of hydronephrosis. There is brisk and prompt drainage through the ureter and into the bladder. After less than 2 minutes the kidney is completely drained.  The percutaneous nephrostomy tube was cut and removed.  COMPLICATIONS: None  IMPRESSION: 1. Normal right antegrade nephroureterogram. No evidence of ureteral stenosis, stricture or slow drainage. 2. Removal of right percutaneous nephrostomy tube.   Electronically Signed   By: Malachy Moan M.D.   On: 08/27/2014 17:33   Ir Nephrostogram Right Thru Existing Access  08/26/2014   CLINICAL DATA:  Right psoas retroperitoneal abscess resulting in right hydronephrosis. Status post right nephrostomy and right CT retroperitoneal abscess drain insertion.  EXAM: FLUOROSCOPIC ANTEGRADE NEPHROSTOGRAM THROUGH THE EXISTING RIGHT NEPHROSTOMY.  Date:  6/7/20166/09/2014 4:25 pm  Radiologist:  M. Ruel Favors, MD  Guidance:  FLUOROSCOPIC  FLUOROSCOPY TIME:  54 seconds  MEDICATIONS AND MEDICAL HISTORY: 3 mg Dilaudid  ANESTHESIA/SEDATION: None.  CONTRAST:  10mL OMNIPAQUE IOHEXOL 300 MG/ML  SOLN  COMPLICATIONS: None immediate  PROCEDURE: Informed consent was obtained from the patient following explanation of the procedure, risks, benefits and alternatives. The patient understands, agrees and consents  for the procedure. All questions were addressed. A time out was performed.  Maximal barrier sterile technique utilized including caps, mask, sterile  gowns, sterile gloves, large sterile drape, hand hygiene, and ChloraPrep.  Under sterile conditions and local anesthesia, the existing right nephrostomy catheter was injected with contrast. Fluoroscopic imaging performed. This demonstrates patency of the right collecting system and ureter. The ureter appears normal without obstruction. Contrast drains easily into the bladder. No evidence of ureteral obstruction, stricture, or filling defect in the area of the existing abscess drain catheter.  IMPRESSION: Antegrade nephrostogram confirms resolution of hydronephrosis related to the retroperitoneal abscess. Right ureter is patent without obstruction or stricture and drains spontaneously into the bladder.  Findings discussed with Dr Sherron Monday. Because the right ureter is patent and normal appearing by nephrostogram ureteral stent may not be necessary. Right nephrostomy catheter will be capped externally overnight has a trial of internal drainage. If this is tolerated, the nephrostomy catheter will be removed tomorrow.   Electronically Signed   By: Judie Petit.  Shick M.D.   On: 08/26/2014 16:53   Ir Nephrostomy Placement Right  08/21/2014   CLINICAL DATA:  54 year old female with sepsis secondary to right psoas/ perinephric abscess. This abscess collection results in at least partial obstruction of the proximal ureter and right hydronephrosis. Additionally, there is clinical concern for possible pyonephrosis of the obstructed kidney. Therefore, percutaneous nephrostomy tube placement is warranted. This will then be followed by percutaneous drainage of the psoas/perinephric abscess.  EXAM: IR NEPHROSTOMY PLACEMENT RIGHT  Date: 08/21/2014  PROCEDURE: 1. Ultrasound-guided puncture right renal collecting system 2. Placement of a 10 French percutaneous nephrostomy tube using fluoroscopic guidance Interventional Radiologist:  Sterling Big, MD  ANESTHESIA/SEDATION: Moderate (conscious) sedation was used. 2 mg Versed, 100 mcg  Fentanyl were administered intravenously. The patient's vital signs were monitored continuously by radiology nursing throughout the procedure.  Sedation Time: 25 minutes  MEDICATIONS: 2 g Ancef administered intravenously  FLUOROSCOPY TIME:  4 minutes 30 seconds  81 mGy  CONTRAST:  57mL OMNIPAQUE IOHEXOL 300 MG/ML  SOLN  TECHNIQUE: Informed consent was obtained from the patient following explanation of the procedure, risks, benefits and alternatives. The patient understands, agrees and consents for the procedure. All questions were addressed. A time out was performed.  Maximal barrier sterile technique utilized including caps, mask, sterile gowns, sterile gloves, large sterile drape, hand hygiene, and Betadine skin prep.  The right flank was interrogated with ultrasound. A suitable posterior inferior calyx was identified. Local anesthesia was attained by infiltration with 1% lidocaine. A small dermatotomy was made. Using real-time ultrasound guidance, the posterior inferior calyx was punctured with a 21 gauge Accustick needle. Free return of turbid urine from the needle once the stylet was removed confirmed its location within the collecting system. A gentle hand injection of contrast material opacified the selected lower pole calyx and infundibulum.  A night tracks wire was then advanced into the renal pelvis. The Accustick needle was exchanged for the Accustick sheath. A Bentson wire was then coiled in the renal pelvis and the Accustick sheath removed. The skin tract was dilated to 10 [place] and a [place] all-purpose drainage catheter was then advanced over the wire and formed with the locking loop in the renal pelvis. A final image was obtained after confirming location within the renal pelvis by gentle hand injection of contrast.  The nephrostomy tube was then secured to the skin with 0 Prolene suture and a sterile bandage. The catheter was connected to  gravity bag drainage. The patient tolerated the  procedure well.  COMPLICATIONS: None  IMPRESSION: Technically successful placement of a 10 French right-sided percutaneous nephrostomy tube.  Signed,  Sterling Big, MD  [facility]  [facility]   Electronically Signed   By: Malachy Moan M.D.   On: 08/21/2014 16:52   Ct Image Guided Drainage By Percutaneous Catheter  08/21/2014   CLINICAL DATA:  54 year old female with a right retroperitoneal abscess and associated hydronephrosis. CT-guided drain placement is warranted in the setting of sepsis.  EXAM: CT IMAGE GUIDED DRAINAGE BY PERCUTANEOUS CATHETER  Date: 08/21/2014  PROCEDURE: 1. CT-guided drain placement in the right psoas muscle/perinephric space Interventional Radiologist:  Sterling Big, MD  ANESTHESIA/SEDATION: Moderate (conscious) sedation was used. 0.5 mg Versed, 25 mcg Fentanyl were administered intravenously. The patient's vital signs were monitored continuously by radiology nursing throughout the procedure.  Sedation Time: 15 minutes  MEDICATIONS: 2 g Ancef given within 1 hour of skin incision  TECHNIQUE: Informed consent was obtained from the patient following explanation of the procedure, risks, benefits and alternatives. The patient understands, agrees and consents for the procedure. All questions were addressed. A time out was performed.  A planning axial CT scan was performed. The fluid collection within the right psoas musculature was successfully identified. A suitable skin entry site was selected and marked. The region was then sterilely prepped and draped in standard fashion using Betadine skin prep. Local anesthesia was attained by infiltration with 1% lidocaine. A small dermatotomy was made. Under intermittent CT fluoroscopic guidance, an 18 gauge introducer needle was advanced into the fluid collection. An Amplatz wire was then coiled within the fluid collection in the skin tract dilated to 12 [place]. A Cook 12 [place]  all-purpose drainage catheter was then advanced over the wire and formed within the fluid collection. Aspiration yields 20 mL of thick, frankly purulent material. A sample was sent for Gram stain and culture.  Axial CT imaging was then performed confirming good positioning of the drainage catheter. The fluid collection has notably decreased in size. The drainage tube was then secured to the skin with 0 Prolene suture and an adhesive fixation device. The tube was connected to JP bulb suction.  COMPLICATIONS: None  IMPRESSION: Successful placement of a 12 French percutaneous drainage catheter in the right psoas/perinephric fluid collection.  Aspiration yields 20 mL thick, frankly purulent material. Sample was sent for culture.  PLAN: Once drainage has been minimal (less than 20 mL) for several days, recommend repeat CT scan with contrast to ensure resolution of the abscess prior to tube removal.  Signed,  Sterling Big, MD  [facility]  [facility]   Electronically Signed   By: Malachy Moan M.D.   On: 08/21/2014 18:00   US Abdomen Limited Ruq  08/20/2014   CLINICAL DATA:  Sharp RIGHT upper quadrant pain for 1 week, history lupus, rheumatoid arthritis  EXAM: US ABDOMEN LIMITED - RIGHT UPPER QUADRANT  COMPARISON:  None  FINDINGS: Gallbladder:  Minimal dependent sludge in gallbladder. No shadowing gallstones, gallbladder wall thickening or pericholecystic fluid.  Common bile duct:  Diameter: Normal caliber 3 mm diameter  Liver:  Echogenic, likely fatty infiltration, though this can be seen with cirrhosis and certain infiltrative disorders. No focal hepatic mass or nodularity. Hepatopetal portal venous flow.  No RIGHT upper quadrant free fluid.  IMPRESSION: Probable fatty infiltration of liver as above.  Minimal gallbladder sludge.  Otherwise negative exam.  Electronically Signed   By: Ulyses Southward M.D.   On: 08/20/2014 13:04    Microbiology: Recent Results  (from the past 240 hour(s))  Urine culture     Status: None   Collection Time: 08/20/14  1:15 PM  Result Value Ref Range Status   Specimen Description URINE, CLEAN CATCH  Final   Special Requests NONE  Final   Colony Count   Final    >=100,000 COLONIES/ML Performed at [facility]    Culture   Final    STAPHYLOCOCCUS AUREUS Note: RIFAMPIN AND GENTAMICIN SHOULD NOT BE USED AS SINGLE DRUGS FOR TREATMENT OF STAPH INFECTIONS. Performed at [facility]    Report Status 08/22/2014 FINAL  Final   Organism ID, Bacteria STAPHYLOCOCCUS AUREUS  Final      Susceptibility   Staphylococcus aureus - MIC*    GENTAMICIN <=0.5 SENSITIVE Sensitive     LEVOFLOXACIN >=8 RESISTANT Resistant     NITROFURANTOIN <=16 SENSITIVE Sensitive     OXACILLIN 0.5 SENSITIVE Sensitive     PENICILLIN RESISTANT      RIFAMPIN <=0.5 SENSITIVE Sensitive     TRIMETH/SULFA <=10 SENSITIVE Sensitive     VANCOMYCIN 1 SENSITIVE Sensitive     TETRACYCLINE <=1 SENSITIVE Sensitive     * STAPHYLOCOCCUS AUREUS  Culture, blood (routine x 2)     Status: None   Collection Time: 08/20/14  3:24 PM  Result Value Ref Range Status   Specimen Description BLOOD RIGHT ANTECUBITAL  Final   Special Requests BOTTLES DRAWN AEROBIC AND ANAEROBIC 8CC  Final   Culture   Final    STAPHYLOCOCCUS AUREUS Note: RIFAMPIN AND GENTAMICIN SHOULD NOT BE USED AS SINGLE DRUGS FOR TREATMENT OF STAPH INFECTIONS. SUSCEPTIBILITIES PERFORMED ON PREVIOUS CULTURE WITHIN THE LAST 5 DAYS. Note: Gram Stain Report Called to,Read Back By and Verified With:  ARMSTRONG S @ 0715 08/21/2014 BY RESSEGGER RESISTANT Performed at [facility]    Report Status 08/23/2014 FINAL  Final  Culture, blood (routine x 2)     Status: None   Collection Time: 08/20/14  3:33 PM  Result Value Ref Range Status   Specimen Description BLOOD RIGHT HAND  Final   Special Requests BOTTLES DRAWN AEROBIC ONLY 4CC  Final   Culture   Final    STAPHYLOCOCCUS AUREUS Note:  RIFAMPIN AND GENTAMICIN SHOULD NOT BE USED AS SINGLE DRUGS FOR TREATMENT OF STAPH INFECTIONS. Note: Gram Stain Report Called to,Read Back By and Verified With: ARMSTRONG S [facility]) AT 0715 ON 08/21/2014 BY RESSEGGER R Performed at [facility]    Report Status 08/23/2014 FINAL  Final   Organism ID, Bacteria STAPHYLOCOCCUS AUREUS  Final      Susceptibility   Staphylococcus aureus - MIC*    CLINDAMYCIN <=0.25 SENSITIVE Sensitive     ERYTHROMYCIN <=0.25 SENSITIVE Sensitive     GENTAMICIN <=0.5 SENSITIVE Sensitive     LEVOFLOXACIN >=8 RESISTANT Resistant     OXACILLIN 0.5 SENSITIVE Sensitive     PENICILLIN RESISTANT      RIFAMPIN <=0.5 SENSITIVE Sensitive     TRIMETH/SULFA <=10 SENSITIVE Sensitive     VANCOMYCIN <=0.5 SENSITIVE Sensitive     TETRACYCLINE <=1 SENSITIVE Sensitive     MOXIFLOXACIN 4 RESISTANT Resistant     * STAPHYLOCOCCUS AUREUS  MRSA PCR Screening     Status: Abnormal   Collection Time: 08/20/14  7:48 PM  Result Value Ref Range Status   MRSA by PCR POSITIVE (A) NEGATIVE Final    Comment:  The GeneXpert MRSA Assay (FDA approved for NASAL specimens only), is one component of a comprehensive MRSA colonization surveillance program. It is not intended to diagnose MRSA infection nor to guide or monitor treatment for MRSA infections. RESULT CALLED TO, READ BACK BY AND VERIFIED WITH: ARMSTRONG,S RN [phone] [id number] [place],N RESULT CALLED TO, READ BACK BY AND VERIFIED WITH: ARMSTRONG,S RN [phone] [id number] [place],N RESULT CALLED TO, READ BACK BY AND VERIFIED WITH: ARSTRONG,S RN [id number] [place],N   Culture, routine-abscess     Status: None   Collection Time: 08/20/14 10:16 PM  Result Value Ref Range Status   Specimen Description ABSCESS  Final   Special Requests NONE  Final   Gram Stain   Final    MODERATE WBC PRESENT,BOTH PMN AND MONONUCLEAR NO SQUAMOUS EPITHELIAL CELLS SEEN MODERATE GRAM POSITIVE COCCI IN PAIRS IN CLUSTERS Performed at [facility]    Culture   Final    ABUNDANT STAPHYLOCOCCUS AUREUS Note: RIFAMPIN AND GENTAMICIN SHOULD NOT BE USED AS SINGLE DRUGS FOR TREATMENT OF STAPH INFECTIONS. Performed at [facility]    Report Status 08/23/2014 FINAL  Final   Organism ID, Bacteria STAPHYLOCOCCUS AUREUS  Final      Susceptibility   Staphylococcus aureus - MIC*    CLINDAMYCIN <=0.25 SENSITIVE Sensitive     ERYTHROMYCIN <=0.25 SENSITIVE Sensitive     GENTAMICIN <=0.5 SENSITIVE Sensitive     LEVOFLOXACIN >=8 RESISTANT Resistant     OXACILLIN 0.5 SENSITIVE Sensitive     PENICILLIN 0.12 SENSITIVE Sensitive     RIFAMPIN <=0.5 SENSITIVE Sensitive     TRIMETH/SULFA <=10 SENSITIVE Sensitive     VANCOMYCIN 1 SENSITIVE Sensitive     TETRACYCLINE <=1 SENSITIVE Sensitive     MOXIFLOXACIN 4 RESISTANT Resistant     * ABUNDANT STAPHYLOCOCCUS AUREUS  Anaerobic culture     Status: None   Collection Time: 08/20/14 10:16 PM  Result Value Ref Range Status   Specimen Description ABSCESS  Final   Special Requests NONE  Final   Gram Stain   Final    MODERATE WBC PRESENT,BOTH PMN AND MONONUCLEAR NO SQUAMOUS EPITHELIAL CELLS SEEN MODERATE GRAM POSITIVE COCCI IN PAIRS IN CLUSTERS Performed at [facility]    Culture   Final    NO ANAEROBES ISOLATED Performed at [facility]    Report Status 08/25/2014 FINAL  Final  Blood Cultures x 2 sites     Status: None   Collection Time: 08/21/14  4:50 PM  Result Value Ref Range Status   Specimen Description BLOOD RIGHT HAND  Final   Special Requests BOTTLES DRAWN AEROBIC ONLY 10CC  Final   Culture   Final    NO GROWTH 5 DAYS Performed at [facility]    Report Status 08/27/2014 FINAL  Final  Blood Cultures x 2 sites     Status: None   Collection Time: 08/21/14  5:05 PM  Result Value Ref Range Status   Specimen Description BLOOD RIGHT ARM  Final   Special Requests BOTTLES DRAWN AEROBIC AND ANAEROBIC 10CC  Final   Culture   Final    NO GROWTH 5  DAYS Performed at [facility]    Report Status 08/27/2014 FINAL  Final     Labs: Basic Metabolic Panel:  Recent Labs Lab 08/23/14 0419 08/24/14 0415 08/25/14 0430 08/27/14 1218 08/29/14 0630  NA 140 143 142 141  --   K 3.6 3.6 3.5 3.8  --   CL 107 106 105  104  --   CO2 --   GLUCOSE 120* 95 95 94  --   BUN --   CREATININE 0.56 0.47 0.52 0.79  --   CALCIUM 8.4* 8.6* 8.8* 9.1  --   MG 1.9  --   --   --  2.2   Liver Function Tests: No results for input(s): AST, ALT, ALKPHOS, BILITOT, PROT, ALBUMIN in the last 168 hours. No results for input(s): LIPASE, AMYLASE in the last 168 hours. No results for input(s): AMMONIA in the last 168 hours. CBC:  Recent Labs Lab 08/23/14 0419 08/24/14 0415 08/27/14 1218  WBC 5.0 5.5 10.5  HGB 10.1* 10.6* 11.4*  HCT 31.1* 32.0* 34.5*  MCV 103.0* 102.9* 101.5*  PLT 374 450* 667*   Cardiac Enzymes: No results for input(s): CKTOTAL, CKMB, CKMBINDEX, TROPONINI in the last 168 hours. BNP: BNP (last 3 results) No results for input(s): BNP in the last 8760 hours.  ProBNP (last 3 results) No results for input(s): PROBNP in the last 8760 hours.  CBG: No results for input(s): GLUCAP in the last 168 hours.     SignedKathlen Mody  [facility] 08/29/2014, 11:18 AM

## 2014-08-29 NOTE — Progress Notes (Signed)
Chaplain visited patient. Chaplain provided emotional support.  Patient says that this is discharge day for her. Patient says she    08/29/14 1200  Clinical Encounter Type  Visited With Patient  Visit Type Follow-up;Spiritual support;Social support  Referral From Family   is doing better. Chaplain left literature for patient. Patient thanked Chaplain for the support.

## 2014-08-29 NOTE — Clinical Social Work Placement (Signed)
Patient is set to discharge to [facility] SN[facility] today. Patient & mother aware. Discharge packet given to RN, Florentina Addison. [facility] will be called for transport once PICC line is removed.     Lincoln Maxin, LCSW [facility] Clinical Social Worker cell #: [phone]    CLINICAL SOCIAL WORK PLACEMENT  NOTE  Date:  08/29/2014  Patient Details  Name: Deanna Ball MRN: [id number] Date of Birth: 1960/07/17  Clinical Social Work is seeking post-discharge placement for this patient at the Skilled  Nursing Facility level of care (*CSW will initial, date and re-position this form in  chart as items are completed):  Yes   Patient/family provided with [place] Clinical Social Work Department's list of facilities offering this level of care within the geographic area requested by the patient (or if unable, by the patient's family).  Yes   Patient/family informed of their freedom to choose among providers that offer the needed level of care, that participate in Medicare, Medicaid or managed care program needed by the patient, have an available bed and are willing to accept the patient.  Yes   Patient/family informed of [facility]'s ownership interest in [facility] and [facility], as well as of the fact that they are under no obligation to receive care at these facilities.  PASRR submitted to EDS on 08/26/14     PASRR number received on 08/26/14     Existing PASRR number confirmed on       FL2 transmitted to all facilities in geographic area requested by pt/family on 08/26/14     FL2 transmitted to all facilities within larger geographic area on       Patient informed that his/her managed care company has contracts with or will negotiate with certain facilities, including the following:        Yes   Patient/family informed of bed offers received.  Patient chooses bed at [facility]     Physician recommends and patient chooses bed at      Patient to be  transferred to [facility] on 08/29/14.  Patient to be transferred to facility by [facility]     Patient family notified on 08/29/14 of transfer.  Name of family member notified:  patient made mother aware     PHYSICIAN       Additional Comment:    _______________________________________________ [name], LCSW 08/29/2014, 12:20 PM

## 2014-08-29 NOTE — Progress Notes (Signed)
Report given to Alcario Drought, Charity fundraiser at [facility].  All questions answered.  Made RN aware of pt PICC line.  Went over medications and times given.  Yellow packet given to [facility].  VSS. PICC line flushed by IV nurse.  Pt wheeled out on stretcher.

## 2014-09-11 ENCOUNTER — Telehealth: Payer: Self-pay | Admitting: Infectious Disease

## 2014-09-11 NOTE — Telephone Encounter (Signed)
Received call from RN at SNF   Patient wants to go home on July 1st but doesn't think she can do ancef 2g iv q 8 hours though I think that she could actually do this IF she sees what home infusion co is like (it is typically easier)  2nd option is change to IV rocephin 2 grams daily with syringe push at home with home RN or family to help  3rd option dose with oritancin prior to dc  I am trying to get her through 8 weeks of parenteral therapy

## 2014-10-01 ENCOUNTER — Encounter: Payer: Self-pay | Admitting: Infectious Disease

## 2014-10-01 ENCOUNTER — Observation Stay (HOSPITAL_COMMUNITY)
Admission: AD | Admit: 2014-10-01 | Discharge: 2014-10-02 | Disposition: A | Discharge status: Skilled Nursing Facility | Payer: Medicare Other | Source: Ambulatory Visit | Attending: Internal Medicine | Admitting: Internal Medicine

## 2014-10-01 ENCOUNTER — Inpatient Hospital Stay (HOSPITAL_COMMUNITY): Payer: Medicare Other

## 2014-10-01 ENCOUNTER — Ambulatory Visit (INDEPENDENT_AMBULATORY_CARE_PROVIDER_SITE_OTHER): Payer: Medicare Other | Admitting: Infectious Disease

## 2014-10-01 VITALS — BP 125/84 | HR 87 | Temp 98.3°F | Wt 160.0 lb

## 2014-10-01 DIAGNOSIS — F17210 Nicotine dependence, cigarettes, uncomplicated: Secondary | ICD-10-CM | POA: Insufficient documentation

## 2014-10-01 DIAGNOSIS — R29898 Other symptoms and signs involving the musculoskeletal system: Secondary | ICD-10-CM

## 2014-10-01 DIAGNOSIS — M5136 Other intervertebral disc degeneration, lumbar region: Principal | ICD-10-CM | POA: Insufficient documentation

## 2014-10-01 DIAGNOSIS — M329 Systemic lupus erythematosus, unspecified: Secondary | ICD-10-CM | POA: Insufficient documentation

## 2014-10-01 DIAGNOSIS — A4101 Sepsis due to Methicillin susceptible Staphylococcus aureus: Secondary | ICD-10-CM

## 2014-10-01 DIAGNOSIS — N151 Renal and perinephric abscess: Secondary | ICD-10-CM | POA: Diagnosis not present

## 2014-10-01 DIAGNOSIS — M7138 Other bursal cyst, other site: Secondary | ICD-10-CM | POA: Diagnosis not present

## 2014-10-01 DIAGNOSIS — K6812 Psoas muscle abscess: Secondary | ICD-10-CM

## 2014-10-01 DIAGNOSIS — R7881 Bacteremia: Secondary | ICD-10-CM | POA: Diagnosis present

## 2014-10-01 DIAGNOSIS — M5416 Radiculopathy, lumbar region: Secondary | ICD-10-CM | POA: Insufficient documentation

## 2014-10-01 DIAGNOSIS — F1010 Alcohol abuse, uncomplicated: Secondary | ICD-10-CM | POA: Diagnosis present

## 2014-10-01 DIAGNOSIS — L0291 Cutaneous abscess, unspecified: Secondary | ICD-10-CM

## 2014-10-01 DIAGNOSIS — M069 Rheumatoid arthritis, unspecified: Secondary | ICD-10-CM

## 2014-10-01 DIAGNOSIS — Z452 Encounter for adjustment and management of vascular access device: Secondary | ICD-10-CM

## 2014-10-01 DIAGNOSIS — M25551 Pain in right hip: Secondary | ICD-10-CM | POA: Diagnosis not present

## 2014-10-01 DIAGNOSIS — A4901 Methicillin susceptible Staphylococcus aureus infection, unspecified site: Secondary | ICD-10-CM | POA: Diagnosis not present

## 2014-10-01 DIAGNOSIS — IMO0002 Reserved for concepts with insufficient information to code with codable children: Secondary | ICD-10-CM | POA: Diagnosis present

## 2014-10-01 DIAGNOSIS — F419 Anxiety disorder, unspecified: Secondary | ICD-10-CM | POA: Diagnosis present

## 2014-10-01 DIAGNOSIS — N135 Crossing vessel and stricture of ureter without hydronephrosis: Secondary | ICD-10-CM

## 2014-10-01 DIAGNOSIS — M549 Dorsalgia, unspecified: Secondary | ICD-10-CM

## 2014-10-01 DIAGNOSIS — B9561 Methicillin susceptible Staphylococcus aureus infection as the cause of diseases classified elsewhere: Secondary | ICD-10-CM | POA: Diagnosis present

## 2014-10-01 DIAGNOSIS — R5381 Other malaise: Secondary | ICD-10-CM | POA: Diagnosis present

## 2014-10-01 HISTORY — DX: Anxiety disorder, unspecified: F41.9

## 2014-10-01 HISTORY — DX: Renal and perinephric abscess: N15.1

## 2014-10-01 HISTORY — DX: Gastro-esophageal reflux disease without esophagitis: K21.9

## 2014-10-01 HISTORY — DX: Other bursal cyst, unspecified site: M71.30

## 2014-10-01 LAB — CBC
HCT: 39.4 % (ref 36.0–46.0)
Hemoglobin: 12.8 g/dL (ref 12.0–15.0)
MCH: 32.4 pg (ref 26.0–34.0)
MCHC: 32.5 g/dL (ref 30.0–36.0)
MCV: 99.7 fL (ref 78.0–100.0)
PLATELETS: 288 10*3/uL (ref 150–400)
RBC: 3.95 MIL/uL (ref 3.87–5.11)
RDW: 12.2 % (ref 11.5–15.5)
WBC: 6.1 10*3/uL (ref 4.0–10.5)

## 2014-10-01 LAB — COMPREHENSIVE METABOLIC PANEL
ALT: 10 U/L — ABNORMAL LOW (ref 14–54)
AST: 33 U/L (ref 15–41)
Albumin: 4 g/dL (ref 3.5–5.0)
Alkaline Phosphatase: 98 U/L (ref 38–126)
Anion gap: 8 (ref 5–15)
BILIRUBIN TOTAL: 0.5 mg/dL (ref 0.3–1.2)
BUN: 10 mg/dL (ref 6–20)
CHLORIDE: 102 mmol/L (ref 101–111)
CO2: 28 mmol/L (ref 22–32)
CREATININE: 0.77 mg/dL (ref 0.44–1.00)
Calcium: 9.6 mg/dL (ref 8.9–10.3)
GFR calc non Af Amer: >60 mL/min (ref >60)
Glucose, Bld: 90 mg/dL (ref 65–99)
POTASSIUM: 4 mmol/L (ref 3.5–5.1)
Sodium: 138 mmol/L (ref 135–145)
TOTAL PROTEIN: 8 g/dL (ref 6.5–8.1)

## 2014-10-01 LAB — SEDIMENTATION RATE: Sed Rate: 33 mm/hr — ABNORMAL HIGH (ref 0–22)

## 2014-10-01 LAB — C-REACTIVE PROTEIN: CRP: <0.5 mg/dL (ref <1.0)

## 2014-10-01 MED ORDER — SODIUM CHLORIDE 0.9 % IV SOLN
INTRAVENOUS | Status: DC
  Administered 2014-10-01 – 2014-10-02 (×2): via INTRAVENOUS

## 2014-10-01 MED ORDER — HEPARIN SODIUM (PORCINE) 5000 UNIT/ML IJ SOLN
5000.0000 [IU] | Freq: Three times a day (TID) | INTRAMUSCULAR | Status: DC
  Administered 2014-10-01 – 2014-10-02 (×3): 5000 [IU] via SUBCUTANEOUS
  Filled 2014-10-01 (×3): qty 1

## 2014-10-01 MED ORDER — OXYCODONE HCL 5 MG PO TABS
5.0000 mg | ORAL_TABLET | ORAL | Status: DC | PRN
  Administered 2014-10-01 – 2014-10-02 (×4): 5 mg via ORAL
  Filled 2014-10-01 (×4): qty 1

## 2014-10-01 MED ORDER — POLYETHYLENE GLYCOL 3350 17 G PO PACK
17.0000 g | PACK | Freq: Every day | ORAL | Status: DC
  Filled 2014-10-01 (×2): qty 1

## 2014-10-01 MED ORDER — SENNA 8.6 MG PO TABS
2.0000 | ORAL_TABLET | Freq: Every day | ORAL | Status: DC
  Administered 2014-10-01: 17.2 mg via ORAL
  Filled 2014-10-01: qty 2

## 2014-10-01 MED ORDER — CEFAZOLIN SODIUM-DEXTROSE 2-3 GM-% IV SOLR
2.0000 g | Freq: Three times a day (TID) | INTRAVENOUS | Status: DC
  Administered 2014-10-01 – 2014-10-02 (×3): 2 g via INTRAVENOUS
  Filled 2014-10-01 (×6): qty 50

## 2014-10-01 MED ORDER — METHOTREXATE 2.5 MG PO TABS: 25.0000 mg | ORAL_TABLET | ORAL | Status: DC

## 2014-10-01 MED ORDER — PANTOPRAZOLE SODIUM 40 MG PO TBEC
40.0000 mg | DELAYED_RELEASE_TABLET | Freq: Every day | ORAL | Status: DC
  Administered 2014-10-02: 40 mg via ORAL
  Filled 2014-10-01: qty 1

## 2014-10-01 MED ORDER — HYDROCODONE-ACETAMINOPHEN 5-325 MG PO TABS: 1.0000 | ORAL_TABLET | ORAL | Status: DC | PRN

## 2014-10-01 MED ORDER — ALPRAZOLAM 0.5 MG PO TABS
1.0000 mg | ORAL_TABLET | Freq: Two times a day (BID) | ORAL | Status: DC
  Administered 2014-10-01 – 2014-10-02 (×2): 1 mg via ORAL
  Filled 2014-10-01 (×2): qty 2

## 2014-10-01 MED ORDER — ONDANSETRON HCL 4 MG PO TABS: 4.0000 mg | ORAL_TABLET | Freq: Four times a day (QID) | ORAL | Status: DC | PRN

## 2014-10-01 MED ORDER — ALPRAZOLAM 0.5 MG PO TABS: 1.0000 mg | ORAL_TABLET | Freq: Two times a day (BID) | ORAL | Status: DC

## 2014-10-01 MED ORDER — ONDANSETRON HCL 4 MG/2ML IJ SOLN: 4.0000 mg | Freq: Four times a day (QID) | INTRAMUSCULAR | Status: DC | PRN

## 2014-10-01 MED ORDER — PREDNISONE 5 MG PO TABS
2.5000 mg | ORAL_TABLET | Freq: Two times a day (BID) | ORAL | Status: DC
  Administered 2014-10-02 (×2): 2.5 mg via ORAL
  Filled 2014-10-01 (×2): qty 1

## 2014-10-01 MED ORDER — GADOBENATE DIMEGLUMINE 529 MG/ML IV SOLN
15.0000 mL | Freq: Once | INTRAVENOUS | Status: AC | PRN
  Administered 2014-10-01: 15 mL via INTRAVENOUS

## 2014-10-01 MED ORDER — FOLIC ACID 1 MG PO TABS
1.0000 mg | ORAL_TABLET | Freq: Every day | ORAL | Status: DC
  Administered 2014-10-02: 1 mg via ORAL
  Filled 2014-10-01: qty 1

## 2014-10-01 NOTE — Progress Notes (Signed)
Pt is admitted to 4N24 from Dr office

## 2014-10-01 NOTE — Consult Note (Signed)
Reason for Consult:numbness left leg Referring Physician: [name] is an 54 y.o. female.  HPI: patient seen today by ID who has been following her because kidney infection. She is having numbness of the left leg going to the foot. Mri lumbar was obtained and we were called for evaluation  Past Medical History  Diagnosis Date  . Lupus   . Rheumatoid arthritis   . Seizures   . Shortness of breath dyspnea     Past Surgical History  Procedure Laterality Date  . Hernia repair    . Appendectomy    . Lumbar fusion    . Kidney stent placement    . Tee without cardioversion N/A 08/25/2014    Procedure: TRANSESOPHAGEAL ECHOCARDIOGRAM (TEE);  Surgeon: Sanda Klein, MD;  Location: [facility] ENDOSCOPY;  Service: Cardiovascular;  Laterality: N/A;    Family History  Problem Relation Age of Onset  . Lupus Maternal Uncle     Social History:  reports that she has been smoking Cigarettes.  She has been smoking about 0.50 packs per day. She does not have any smokeless tobacco history on file. She reports that she drinks alcohol. She reports that she does not use illicit drugs.  Allergies: No Known Allergies  Medications: see hp  Results for orders placed or performed during the hospital encounter of 10/01/14 (from the past 48 hour(s))  Sedimentation rate     Status: Abnormal   Collection Time: 10/01/14  5:00 PM  Result Value Ref Range   Sed Rate 33 (H) 0 - 22 mm/hr  Comprehensive metabolic panel     Status: Abnormal   Collection Time: 10/01/14  5:00 PM  Result Value Ref Range   Sodium 138 135 - 145 mmol/L   Potassium 4.0 3.5 - 5.1 mmol/L   Chloride 102 101 - 111 mmol/L   CO2 28 22 - 32 mmol/L   Glucose, Bld 90 65 - 99 mg/dL   BUN 10 6 - 20 mg/dL   Creatinine, Ser 0.77 0.44 - 1.00 mg/dL   Calcium 9.6 8.9 - 10.3 mg/dL   Total Protein 8.0 6.5 - 8.1 g/dL   Albumin 4.0 3.5 - 5.0 g/dL   AST 33 15 - 41 U/L   ALT 10 (L) 14 - 54 U/L   Alkaline Phosphatase 98 38 - 126 U/L   Total  Bilirubin 0.5 0.3 - 1.2 mg/dL   GFR calc non Af Amer >60 >60 mL/min   GFR calc Af Amer >60 >60 mL/min    Comment: (NOTE) The eGFR has been calculated using the CKD EPI equation. This calculation has not been validated in all clinical situations. eGFR's persistently <60 mL/min signify possible Chronic Kidney Disease.    Anion gap 8 5 - 15  CBC     Status: None   Collection Time: 10/01/14  5:00 PM  Result Value Ref Range   WBC 6.1 4.0 - 10.5 K/uL   RBC 3.95 3.87 - 5.11 MIL/uL   Hemoglobin 12.8 12.0 - 15.0 g/dL   HCT 39.4 36.0 - 46.0 %   MCV 99.7 78.0 - 100.0 fL   MCH 32.4 26.0 - 34.0 pg   MCHC 32.5 30.0 - 36.0 g/dL   RDW 12.2 11.5 - 15.5 %   Platelets 288 150 - 400 K/uL  C-reactive protein     Status: None   Collection Time: 10/01/14  5:00 PM  Result Value Ref Range   CRP <0.5 <1.0 mg/dL    Mr Lumbar Spine W  Wo Contrast  10/01/2014   CLINICAL DATA:  Right lower extremity and hip pain with right leg and foot numbness. Infectious bacteremia and so S and perinephric abscess.  EXAM: MRI LUMBAR SPINE WITHOUT AND WITH CONTRAST  TECHNIQUE: Multiplanar and multiecho pulse sequences of the lumbar spine were obtained without and with intravenous contrast.  CONTRAST:  26mL MULTIHANCE GADOBENATE DIMEGLUMINE 529 MG/ML IV SOLN  COMPARISON:  CT 08/27/2014.  MRI 08/22/2014.  FINDINGS: There appears to be successful treatment of the right renal all an so S inflammatory disease. No evidence of residual edema or any fluid collection in that region.  There is no significant finding at L 3 for or above. No disc herniation. No stenosis or neural compression.  L4-5: There is advanced bilateral facet arthropathy with a gaping, fluid-filled facet joint on the left. No malalignment, though anterolisthesis could occur with standing or flexion. The patient has developed a synovial cyst projecting inward from the facet on the left that appears to have some mass effect upon the left side of the thecal sac. The disc is  degenerated in shows circumferential protrusion with a focal disc fragment in the left extra foraminal region that could focally affect the left L4 nerve root. There is some enhancement of the facet joints in of the vertebral endplates, most consistent with degenerative enhancement. There is no finding at this level to specifically allow diagnosis of spinal infection.  L5-S1: Previous decompression and fusion procedure. No change with sufficient patency of the canal and foramina at this level.  IMPRESSION: No evidence of ongoing inflammation in the right retroperitoneum/ psoas muscle region.  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 Gaping, fluid-filled facet joints, left more than right. New development of a synovial cyst measuring 10-12 mm in size projecting into the canal from the facet joint on the left. This encroaches upon the thecal sac on the left and could affect the left L5 nerve root, but does not appear to affect the right side. This appearance could worsen with standing or flexion, as anterolisthesis may occur based on the appearance of the facet joints. There is enhancement of the facet joints, of the synovial cyst and of the vertebral endplates, all of which is most likely degenerative. Early infection could not be excluded but is not favored based on the imaging.   Electronically Signed   By: Nelson Chimes M.D.   On: 10/01/2014 19:12   Dg Lumb Spine Flex&ext Only  10/01/2014   CLINICAL DATA:  54 year old female with persistent lower back pain over the past week  EXAM: LUMBAR SPINE FLEX AND EXTEND ONLY - 2-3 VIEW  COMPARISON:  Lumbar spine MRI obtained earlier today  FINDINGS: Lateral views of the spine in flexion and extension demonstrate grade 1 anterolisthesis of L5 on S1 without evidence of pathologic motion. Very mild grade 1 anterolisthesis of L4 on L5 is also stable. Lower lumbar facet arthropathy. No evidence of acute fracture or malalignment.  IMPRESSION: Grade 1 anterolisthesis of L4 on L5  and grade 1-2 anterolisthesis of L5 on S1 without evidence of pathologic motion on flexion or extension.   Electronically Signed   By: Jacqulynn Cadet M.D.   On: 10/01/2014 19:52    Review of Systems  Constitutional: Negative.   HENT: Negative.   Eyes: Negative.   Respiratory: Negative.   Cardiovascular: Negative.   Gastrointestinal: Negative.   Genitourinary: Negative.   Musculoskeletal: Positive for back pain.  Skin: Negative.   Neurological: Positive for sensory change.  Endo/Heme/Allergies: Negative.  Psychiatric/Behavioral: Negative.    Blood pressure 141/72, pulse 83, temperature 98.6 F (37 C), temperature source Oral, resp. rate 20, SpO2 100 %. Physical Examn NEURO  Awake, oriented x 3. No weakness in the lower extremities. Sensory changes at left l4-5 dermatomes. Able to walk in tiptoes and heels. 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 No evidence of infection  Assessment/Plan: Spoke with her and her mother. No need for surgical intervention because of neuro findings and her ongoing infection.  She can get help from having a lumbar brace and will see her in my office once her infection is taken care of or before if her neuro condition changes. She has our group phone number and name  Seba Madole M 10/01/2014, 8:07 PM

## 2014-10-01 NOTE — H&P (Signed)
[facility] History and Physical                                                                                    Deanna Ball, is a 54 y.o. female  MRN: [id number]   DOB - [birth year]  Admit Date - 10/01/2014  Outpatient Primary MD for the patient is Cleda Mccreedy, MD  Referring MD: Tommy Medal / ID  With History of -  Past Medical History  Diagnosis Date  . Lupus   . Rheumatoid arthritis   . Seizures   . Shortness of breath dyspnea       Past Surgical History  Procedure Laterality Date  . Hernia repair    . Appendectomy    . Lumbar fusion    . Kidney stent placement    . Tee without cardioversion N/A 08/25/2014    Procedure: TRANSESOPHAGEAL ECHOCARDIOGRAM (TEE);  Surgeon: Sanda Klein, MD;  Location: [facility] ENDOSCOPY;  Service: Cardiovascular;  Laterality: N/A;    in for   No chief complaint on file.    HPI This is a 53 yo female patient with history of alcohol abuse anxiety disorder GERD who was discharged from the hospital on 6/10 after being treated for perinephric abscess and associated psoas muscle abscess resulting in staph bacteremia. She required a percutaneous nephrostomy tube which was discontinued prior to discharge. She was evaluated by ID with recommendations to continue IV cefazolin through a PICC line until 7/27. Because of significant deconditioning she was discharged to a skilled nursing facility for further rehabilitative therapies. Patient was at post discharge follow-up with the infectious disease service today when she reported numbness in her right lower extremity with ambulation, and increasing pain in the right hip and groin limiting strength in her right leg. She also reported recent intermittent fevers peaking to 100F associated with chills in the past 7 days. Patient has also noticed slight redness at the insertion site of the right upper extremity PICC line. Because of these new problems patient was sent to [facility] for direct  admission.  Upon arrival patient was found to be afebrile, BP 125/84 pulse 87. Room air saturations are 100%. She is primarily complaining of pain in her right hip and concerned that she has missed her medications including her most recent antibiotic dosage. When further questioned patient reports up until 3 days ago she had been successfully able to participate in physical therapy with ambulation and utilization of the stationary bike but has now noticed a drawing-type sensation in her right groin and hip with ambulation which is associated with foot numbness. When she is not bearing weight she does not have any foot numbness. She was not reporting any incontinence symptoms. Other symptoms as documented above. She reports she had been having some discomfort at the prior percutaneous nephrostomy insertion site on her back.   Review of Systems   In addition to the HPI above,  No Headache, changes with Vision or hearing, new weakness, tingling, numbness in any extremity, No problems swallowing food or Liquids, indigestion/reflux No Chest pain, Cough or Shortness of Breath, palpitations, orthopnea or DOE No Abdominal pain, N/V; no melena or hematochezia,  no dark tarry stools, Bowel movements are regular, No dysuria, hematuria or flank pain No new skin rashes, lesions, masses or bruises, No recent weight gain or loss No polyuria, polydypsia or polyphagia,  *A full 10 point Review of Systems was done, except as stated above, all other Review of Systems were negative.  Social History History  Substance Use Topics  . Smoking status: Current Every Day Smoker -- 0.50 packs/day    Types: Cigarettes  . Smokeless tobacco: Not on file  . Alcohol Use: 0.0 oz/week    0 Standard drinks or equivalent per week     Comment: patient drinks 2 bottles of wild irish rose wine last drink 4 days ago     Resides at: Temporarily at skilled nursing facility for rehabilitation services  Lives with:  N/A  Ambulatory status: With assistance   Family History Family History  Problem Relation Age of Onset  . Lupus Maternal Uncle      Prior to Admission medications   Medication Sig Start Date End Date Taking? Authorizing Provider  ALPRAZolam Duanne Moron) 1 MG tablet Take 1 tablet (1 mg total) by mouth 2 (two) times daily. 08/29/14   Hosie Poisson, MD  ceFAZolin (ANCEF) 2-3 GM-% SOLR Inject 50 mLs (2 g total) into the vein every 8 (eight) hours. 08/29/14 10/15/14  Hosie Poisson, MD  folic acid (FOLVITE) 1 MG tablet Take 1 tablet (1 mg total) by mouth daily. Patient not taking: Reported on 10/01/2014 08/29/14   Hosie Poisson, MD  HYDROcodone-acetaminophen (NORCO/VICODIN) 5-325 MG per tablet  07/31/14   Historical Provider, MD  methotrexate (RHEUMATREX) 2.5 MG tablet Take 25 mg by mouth 2 (two) times a week. Caution:Chemotherapy. Protect from light.    Historical Provider, MD  NEXIUM 40 MG capsule  07/17/14   Historical Provider, MD  oxyCODONE (OXY IR/ROXICODONE) 5 MG immediate release tablet Take 1-2 tablets (5-10 mg total) by mouth every 4 (four) hours as needed for moderate pain. 08/29/14   Hosie Poisson, MD  polyethylene glycol (MIRALAX / GLYCOLAX) packet Take 17 g by mouth daily. 08/29/14   Hosie Poisson, MD  predniSONE (DELTASONE) 2.5 MG tablet Take 2.5 mg by mouth 2 (two) times daily with a meal.    Historical Provider, MD  predniSONE (DELTASONE) 5 MG tablet Take 5 mg by mouth 2 (two) times daily. 07/03/14   Historical Provider, MD  senna (SENOKOT) 8.6 MG TABS tablet Take 2 tablets (17.2 mg total) by mouth at bedtime. 08/29/14   Hosie Poisson, MD  thiamine 100 MG tablet Take 1 tablet (100 mg total) by mouth daily. Patient not taking: Reported on 10/01/2014 08/29/14   Hosie Poisson, MD  Vitamin D, Ergocalciferol, (DRISDOL) 50000 UNITS CAPS capsule Take 50,000 Units by mouth every 7 (seven) days.    Historical Provider, MD    No Known Allergies  Physical Exam  Vitals  Blood pressure 141/72, pulse 83,  temperature 98.6 F (37 C), temperature source Oral, resp. rate 20, SpO2 100 %.   General:  In no acute distress, appears healthy and well nourished  Psych:  Very anxious affect, becomes tearful describing most recent admission and condition later relating "it's all in God's hands", Denies Suicidal or Homicidal ideations, Awake Alert, Oriented X 3., no apparent short term memory deficits  Neuro:   No focal neurological deficits, CN II through XII intact, Strength 5/5 all 4 extremities except for right lower extremity with strength 4/5 but limitation is related to pain and not to true deficit/weakness; right straight  leg raise reproduces right groin and hip pain, Sensation intact all 4 extremities. Numbness right foot only with weight bearing  ENT:  Ears and Eyes appear Normal, Conjunctivae clear, PER. Moist oral mucosa without erythema or exudates.  Neck:  Supple, No lymphadenopathy appreciated  Respiratory:  Symmetrical chest wall movement, Good air movement bilaterally, CTAB. Room Air  Cardiac:  RRR, No Murmurs, no LE edema noted, no JVD, No carotid bruits, peripheral pulses palpable at 2+  Abdomen:  Positive bowel sounds, Soft, Non tender, Non distended,  No masses appreciated, no obvious hepatosplenomegaly  Skin:  No Cyanosis, Normal Skin Turgor, No Skin Rash or Bruise. Well-healed puncture wound scars on right lower back minimally tender to palpation without any redness, fluctuance or drainage. PICC line insertion site right upper extremity slightly reddened without any erythema or definite purulent drainage  Extremities: Symmetrical without obvious trauma or injury,  no effusions.  Data Review  CBC No results for input(s): WBC, HGB, HCT, PLT, MCV, MCH, MCHC, RDW, LYMPHSABS, MONOABS, EOSABS, BASOSABS, BANDABS in the last 168 hours.  Invalid input(s): NEUTRABS, BANDSABD  Chemistries  No results for input(s): NA, K, CL, CO2, GLUCOSE, BUN, CREATININE, CALCIUM, MG, AST, ALT, ALKPHOS,  BILITOT in the last 168 hours.  Invalid input(s): GFRCGP  CrCl cannot be calculated (Patient has no serum creatinine result on file.).  No results for input(s): TSH, T4TOTAL, T3FREE, THYROIDAB in the last 72 hours.  Invalid input(s): FREET3  Coagulation profile No results for input(s): INR, PROTIME in the last 168 hours.  No results for input(s): DDIMER in the last 72 hours.  Cardiac Enzymes No results for input(s): CKMB, TROPONINI, MYOGLOBIN in the last 168 hours.  Invalid input(s): CK  Invalid input(s): POCBNP  Urinalysis    Component Value Date/Time   COLORURINE YELLOW 08/20/2014 1315   APPEARANCEUR HAZY* 08/20/2014 1315   LABSPEC 1.020 08/20/2014 1315   PHURINE 6.0 08/20/2014 1315   GLUCOSEU NEGATIVE 08/20/2014 1315   HGBUR LARGE* 08/20/2014 1315   BILIRUBINUR SMALL* 08/20/2014 1315   KETONESUR NEGATIVE 08/20/2014 1315   PROTEINUR 100* 08/20/2014 1315   UROBILINOGEN 2.0* 08/20/2014 1315   NITRITE POSITIVE* 08/20/2014 1315   LEUKOCYTESUR MODERATE* 08/20/2014 1315    Imaging results:   No results found.   Assessment & Plan  Principal Problem:   Right leg weakness/acute right hip pain -Admit to neurology floor -Weakness in right leg reproducible with straight leg raise and her limitations and lifting the leg are directly associated to significance of pain -Given known history of bacteremia with associated right perinephric abscess and psoas muscle abscess need to exclude seeding to lower spine and right hip (especially since on chronic immunosuppression) -Check CT right hip -Check MRI lumbar spine -Continue preadmission pain medications -NPO until imaging returned in the event patient may require emergent surgery-primary concern is for possible discitis causing early neurological deficit/cord compression  Active Problems:   Bacteremia due to Staphylococcus aureus -Continue preadmission cefazolin IV -Repeat blood cultures -PICC line insertion site slightly  reddened without definitive purulent drainage; will ask IV team to evaluate -Check CRP and ESR    Recent Perinephric abscess/psoas abscess -Percutaneous nephrostomy tube discontinued previous admission -No definitive urinary symptoms that as precaution we'll repeat urinalysis and culture    Lupus/Rheumatoid arthritis -On chronic immunosuppression with Rheumatrex and twice a day prednisone; continue -Hemodynamically stable so no indication for stress dose steroids      Anxiety/Alcohol abuse -Continue preadmission Xanax -Has been in skilled nursing facility since 6/10 and has  not been consuming alcohol    Physical deconditioning -If no findings on imaging that would warrant limiting PT/OT will need to reorder beginning 7/14 -Suspect will return to skilled nursing facility once medically stable after this admission    DVT Prophylaxis: Subcutaneous heparin  Family Communication:   No family at bedside  Code Status: Full code  Condition:  Stable  Discharge disposition: Anticipate discharge back to skilled nursing facility for further rehabilitative therapies pending outcome of above workup and repeat PT/OT evaluations  Time spent in minutes : 60      Meggan Dhaliwal L. ANP on 10/01/2014 at 5:11 PM  Between 7am to 7pm - Pager - [phone]  After 7pm go to [email or web address] - password [email or web address]  And look for the night coverage person covering me after hours  [facility]

## 2014-10-01 NOTE — Progress Notes (Signed)
Subjective:    Patient ID: Deanna Ball, female    DOB: Aug 23, 1960, 54 y.o.   MRN: [id number]  HPI Mrs. Pollio is a 54 y/o female with PMH of SLE with chronic predisone and methotrexate use who was admitted to [facility] on 08/20/14 for perinephric abscess extending to psoas muscle and ureteral obstruction due to MSSA.. TEE done while in hospital was negative for vegetation. A nephrostomy tube and drain was placed during her hospitalization and D/C'd prior to sending her to a SNF. She was sent to the SNF with a RUE PICC for an eight week course of IV Cefazolin. End date for IV ABX is 10/15/14. She states over the past week she has noticed increasingly worse numbness when she walks which resolves when she rests. She also has been having pain that has limited the strength to her right leg. She has had low intermittent fevers over the past week that have peaked around 100F. She does feel chilled at times. Her mother is also concerned about her PICC line stating that her skin is getting red and may have pus coming out from the insertion site.   Patient Active Problem List   Diagnosis Date Noted  . Right-sided chest pain   . Staphylococcus aureus bacteremia with sepsis   . Abscess   . Ureteral obstruction   . Screening for STD (sexually transmitted disease)   . Psoas abscess   . Perinephric abscess 08/20/2014  . Pyelonephritis 08/20/2014  . Hydronephrosis 08/20/2014  . Lupus 08/20/2014  . Rheumatoid arthritis 08/20/2014  . Anxiety 08/20/2014  . GERD without esophagitis 08/20/2014  . Alcohol abuse 08/20/2014  . Transaminitis 08/20/2014     Outpatient Encounter Prescriptions as of 10/01/2014  Medication Sig Note  . ALPRAZolam (XANAX) 1 MG tablet Take 1 tablet (1 mg total) by mouth 2 (two) times daily.   Marland Kitchen ceFAZolin (ANCEF) 2-3 GM-% SOLR Inject 50 mLs (2 g total) into the vein every 8 (eight) hours.   . methotrexate (RHEUMATREX) 2.5 MG tablet Take 25 mg by mouth 2 (two) times a week.  Caution:Chemotherapy. Protect from light.   Marland Kitchen oxyCODONE (OXY IR/ROXICODONE) 5 MG immediate release tablet Take 1-2 tablets (5-10 mg total) by mouth every 4 (four) hours as needed for moderate pain.   . polyethylene glycol (MIRALAX / GLYCOLAX) packet Take 17 g by mouth daily.   . predniSONE (DELTASONE) 2.5 MG tablet Take 2.5 mg by mouth 2 (two) times daily with a meal.   . senna (SENOKOT) 8.6 MG TABS tablet Take 2 tablets (17.2 mg total) by mouth at bedtime.   . Vitamin D, Ergocalciferol, (DRISDOL) 50000 UNITS CAPS capsule Take 50,000 Units by mouth every 7 (seven) days.   . folic acid (FOLVITE) 1 MG tablet Take 1 tablet (1 mg total) by mouth daily. (Patient not taking: Reported on 10/01/2014)   . NEXIUM 40 MG capsule  08/20/2014: Received from: External Pharmacy  . thiamine 100 MG tablet Take 1 tablet (100 mg total) by mouth daily. (Patient not taking: Reported on 10/01/2014)    No facility-administered encounter medications on file as of 10/01/2014.      Review of Systems  Constitutional: Positive for fever. Negative for chills, diaphoresis, appetite change, fatigue and unexpected weight change.  HENT: Negative.   Eyes: Negative for photophobia and visual disturbance.  Respiratory: Negative for chest tightness and shortness of breath.   Cardiovascular: Negative for chest pain.  Gastrointestinal: Positive for abdominal distention. Negative for nausea, vomiting, abdominal pain, diarrhea  and constipation.  Genitourinary: Negative for dysuria, urgency, hematuria, flank pain, decreased urine volume and difficulty urinating.  Musculoskeletal: Positive for back pain. Negative for myalgias and neck stiffness.  Skin: Negative for rash and wound.  Neurological: Positive for weakness and numbness. Negative for dizziness, syncope, light-headedness and headaches.       States her right leg and foot goes numb when she walks.  Hematological: Negative for adenopathy.  Psychiatric/Behavioral: Negative for  behavioral problems, sleep disturbance, decreased concentration and agitation. The patient is not nervous/anxious.        Objective:   Physical Exam  Constitutional: She is oriented to person, place, and time. She appears well-developed and well-nourished.  HENT:  Head: Normocephalic and atraumatic.  Mouth/Throat: No oropharyngeal exudate.  Eyes: Conjunctivae are normal. Pupils are equal, round, and reactive to light.  Neck: Normal range of motion. Neck supple.  Cardiovascular: Normal rate and regular rhythm.   Pulmonary/Chest: Effort normal and breath sounds normal.  Abdominal: Soft. She exhibits distension. There is no tenderness.  Musculoskeletal: Normal range of motion. She exhibits tenderness.  Tenderness to lumbar/sacral spine with palpation  Lymphadenopathy:    She has no cervical adenopathy.  Neurological: She is alert and oriented to person, place, and time.  4/5 strength to hip flexion, weaker than left side.  Skin: Skin is warm and dry. No rash noted. No erythema.  PICC to RUE, red around insertion site. No purulent drainage noted.   Psychiatric: She has a normal mood and affect. Her behavior is normal. Judgment and thought content normal.    Blood pressure 125/84, pulse 87, temperature 98.3 F (36.8 C), temperature source Oral, weight 160 lb (72.576 kg).  CBC    Component Value Date/Time   WBC 10.5 08/27/2014 1218   RBC 3.40* 08/27/2014 1218   HGB 11.4* 08/27/2014 1218   HCT 34.5* 08/27/2014 1218   PLT 667* 08/27/2014 1218   MCV 101.5* 08/27/2014 1218   MCH 33.5 08/27/2014 1218   MCHC 33.0 08/27/2014 1218   RDW 13.5 08/27/2014 1218   LYMPHSABS 1.3 08/20/2014 1140   MONOABS 1.0 08/20/2014 1140   EOSABS 0.0 08/20/2014 1140   BASOSABS 0.0 08/20/2014 1140    BMET    Component Value Date/Time   NA 141 08/27/2014 1218   K 3.8 08/27/2014 1218   CL 104 08/27/2014 1218   CO2 29 08/27/2014 1218   GLUCOSE 94 08/27/2014 1218   BUN 17 08/27/2014 1218    CREATININE 0.79 08/27/2014 1218   CALCIUM 9.1 08/27/2014 1218   GFRNONAA >60 08/27/2014 1218   GFRAA >60 08/27/2014 1218          Assessment & Plan:  Perinephric abscess, MSSA bacteremia with new onset right leg numbness: She has been complaining of new numbness when she walks that is increasingly getting worse. She also has increasing back pain that is limiting her strength. This new onset of symptoms is concerning for infection of the spine.  - Will admit her to [facility], will need Neuro consult, MRI of spine and blood cultures. Does not have any implanted hardware.  - She may also need to have her PICC line removed.

## 2014-10-01 NOTE — Progress Notes (Signed)
Assessed right upper arm PICC line, site with some pink noted. Flushed well with good blood return. Dressing clean, dry and intact. Noted that 2 cm out at exit site, when placed was documented that 4 cm out at exit site. RN aware and will inform MD. Will continue to monitor.

## 2014-10-01 NOTE — Progress Notes (Signed)
Patient ID: Deanna Ball, female   DOB: 02-16-1961, 54 y.o.   MRN: [id number] Came to see [name] after i was called by dr Sharin Mons. 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 Spoke with her mother. Patient is NOT in her room. Io will see her in am

## 2014-10-02 ENCOUNTER — Observation Stay (HOSPITAL_COMMUNITY): Payer: Medicare Other

## 2014-10-02 ENCOUNTER — Encounter (HOSPITAL_COMMUNITY): Payer: Self-pay | Admitting: General Practice

## 2014-10-02 DIAGNOSIS — N135 Crossing vessel and stricture of ureter without hydronephrosis: Secondary | ICD-10-CM

## 2014-10-02 DIAGNOSIS — M549 Dorsalgia, unspecified: Secondary | ICD-10-CM | POA: Diagnosis present

## 2014-10-02 DIAGNOSIS — K6812 Psoas muscle abscess: Secondary | ICD-10-CM | POA: Diagnosis not present

## 2014-10-02 DIAGNOSIS — B9561 Methicillin susceptible Staphylococcus aureus infection as the cause of diseases classified elsewhere: Secondary | ICD-10-CM

## 2014-10-02 DIAGNOSIS — M7138 Other bursal cyst, other site: Secondary | ICD-10-CM

## 2014-10-02 DIAGNOSIS — Z959 Presence of cardiac and vascular implant and graft, unspecified: Secondary | ICD-10-CM

## 2014-10-02 DIAGNOSIS — M25551 Pain in right hip: Secondary | ICD-10-CM

## 2014-10-02 DIAGNOSIS — N151 Renal and perinephric abscess: Secondary | ICD-10-CM | POA: Diagnosis not present

## 2014-10-02 DIAGNOSIS — L538 Other specified erythematous conditions: Secondary | ICD-10-CM

## 2014-10-02 DIAGNOSIS — R200 Anesthesia of skin: Secondary | ICD-10-CM

## 2014-10-02 DIAGNOSIS — M47896 Other spondylosis, lumbar region: Secondary | ICD-10-CM

## 2014-10-02 DIAGNOSIS — M5136 Other intervertebral disc degeneration, lumbar region: Secondary | ICD-10-CM | POA: Diagnosis not present

## 2014-10-02 LAB — BASIC METABOLIC PANEL
Anion gap: 7 (ref 5–15)
BUN: 11 mg/dL (ref 6–20)
CO2: 27 mmol/L (ref 22–32)
Calcium: 8.9 mg/dL (ref 8.9–10.3)
Chloride: 105 mmol/L (ref 101–111)
Creatinine, Ser: 0.61 mg/dL (ref 0.44–1.00)
GFR calc Af Amer: >60 mL/min (ref >60)
GFR calc non Af Amer: >60 mL/min (ref >60)
Glucose, Bld: 91 mg/dL (ref 65–99)
POTASSIUM: 3.7 mmol/L (ref 3.5–5.1)
Sodium: 139 mmol/L (ref 135–145)

## 2014-10-02 LAB — URINALYSIS, ROUTINE W REFLEX MICROSCOPIC
Bilirubin Urine: NEGATIVE
Glucose, UA: NEGATIVE mg/dL
HGB URINE DIPSTICK: NEGATIVE
Ketones, ur: NEGATIVE mg/dL
Leukocytes, UA: NEGATIVE
NITRITE: NEGATIVE
PH: 6 (ref 5.0–8.0)
Protein, ur: NEGATIVE mg/dL
SPECIFIC GRAVITY, URINE: 1.011 (ref 1.005–1.030)
UROBILINOGEN UA: 0.2 mg/dL (ref 0.0–1.0)

## 2014-10-02 LAB — MRSA PCR SCREENING: MRSA by PCR: NEGATIVE

## 2014-10-02 LAB — CBC
HCT: 36.4 % (ref 36.0–46.0)
Hemoglobin: 11.8 g/dL — ABNORMAL LOW (ref 12.0–15.0)
MCH: 32.7 pg (ref 26.0–34.0)
MCHC: 32.4 g/dL (ref 30.0–36.0)
MCV: 100.8 fL — ABNORMAL HIGH (ref 78.0–100.0)
Platelets: 266 10*3/uL (ref 150–400)
RBC: 3.61 MIL/uL — AB (ref 3.87–5.11)
RDW: 12.4 % (ref 11.5–15.5)
WBC: 4.3 10*3/uL (ref 4.0–10.5)

## 2014-10-02 MED ORDER — METHOTREXATE 2.5 MG PO TABS: 25.0000 mg | ORAL_TABLET | ORAL | Status: DC

## 2014-10-02 MED ORDER — HEPARIN SOD (PORK) LOCK FLUSH 100 UNIT/ML IV SOLN
250.0000 [IU] | INTRAVENOUS | Status: DC | PRN
  Administered 2014-10-02: 250 [IU]
  Filled 2014-10-02 (×2): qty 3

## 2014-10-02 MED ORDER — HEPARIN SOD (PORK) LOCK FLUSH 100 UNIT/ML IV SOLN
250.0000 [IU] | Freq: Every day | INTRAVENOUS | Status: DC
  Administered 2014-10-02: 250 [IU]

## 2014-10-02 NOTE — Discharge Summary (Signed)
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 Active Problems:   Perinephric abscess present prior to admission   Lupus   Rheumatoid arthritis   Anxiety   Alcohol abuse   Psoas abscess   Bacteremia due to Staphylococcus aureus, present prior to admission   Physical deconditioning  Discharge Condition: Stable  Diet recommendation: General  Filed Weights   10/02/14 0800  Weight: 72.576 kg (160 lb)    History of present illness/Hospital Course:  54 year old white female with recent admission for methicillin sensitive staph aureus and perinephric abscess with extension into so as muscle, currently residing at skilled nursing facility for IV antibiotics. She was seen in the infectious disease office and had complaints of right leg weakness and paresthesias. She was sent to the hospital for concern about lumbar infection. MRI showed no infection but did show degenerative disc disease with large left synovial cyst, unlikely due to be infection. Neurosurgery consulted and did not recommend intervention at this time, but will see patient in the office in 3 weeks. Dr. Jeral Fruit will consider surgery in the future once infectious issues resolved. He noted no weakness on physical examination. Patient's antibiotics were continued in house. Chest x-ray for PICC placement looked fine to my reading. Official report is pending. No evidence of PICC line infection and it is functioning fine. This can be continued but continue to watch insertion site to make sure no developing infection. Stable for discharge and will go back to skilled nursing for completion of IV antibiotics.     Procedures: None  Consultations:  Neurosurgery: [facility]  Discharge Exam: Filed Vitals:   10/02/14 0542  BP: 108/65  Pulse: 65  Temp: 97.7 F (36.5 C)  Resp: 20    General: Anxious, hyperventilating and tearful. Easily consoled and became calmer. Cardiovascular: Regular rate rhythm without murmurs gallops rubs Respiratory: Clear to auscultation bilaterally without wheezes rhonchi or rales extremities: Right arm PICC line with minimal surrounding erythema noted purulence.  Discharge Instructions    Current Discharge Medication List    CONTINUE these medications which have CHANGED   Details  methotrexate (RHEUMATREX) 2.5 MG tablet Take 10 tablets (25 mg total) by mouth 2 (two) times a week. Caution:Chemotherapy. Protect from light. Qty: 4 tablet, Refills: 0      CONTINUE these medications which have NOT CHANGED   Details  ALPRAZolam (XANAX) 1 MG tablet Take 1 tablet (1 mg total) by mouth 2 (two) times daily. Qty: 15 tablet, Refills: 0    ceFAZolin (ANCEF) 2-3 GM-% SOLR Inject 50 mLs (2 g total) into the vein every 8 (eight) hours.    diphenhydrAMINE (SOMINEX) 25 MG tablet Take 25 mg by mouth 2 (two) times daily. 6am and at 10pm with antibiotics    folic acid (FOLVITE) 1 MG tablet Take 1 tablet (1 mg total) by mouth daily.    NEXIUM 40 MG capsule Take 40 mg by mouth daily.     oxyCODONE (OXY IR/ROXICODONE) 5 MG immediate release tablet Take 1-2 tablets (5-10 mg total) by mouth every 4 (four) hours as needed for moderate pain. Qty: 15 tablet, Refills: 0    polyethylene glycol (MIRALAX / GLYCOLAX) packet Take 17 g by mouth daily. Qty: 14 each, Refills: 0  predniSONE (DELTASONE) 2.5 MG tablet Take 2.5 mg by mouth 2 (two) times daily with a meal.    senna (SENOKOT) 8.6 MG TABS tablet Take 2 tablets (17.2 mg total) by mouth at bedtime. Qty: 120 each, Refills: 0    thiamine 100 MG tablet Take 1 tablet (100 mg total) by mouth daily.    vitamin B-12  (CYANOCOBALAMIN) 100 MCG tablet Take 100 mcg by mouth daily.    Vitamin D, Ergocalciferol, (DRISDOL) 50000 UNITS CAPS capsule Take 50,000 Units by mouth every 7 (seven) days.      STOP taking these medications     HYDROcodone-acetaminophen (NORCO/VICODIN) 5-325 MG per tablet        No Known Allergies Follow-up Information    Follow up with Karn Cassis, MD In 3 weeks.   Specialty:  Neurosurgery   Contact information:   [address] [place] [address] [phone]        The results of significant diagnostics from this hospitalization (including imaging, microbiology, ancillary and laboratory) are listed below for reference.    Significant Diagnostic Studies: Mr Lumbar Spine W Wo Contrast  10/01/2014   CLINICAL DATA:  Right lower extremity and hip pain with right leg and foot numbness. Infectious bacteremia and so S and perinephric abscess.  EXAM: MRI LUMBAR SPINE WITHOUT AND WITH CONTRAST  TECHNIQUE: Multiplanar and multiecho pulse sequences of the lumbar spine were obtained without and with intravenous contrast.  CONTRAST:  15mL MULTIHANCE GADOBENATE DIMEGLUMINE 529 MG/ML IV SOLN  COMPARISON:  CT 08/27/2014.  MRI 08/22/2014.  FINDINGS: There appears to be successful treatment of the right renal all an so S inflammatory disease. No evidence of residual edema or any fluid collection in that region.  There is no significant finding at L 3 for or above. No disc herniation. No stenosis or neural compression.  L4-5: There is advanced bilateral facet arthropathy with a gaping, fluid-filled facet joint on the left. No malalignment, though anterolisthesis could occur with standing or flexion. The patient has developed a synovial cyst projecting inward from the facet on the left that appears to have some mass effect upon the left side of the thecal sac. The disc is degenerated in shows circumferential protrusion with a focal disc fragment in the left extra foraminal region  that could focally affect the left L4 nerve root. There is some enhancement of the facet joints in of the vertebral endplates, most consistent with degenerative enhancement. There is no finding at this level to specifically allow diagnosis of spinal infection.  L5-S1: Previous decompression and fusion procedure. No change with sufficient patency of the canal and foramina at this level.  IMPRESSION: No evidence of ongoing inflammation in the right retroperitoneum/ psoas muscle region.  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 Gaping, fluid-filled facet joints, left more than right. New development of a synovial cyst measuring 10-12 mm in size projecting into the canal from the facet joint on the left. This encroaches upon the thecal sac on the left and could affect the left L5 nerve root, but does not appear to affect the right side. This appearance could worsen with standing or flexion, as anterolisthesis may occur based on the appearance of the facet joints. There is enhancement of the facet joints, of the synovial cyst and of the vertebral endplates, all of which is most likely degenerative. Early infection could not be excluded but is not favored based on the imaging.   Electronically Signed   By: Scherrie Bateman.D.  On: 10/01/2014 19:12   Dg Lumb Spine Flex&ext Only  10/01/2014   CLINICAL DATA:  54 year old female with persistent lower back pain over the past week  EXAM: LUMBAR SPINE FLEX AND EXTEND ONLY - 2-3 VIEW  COMPARISON:  Lumbar spine MRI obtained earlier today  FINDINGS: Lateral views of the spine in flexion and extension demonstrate grade 1 anterolisthesis of L5 on S1 without evidence of pathologic motion. Very mild grade 1 anterolisthesis of L4 on L5 is also stable. Lower lumbar facet arthropathy. No evidence of acute fracture or malalignment.  IMPRESSION: Grade 1 anterolisthesis of L4 on L5 and grade 1-2 anterolisthesis of L5 on S1 without evidence of pathologic motion on flexion or extension.    Electronically Signed   By: Malachy Moan M.D.   On: 10/01/2014 19:52   Ct Hip Right Wo Contrast  10/01/2014   CLINICAL DATA:  Acute right hip pain.  EXAM: CT OF THE RIGHT HIP WITHOUT CONTRAST  TECHNIQUE: Multidetector CT imaging of the right hip was performed according to the standard protocol. Multiplanar CT image reconstructions were also generated.  COMPARISON:  None.  FINDINGS: No acute fracture or dislocation. No lytic or sclerotic osseous lesion. Joint spaces are maintained. There is no bone destruction or periosteal reaction.  The right SI joint is unremarkable. There is grade 1 anterolisthesis of L5 on S1 secondary to severe bilateral facet arthropathy. There is degenerative disc disease with disc height loss at L4-5 and L5-S1. There is moderate facet arthropathy at L4-5. There is left foraminal stenosis at L4-5. There is a broad-based disc bulge at L4-5.  There is no evidence of a right psoas abscess. There is no focal fluid collection or hematoma. The muscles are normal.  IMPRESSION: 1. No acute osseous injury of the right hip. No periarticular focal fluid collection to suggest an abscess.   Electronically Signed   By: Elige Ko   On: 10/01/2014 20:21    Microbiology: Recent Results (from the past 240 hour(s))  MRSA PCR Screening     Status: None   Collection Time: 10/01/14 10:34 PM  Result Value Ref Range Status   MRSA by PCR NEGATIVE NEGATIVE Final    Comment:        The GeneXpert MRSA Assay (FDA approved for NASAL specimens only), is one component of a comprehensive MRSA colonization surveillance program. It is not intended to diagnose MRSA infection nor to guide or monitor treatment for MRSA infections.      Labs: Basic Metabolic Panel:  Recent Labs Lab 10/01/14 1700 10/02/14 0440  NA 138 139  K 4.0 3.7  CL 102 105  CO2 28 27  GLUCOSE 90 91  BUN 10 11  CREATININE 0.77 0.61  CALCIUM 9.6 8.9   Liver Function Tests:  Recent Labs Lab 10/01/14 1700  AST  33  ALT 10*  ALKPHOS 98  BILITOT 0.5  PROT 8.0  ALBUMIN 4.0   No results for input(s): LIPASE, AMYLASE in the last 168 hours. No results for input(s): AMMONIA in the last 168 hours. CBC:  Recent Labs Lab 10/01/14 1700 10/02/14 0440  WBC 6.1 4.3  HGB 12.8 11.8*  HCT 39.4 36.4  MCV 99.7 100.8*  PLT 288 266   Cardiac Enzymes: No results for input(s): CKTOTAL, CKMB, CKMBINDEX, TROPONINI in the last 168 hours. BNP: BNP (last 3 results) No results for input(s): BNP in the last 8760 hours.  ProBNP (last 3 results) No results for input(s): PROBNP in the last 8760 hours.  CBG: No results for input(s): GLUCAP in the last 168 hours.     SignedChristiane Ha  [facility] 10/02/2014, 12:47 PM

## 2014-10-02 NOTE — Progress Notes (Signed)
Orthopedic Tech Progress Note Patient Details:  Deanna Ball 07/15/1960 [id number]  Patient ID: Deanna Ball, female   DOB: 05/24/1960, 54 y.o.   MRN: [id number] Called in bio-tech brace order; spoke with Richardean Chimera, Tashi Band 10/02/2014, 11:23 AM

## 2014-10-02 NOTE — Clinical Social Work Note (Signed)
Clinical Social Work Assessment  Patient Details  Name: Deanna Ball MRN: [id number] Date of Birth: [birth year]  Date of referral:  10/02/14               Reason for consult:  Facility Placement                Housing/Transportation Living arrangements for the past 2 months:  [place] of Information:  Patient Patient Interpreter Needed:  None Criminal Activity/Legal Involvement Pertinent to Current Situation/Hospitalization:  No - Comment as needed Significant Relationships:  Parents Lives with:  Self Do you feel safe going back to the place where you live?  No Need for family participation in patient care:  Yes (Comment)  Care giving concerns:  NA   Social Worker assessment / plan:  CSW met the pt and the pt's mother Deanna Ball at bedside. CSW introduced self and purpose of the visit. Pt confirmed that she was at [facility] prior to this admission. CSW and pt discussed being discharge back to [facility] today. Deanna Ball reported that she will transport the pt back to [facility]. CSW answered all questions in which the pt inquired about. CSW will sign off.   Employment status:  Unemployed Nurse, adult PT Recommendations:  Not assessed at this time Information / Referral to community resources:   (NA)  Patient/Family's Response to care:  Deanna Ball reported that the care in which the pt has received has been well.   Patient/Family's Understanding of and Emotional Response to Diagnosis, Current Treatment, and Prognosis:  Pt acknowledged her current diagnosis. Pt explained that due to her diagnosis she agreed to go to SNF.   Emotional Assessment Appearance:  Appears stated age Attitude/Demeanor/Rapport:  Reactive Affect (typically observed):  Irritable Orientation:  Oriented to Self, Oriented to Place, Oriented to  Time, Oriented to Situation Alcohol / Substance use:  Not Applicable Psych involvement (Current and /or in the  community):  No (Comment)  Discharge Needs  Concerns to be addressed:  Denies Needs/Concerns at this time Readmission within the last 30 days:  No Current discharge risk:  None Barriers to Discharge:  No Barriers Identified   Deanna Michaelsen, LCSW 10/02/2014, 4:20 PM

## 2014-10-02 NOTE — Progress Notes (Signed)
Patient ID: Deanna Ball, female   DOB: [birth year], 54 y.o.   MRN: [id number] Stable. To see me in my office in 3 weeks

## 2014-10-02 NOTE — Progress Notes (Signed)
[facility]    Date of Admission:  10/01/2014   Total days of antibiotics: 56 days (8 weeks) End date: 10/15/14        Day 43 of IV Ancef          Principal Problem:   Right leg weakness Active Problems:   Perinephric abscess   Lupus   Rheumatoid arthritis   Anxiety   Alcohol abuse   Psoas abscess   Acute right hip pain   Bacteremia due to Staphylococcus aureus   Physical deconditioning   Back pain   . ALPRAZolam  1 mg Oral BID  . ceFAZolin  2 g Intravenous 3 times per day  . folic acid  1 mg Oral Daily  . heparin  5,000 Units Subcutaneous 3 times per day  . methotrexate  25 mg Oral Once per day on Mon Thu  . pantoprazole  40 mg Oral Daily  . polyethylene glycol  17 g Oral Daily  . predniSONE  2.5 mg Oral BID WC  . senna  2 tablet Oral QHS    Subjective: Mrs. 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 TEE done while in hospital was negative for vegetation. A nephrostomy tube and drain was placed during her hospitalization and D/C'd prior to sending her to a SNF. She was sent to the SNF with a RUE PICC for an eight week course of IV Cefazolin. End date for IV Cefazolin is 10/15/14. She presented to the ID outpatient clinic on 10/01/14 to see Dr. Algis Liming for routine follow up. At her visit, she complained of increasing numbness to her leg and foot when she walks which resolves at rest. She has had low intermittent fevers over the past week that have peaked around 100F. She does feel chilled at times. Her mother was also concerned about her PICC line stating that her skin is getting red and may have pus coming out from the insertion site. She was direct admitted to [facility] to r/o infection of spine due to the location of her perinephric abscess. An MRI of spine and neuro consult was  ordered due to the development of these new symptoms. MRI impression shows no evidence of ongoing inflammation in the right retroperitoneum/psoas muscle region, advanced degenerative disease at L4-5, gaping, fluid-filled facet joints (left more than right) and new development of a synovial cyst measuring 10-12 mm in size projecting into the canal from the facet joint on the left. Dr. Jeral Fruit consulted and felt that she is not a surgical candidate at this time. He recommended a lumbar brace to help relieve her neuro symptoms. He plans to follow up with her in three weeks. She has remained afebrile throughout this hospitalization and feels that she is ready to go back to the SNF to continue rehab and IV Ancef treatment.   Review of Systems: ROS negative except: Integumentary: No warmth, pain or drainage at RUE PICC site.   Musculoskeletal: Chronic back pain and muscle spasms r/t degenerative disk disease and recent removal of nephrostomy tube.  Neurological: Numbness and weakness with hip flexion on right leg. Denies numbness since she has not been getting up and walking.   Past Medical History  Diagnosis Date  . Lupus   . Rheumatoid arthritis   . Shortness  of breath dyspnea   . Anxiety   . Kidney abscess 08/2014  . GERD (gastroesophageal reflux disease)   . Synovial cyst     History  Substance Use Topics  . Smoking status: Former Smoker -- 0.50 packs/day for 45 years    Types: Cigarettes    Quit date: 09/02/2014  . Smokeless tobacco: Never Used  . Alcohol Use: 0.0 oz/week    0 Standard drinks or equivalent per week     Comment: patient drinks 2 bottles of wild irish rose wine last drink 4 days ago   09/2014 I have not drank in 2 months "    Family History  Problem Relation Age of Onset  . Lupus Maternal Uncle    No Known Allergies  OBJECTIVE: Blood pressure 108/65, pulse 65, temperature 97.7 F (36.5 C), temperature source Oral, resp. rate 20, height 5\' 2"  (1.575 m), weight 72.576  kg (160 lb), SpO2 100 %. General: Eager to leave hospital setting. NAD.  Skin: Mild erythema at PICC insertion site. No warmth or drainage noted.  Musculoskeletal: Strength limited to pain in right leg.   Lab Results Lab Results  Component Value Date   WBC 4.3 10/02/2014   HGB 11.8* 10/02/2014   HCT 36.4 10/02/2014   MCV 100.8* 10/02/2014   PLT 266 10/02/2014    Lab Results  Component Value Date   CREATININE 0.61 10/02/2014   BUN 11 10/02/2014   NA 139 10/02/2014   K 3.7 10/02/2014   CL 105 10/02/2014   CO2 27 10/02/2014    Lab Results  Component Value Date   ALT 10* 10/01/2014   AST 33 10/01/2014   ALKPHOS 98 10/01/2014   BILITOT 0.5 10/01/2014    Erythrocyte Sedimentation Rate     Component Value Date/Time   ESRSEDRATE 33* 10/01/2014 1700    C-reactive protein 10/01/14: <0.5  Microbiology: Recent Results (from the past 240 hour(s))  MRSA PCR Screening     Status: None   Collection Time: 10/01/14 10:34 PM  Result Value Ref Range Status   MRSA by PCR NEGATIVE NEGATIVE Final    Comment:        The GeneXpert MRSA Assay (FDA approved for NASAL specimens only), is one component of a comprehensive MRSA colonization surveillance program. It is not intended to diagnose MRSA infection nor to guide or monitor treatment for MRSA infections.    MRI spine 10/01/14: IMPRESSION: No evidence of ongoing inflammation in the right retroperitoneum/psoas muscle region. 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 Gaping, fluid-filled facet joints, left more than right. New development of a synovial cyst measuring 10-12 mm in size projecting into the canal from the facet joint on the left. This encroaches upon the thecal sac on the left and could affect the left L5 nerve root, but does not appear to affect the right side. This appearance could worsen with standing or flexion, as anterolisthesis may occur based on the appearance of the facet joints. There is enhancement of the facet joints,  of the synovial cyst and of the vertebral endplates, all of which is most likely degenerative. Early infection could not be excluded but is not favored based on the imaging.   Blood and urine cultures 10/01/14: Pending  Assessment: 1) Right leg numbness: MRI findings show degenerative joint disease and development of new synovial cyst. Impression states this is unlikely to be related to infection. She is not a surgical candidate per Neurosurgery consult.  2) Erythema of RUE PICC: Although mildly erythematous, does  not produce drainage and has not caused her any pain.  3) Perinephric abscess extending to psoas muscle and ureteral obstruction due to MSSA: MRI shows resolution of abscess. Has been afebrile through this hospitalization. Continues on her course of IV Ancef.   Plan: 1) Right leg numbness: - Appreciate Neurosurgery consultation and rec's. She will F/U with Dr. Jeral Fruit in clinic in 3 weeks. In the meantime, she will wear lumbar brace to help alleviate her symptoms of numbness and weakness to her right leg.  2) RUE PICC erythema: - Does not appear to be infected and therefore will keep PICC in place until she finished her course of IV Ancef.  3) Perinephric abscess extending to psoas muscle and ureteral obstruction due to MSSA: - Continue current eight week course of IV Ancef with end date planned 10/15/14.  - Will have her follow up in ID clinic with Dr. Algis Liming in two weeks.  Alphonsa Overall, NP [facility] for Infectious [facility] 10/02/2014, 10:33 AM   Addendum: I have seen and examined Ms. Deanna Ball with Kathryne Gin, NP. She is currently about 6 weeks into therapy for MSSA bacteremia complicating a right psoas/perinephric abscess. She was readmitted to the hospital yesterday after my partner, Dr. Daiva Eves, saw her in clinic and obtained a history that she had had recent onset of numbness in her right leg while walking. MRI was obtained which shows  a new synovial cyst in the left facet joint at the L4-5 level. She's been evaluated by neurosurgery who feels no intervention is needed at this time. She is afebrile and very eager to go home. She is tolerating her PICC and cefazolin. We will continue current therapy and have her followup in clinic in about 2 weeks.  Cliffton Asters, MD [facility] for Infectious [facility] [phone] pager   [phone] cell 10/02/2014, 4:51 PM

## 2014-10-03 LAB — URINE CULTURE

## 2014-10-06 LAB — CULTURE, BLOOD (ROUTINE X 2)
Culture: NO GROWTH
Culture: NO GROWTH

## 2014-10-19 ENCOUNTER — Emergency Department (HOSPITAL_COMMUNITY)
Admission: EM | Admit: 2014-10-19 | Discharge: 2014-10-19 | Disposition: A | Discharge status: Home / Self Care | Payer: Medicare Other | Attending: Emergency Medicine | Admitting: Emergency Medicine

## 2014-10-19 ENCOUNTER — Encounter (HOSPITAL_COMMUNITY): Payer: Self-pay | Admitting: Emergency Medicine

## 2014-10-19 DIAGNOSIS — Z79899 Other long term (current) drug therapy: Secondary | ICD-10-CM | POA: Insufficient documentation

## 2014-10-19 DIAGNOSIS — M069 Rheumatoid arthritis, unspecified: Secondary | ICD-10-CM | POA: Insufficient documentation

## 2014-10-19 DIAGNOSIS — Y9289 Other specified places as the place of occurrence of the external cause: Secondary | ICD-10-CM | POA: Diagnosis not present

## 2014-10-19 DIAGNOSIS — L089 Local infection of the skin and subcutaneous tissue, unspecified: Secondary | ICD-10-CM | POA: Insufficient documentation

## 2014-10-19 DIAGNOSIS — Y9389 Activity, other specified: Secondary | ICD-10-CM | POA: Insufficient documentation

## 2014-10-19 DIAGNOSIS — F419 Anxiety disorder, unspecified: Secondary | ICD-10-CM | POA: Diagnosis not present

## 2014-10-19 DIAGNOSIS — K219 Gastro-esophageal reflux disease without esophagitis: Secondary | ICD-10-CM | POA: Insufficient documentation

## 2014-10-19 DIAGNOSIS — S51852A Open bite of left forearm, initial encounter: Secondary | ICD-10-CM

## 2014-10-19 DIAGNOSIS — S51832A Puncture wound without foreign body of left forearm, initial encounter: Secondary | ICD-10-CM | POA: Insufficient documentation

## 2014-10-19 DIAGNOSIS — M329 Systemic lupus erythematosus, unspecified: Secondary | ICD-10-CM | POA: Insufficient documentation

## 2014-10-19 DIAGNOSIS — Z87448 Personal history of other diseases of urinary system: Secondary | ICD-10-CM | POA: Diagnosis not present

## 2014-10-19 DIAGNOSIS — Y998 Other external cause status: Secondary | ICD-10-CM | POA: Insufficient documentation

## 2014-10-19 DIAGNOSIS — Z7952 Long term (current) use of systemic steroids: Secondary | ICD-10-CM | POA: Diagnosis not present

## 2014-10-19 DIAGNOSIS — Z87891 Personal history of nicotine dependence: Secondary | ICD-10-CM | POA: Insufficient documentation

## 2014-10-19 DIAGNOSIS — D849 Immunodeficiency, unspecified: Secondary | ICD-10-CM | POA: Diagnosis not present

## 2014-10-19 DIAGNOSIS — D899 Disorder involving the immune mechanism, unspecified: Secondary | ICD-10-CM

## 2014-10-19 DIAGNOSIS — W5501XA Bitten by cat, initial encounter: Secondary | ICD-10-CM | POA: Diagnosis not present

## 2014-10-19 DIAGNOSIS — S59912A Unspecified injury of left forearm, initial encounter: Secondary | ICD-10-CM | POA: Diagnosis present

## 2014-10-19 MED ORDER — OXYCODONE-ACETAMINOPHEN 5-325 MG PO TABS
1.0000 | ORAL_TABLET | Freq: Once | ORAL | Status: AC
  Administered 2014-10-19: 1 via ORAL
  Filled 2014-10-19: qty 1

## 2014-10-19 MED ORDER — AMOXICILLIN-POT CLAVULANATE 875-125 MG PO TABS: 1.0000 | ORAL_TABLET | Freq: Two times a day (BID) | ORAL | Status: DC

## 2014-10-19 MED ORDER — SODIUM CHLORIDE 0.9 % IV SOLN
3.0000 g | Freq: Once | INTRAVENOUS | Status: AC
  Administered 2014-10-19: 3 g via INTRAVENOUS
  Filled 2014-10-19: qty 3

## 2014-10-19 MED ORDER — OXYCODONE-ACETAMINOPHEN 5-325 MG PO TABS: 1.0000 | ORAL_TABLET | Freq: Four times a day (QID) | ORAL | Status: DC | PRN

## 2014-10-19 NOTE — ED Provider Notes (Signed)
CSN: [id number]     Arrival date & time 10/19/14  1926 History   First MD Initiated Contact with Patient 10/19/14 1939     Chief Complaint  Patient presents with  . Animal Bite     (Consider location/radiation/quality/duration/timing/severity/associated sxs/prior Treatment) Patient is a 54 y.o. female presenting with animal bite. The history is provided by the patient.  Animal Bite Contact animal:  Cat Location:  Shoulder/arm Shoulder/arm injury location:  L forearm Time since incident:  1 day Pain details:    Quality:  Aching   Severity:  Mild   Timing:  Constant   Progression:  Unchanged Provoked: unprovoked   Notifications:  None Animal in possession: yes   Relieved by:  Nothing Worsened by:  Nothing tried Ineffective treatments:  None tried   Past Medical History  Diagnosis Date  . Lupus   . Rheumatoid arthritis   . Shortness of breath dyspnea   . Anxiety   . Kidney abscess 08/2014  . GERD (gastroesophageal reflux disease)   . Synovial cyst    Past Surgical History  Procedure Laterality Date  . Hernia repair    . Appendectomy    . Lumbar fusion    . Kidney stent placement    . Tee without cardioversion N/A 08/25/2014    Procedure: TRANSESOPHAGEAL ECHOCARDIOGRAM (TEE);  Surgeon: Thurmon Fair, MD;  Location: [facility];  Service: Cardiovascular;  Laterality: N/A;  . Peripherally inserted central catheter insertion  08/2014   Family History  Problem Relation Age of Onset  . Lupus Maternal Uncle    History  Substance Use Topics  . Smoking status: Former Smoker -- 0.50 packs/day for 45 years    Types: Cigarettes    Quit date: 09/02/2014  . Smokeless tobacco: Never Used  . Alcohol Use: 0.0 oz/week    0 Standard drinks or equivalent per week     Comment: patient drinks 2 bottles of wild [place] rose wine last drink 4 days ago   09/2014 I have not drank in 2 months "   OB History    No data available     Review of Systems  Skin: Positive for wound.  All  other systems reviewed and are negative.     Allergies  Review of patient's allergies indicates no known allergies.  Home Medications   Prior to Admission medications   Medication Sig Start Date End Date Taking? Authorizing Provider  ALPRAZolam Prudy Feeler) 1 MG tablet Take 1 tablet (1 mg total) by mouth 2 (two) times daily. 08/29/14  Yes Kathlen Mody, MD  diphenhydrAMINE (BENADRYL) 25 MG tablet Take 25 mg by mouth every 6 (six) hours as needed for allergies or sleep.   Yes Historical Provider, MD  docusate sodium (COLACE) 100 MG capsule Take 100 mg by mouth daily as needed for mild constipation.   Yes Historical Provider, MD  folic acid (FOLVITE) 1 MG tablet Take 1 tablet (1 mg total) by mouth daily. 08/29/14  Yes Kathlen Mody, MD  NEXIUM 40 MG capsule Take 40 mg by mouth daily.  07/17/14  Yes Historical Provider, MD  predniSONE (DELTASONE) 2.5 MG tablet Take 2.5 mg by mouth 2 (two) times daily with a meal.   Yes Historical Provider, MD  senna (SENOKOT) 8.6 MG TABS tablet Take 2 tablets (17.2 mg total) by mouth at bedtime. 08/29/14  Yes Kathlen Mody, MD  thiamine 100 MG tablet Take 1 tablet (100 mg total) by mouth daily. 08/29/14  Yes Kathlen Mody, MD  vitamin B-12 (CYANOCOBALAMIN)  100 MCG tablet Take 100 mcg by mouth daily.   Yes Historical Provider, MD  Vitamin D, Ergocalciferol, (DRISDOL) 50000 UNITS CAPS capsule Take 50,000 Units by mouth every 7 (seven) days.   Yes Historical Provider, MD  methotrexate (RHEUMATREX) 2.5 MG tablet Take 10 tablets (25 mg total) by mouth 2 (two) times a week. Caution:Chemotherapy. Protect from light. Patient taking differently: Take 2.5 mg by mouth 2 (two) times a week. Caution:Chemotherapy. Protect from light. Takes on Monday and Friday 10/02/14   Christiane Ha, MD  oxyCODONE (OXY IR/ROXICODONE) 5 MG immediate release tablet Take 1-2 tablets (5-10 mg total) by mouth every 4 (four) hours as needed for moderate pain. 08/29/14   Kathlen Mody, MD  polyethylene  glycol (MIRALAX / GLYCOLAX) packet Take 17 g by mouth daily. 08/29/14   Kathlen Mody, MD   BP 155/78 mmHg  Pulse 103  Temp(Src) 98.8 F (37.1 C) (Oral)  Resp 20  Ht 5' 2.5" (1.588 m)  Wt 158 lb (71.668 kg)  BMI 28.42 kg/m2  SpO2 99% Physical Exam  Constitutional: She is oriented to person, place, and time. She appears well-developed and well-nourished. No distress.  HENT:  Head: Normocephalic.  Eyes: Conjunctivae are normal.  Neck: Neck supple. No tracheal deviation present.  Cardiovascular: Normal rate and regular rhythm.   Pulmonary/Chest: Effort normal. No respiratory distress.  Abdominal: Soft. She exhibits no distension.  Neurological: She is alert and oriented to person, place, and time.  Skin: Skin is warm and dry. Lesion (multiple puncture wounds over left forearm, excluding tendons, overlying erythema and induration roughly 3 cm in diameter extending from lesions in largest dimension) noted.  Psychiatric: She has a normal mood and affect.    ED Course  Procedures (including critical care time) Emergency Focused Ultrasound Exam Limited Ultrasound of Soft Tissue   Performed and interpreted by Dr. Clydene Pugh Indication: evaluation for infection or foreign body Transverse and Sagittal views of left forearm are obtained in real time for the purposes of evaluation of skin and underlying soft tissues.  Findings: no heterogeneous fluid collection, + hyperemia/edema of surrounding tissue Interpretation: no abscess, + cellulitis Images archived electronically.  CPT Codes:  Upper extremity [id number]  Other soft tissue 98921-19    Labs Review Labs Reviewed - No data to display  Imaging Review No results found.   EKG Interpretation None      MDM   Final diagnoses:  Cat bite of left forearm with infection, initial encounter  Immunosuppressed status    54 year old female presents after her cat bit her on the left forearm last night. She noticed swelling, erythema,  drainage from the bite area and was concerned because she has a PICC line in her right arm and that it might become infected. She does have immunosuppression and recently completed a course of IV antibiotics for a perinephric abscess. At this time the infection appears localized to the forearm without evidence of extension. She has no other systemic symptoms and no fever. She is immunosuppressed on methotrexate and chronic steroids so is high risk for extension of infection. She was given a dose of IV Unasyn in the emergency department to start treating the local bite wound infection. She sees her primary care physician in the morning who will be available to recheck the arm where the border of presumed infection was marked with a permanent marker. She'll be given oral antibiotics for home and depending on clinical progression her primary care physician can arrange outpatient IV antibiotic  therapy through her PICC line or continue oral antibiotics if responding appropriately.     Lyndal Pulley, MD 10/19/14 2149

## 2014-10-19 NOTE — ED Notes (Signed)
Pt. Reports cat bite to left arm. Pt. With PICC line to right arm. Pt. Reports she has been on antibiotics for the past 8 weeks for an abscessed kidney.

## 2014-10-21 MED FILL — Oxycodone w/ Acetaminophen Tab 5-325 MG: ORAL | Qty: 6 | Status: AC

## 2014-11-10 ENCOUNTER — Ambulatory Visit (INDEPENDENT_AMBULATORY_CARE_PROVIDER_SITE_OTHER): Payer: Medicare Other | Admitting: Infectious Disease

## 2014-11-10 ENCOUNTER — Ambulatory Visit
Admission: RE | Admit: 2014-11-10 | Discharge: 2014-11-10 | Disposition: A | Discharge status: Home / Self Care | Payer: Medicare Other | Source: Ambulatory Visit | Attending: Infectious Disease | Admitting: Infectious Disease

## 2014-11-10 ENCOUNTER — Encounter: Payer: Self-pay | Admitting: Infectious Disease

## 2014-11-10 VITALS — BP 166/79 | HR 84 | Temp 98.2°F | Wt 160.5 lb

## 2014-11-10 DIAGNOSIS — A4101 Sepsis due to Methicillin susceptible Staphylococcus aureus: Secondary | ICD-10-CM | POA: Diagnosis not present

## 2014-11-10 DIAGNOSIS — M25471 Effusion, right ankle: Secondary | ICD-10-CM | POA: Insufficient documentation

## 2014-11-10 DIAGNOSIS — K6812 Psoas muscle abscess: Secondary | ICD-10-CM | POA: Diagnosis not present

## 2014-11-10 DIAGNOSIS — W5501XA Bitten by cat, initial encounter: Secondary | ICD-10-CM | POA: Diagnosis not present

## 2014-11-10 DIAGNOSIS — N151 Renal and perinephric abscess: Secondary | ICD-10-CM | POA: Diagnosis not present

## 2014-11-10 HISTORY — DX: Bitten by cat, initial encounter: W55.01XA

## 2014-11-10 HISTORY — DX: Effusion, right ankle: M25.471

## 2014-11-10 NOTE — Progress Notes (Signed)
Per Dr Daiva Eves 42  Single lumen Peripherally Inserted Central Catheter  removed from right basilic . No sutures present. Dressing was clean and dry . Area cleansed with chlorhexidine and petroleum dressing applied. Pt advised no heavy lifting with this arm, leave dressing for 24 hours and call the office if dressing becomes soaked with blood or sharp pain presents.  Pt tolerated procedure well.    Laurell Josephs, RN

## 2014-11-10 NOTE — Progress Notes (Signed)
Subjective:    Patient ID: Deanna Ball, female    DOB: [birth year], 54 y.o.   MRN: [id number]  HPI  Deanna Ball is a 54 y/o female with PMH of SLE with chronic predisone and methotrexate use who was admitted to [facility] on 08/20/14 for perinephric abscess extending to psoas muscle and ureteral obstruction due to MSSA.. TEE done while in hospital was negative for vegetation. A nephrostomy tube and drain was placed during her hospitalization and D/C'd prior to sending her to a SNF. She was sent to the SNF with a RUE PICC for an eight week course of IV Cefazolin. End date for IV ABX iwas 10/15/14.    Prior to last visit she had stated she had noticed increasingly worse numbness when she walks which resolves when she rests. She also has been having pain that has limited the strength to her right leg. She has had low intermittent fevers.  We were VERy worried about potential spine infection but MRI showed instead a synovial cyst and resolution of her perinephric abscess.   Since then she has suffered a cat bite on her left ankle (she had one on her arm in July) and then a mechanical fall on same ankle which is swollen but not tender. She had been to urgent care and rx augmentin bid but she had been taking every 4 hours " because that is how she thought she was supposed to be taking the medicine vs correct rx on the bottle.     Patient Active Problem List   Diagnosis Date Noted  . Back pain 10/02/2014  . Right leg weakness 10/01/2014  . Acute right hip pain 10/01/2014  . Bacteremia due to Staphylococcus aureus 10/01/2014  . Physical deconditioning 10/01/2014  . Right-sided chest pain   . Staphylococcus aureus bacteremia with sepsis   . Abscess   . Ureteral obstruction   . Screening for STD (sexually transmitted disease)   . Psoas abscess   . Perinephric abscess 08/20/2014  . Pyelonephritis 08/20/2014  . Hydronephrosis 08/20/2014  . Lupus 08/20/2014  . Rheumatoid arthritis 08/20/2014  .  Anxiety 08/20/2014  . GERD without esophagitis 08/20/2014  . Alcohol abuse 08/20/2014  . Transaminitis 08/20/2014     Outpatient Encounter Prescriptions as of 11/10/2014  Medication Sig Note  . ALPRAZolam (XANAX) 1 MG tablet Take 1 tablet (1 mg total) by mouth 2 (two) times daily.   Marland Kitchen amoxicillin-clavulanate (AUGMENTIN) 875-125 MG per tablet Take 1 tablet by mouth every 12 (twelve) hours.   . diphenhydrAMINE (BENADRYL) 25 MG tablet Take 25 mg by mouth every 6 (six) hours as needed for allergies or sleep.   Marland Kitchen docusate sodium (COLACE) 100 MG capsule Take 100 mg by mouth daily as needed for mild constipation.   . folic acid (FOLVITE) 1 MG tablet Take 1 tablet (1 mg total) by mouth daily.   . methotrexate (RHEUMATREX) 2.5 MG tablet Take 10 tablets (25 mg total) by mouth 2 (two) times a week. Caution:Chemotherapy. Protect from light. (Patient taking differently: Take 2.5 mg by mouth 2 (two) times a week. Caution:Chemotherapy. Protect from light. Takes on Monday and Friday)   . NEXIUM 40 MG capsule Take 40 mg by mouth daily.    Marland Kitchen oxyCODONE (OXY IR/ROXICODONE) 5 MG immediate release tablet Take 1-2 tablets (5-10 mg total) by mouth every 4 (four) hours as needed for moderate pain.   Marland Kitchen oxyCODONE-acetaminophen (ROXICET) 5-325 MG per tablet Take 1 tablet by mouth every 6 (six) hours as  needed for moderate pain or severe pain.   . polyethylene glycol (MIRALAX / GLYCOLAX) packet Take 17 g by mouth daily.   . predniSONE (DELTASONE) 2.5 MG tablet Take 2.5 mg by mouth 2 (two) times daily with a meal.   . senna (SENOKOT) 8.6 MG TABS tablet Take 2 tablets (17.2 mg total) by mouth at bedtime.   . thiamine 100 MG tablet Take 1 tablet (100 mg total) by mouth daily.   . vitamin B-12 (CYANOCOBALAMIN) 100 MCG tablet Take 100 mcg by mouth daily.   . Vitamin D, Ergocalciferol, (DRISDOL) 50000 UNITS CAPS capsule Take 50,000 Units by mouth every 7 (seven) days. 10/02/2014: Sundays   No facility-administered encounter  medications on file as of 11/10/2014.      Review of Systems  Constitutional: Positive for fever. Negative for chills, diaphoresis, appetite change, fatigue and unexpected weight change.  HENT: Negative.   Eyes: Negative for photophobia and visual disturbance.  Respiratory: Negative for chest tightness and shortness of breath.   Cardiovascular: Negative for chest pain.  Gastrointestinal: Negative for nausea, vomiting, abdominal pain, diarrhea and constipation.  Genitourinary: Negative for dysuria, urgency, hematuria, flank pain, decreased urine volume and difficulty urinating.  Musculoskeletal: Positive for back pain. Negative for myalgias and neck stiffness.  Skin: Negative for rash and wound.  Neurological: Positive for weakness and numbness. Negative for dizziness, syncope, light-headedness and headaches.       States her right leg and foot goes numb when she walks.  Hematological: Negative for adenopathy.  Psychiatric/Behavioral: Negative for behavioral problems, sleep disturbance, decreased concentration and agitation. The patient is not nervous/anxious.        Objective:   Physical Exam  Constitutional: She is oriented to person, place, and time. She appears well-developed and well-nourished.  HENT:  Head: Normocephalic and atraumatic.  Mouth/Throat: No oropharyngeal exudate.  Eyes: Conjunctivae are normal. Pupils are equal, round, and reactive to light.  Neck: Normal range of motion. Neck supple.  Cardiovascular: Normal rate and regular rhythm.   Pulmonary/Chest: Effort normal and breath sounds normal.  Abdominal: Soft. There is no tenderness.  Musculoskeletal: Normal range of motion. She exhibits tenderness.  Lymphadenopathy:    She has no cervical adenopathy.  Neurological: She is alert and oriented to person, place, and time.  4/5 strength to hip flexion, weaker than left side.  Skin: Skin is warm and dry. No rash noted. No erythema.  PICC to RUE, red around insertion  site. No purulent drainage noted.   Psychiatric: She has a normal mood and affect. Her behavior is normal. Judgment and thought content normal.   Right arm PICC line clean 11/10/14      Left ankle 11/10/14:         Blood pressure 125/84, pulse 87, temperature 98.3 F (36.8 C), temperature source Oral, weight 160 lb (72.576 kg).  CBC    Component Value Date/Time   WBC 4.3 10/02/2014 0440   RBC 3.61* 10/02/2014 0440   HGB 11.8* 10/02/2014 0440   HCT 36.4 10/02/2014 0440   PLT 266 10/02/2014 0440   MCV 100.8* 10/02/2014 0440   MCH 32.7 10/02/2014 0440   MCHC 32.4 10/02/2014 0440   RDW 12.4 10/02/2014 0440   LYMPHSABS 1.3 08/20/2014 1140   MONOABS 1.0 08/20/2014 1140   EOSABS 0.0 08/20/2014 1140   BASOSABS 0.0 08/20/2014 1140    BMET    Component Value Date/Time   NA 139 10/02/2014 0440   K 3.7 10/02/2014 0440   CL 105 10/02/2014  0440   CO2 27 10/02/2014 0440   GLUCOSE 91 10/02/2014 0440   BUN 11 10/02/2014 0440   CREATININE 0.61 10/02/2014 0440   CALCIUM 8.9 10/02/2014 0440   GFRNONAA >60 10/02/2014 0440   GFRAA >60 10/02/2014 0440          Assessment & Plan:  Perinephric abscess, MSSA bacteremia with new onset right leg numbness: MRi showed synovial cyst and abscess resolved. Ancef done. DC PICC line  Cat bite and fall on let ankle: --fine with augmentin but BID not Q4 hours --will get plain films of the ankle --if this does not improve with rest, abx will get MRI to look for deep infection due to cat bite  I spent greater than 40 minutes with the patient including greater than 50% of time in face to face counsel of the patient re his MSSA bacteremia, perinephric abscess, and cat bite and swollen ankl and in coordination of their care.

## 2014-11-12 ENCOUNTER — Inpatient Hospital Stay: Payer: Medicare Other | Admitting: Infectious Disease

## 2014-12-15 ENCOUNTER — Encounter: Payer: Self-pay | Admitting: Infectious Disease

## 2014-12-15 ENCOUNTER — Ambulatory Visit (INDEPENDENT_AMBULATORY_CARE_PROVIDER_SITE_OTHER): Payer: Medicare Other | Admitting: Infectious Disease

## 2014-12-15 VITALS — BP 127/80 | HR 69 | Temp 98.3°F | Wt 158.5 lb

## 2014-12-15 DIAGNOSIS — M069 Rheumatoid arthritis, unspecified: Secondary | ICD-10-CM | POA: Diagnosis not present

## 2014-12-15 DIAGNOSIS — K6812 Psoas muscle abscess: Secondary | ICD-10-CM | POA: Diagnosis not present

## 2014-12-15 DIAGNOSIS — N151 Renal and perinephric abscess: Secondary | ICD-10-CM

## 2014-12-15 DIAGNOSIS — A4101 Sepsis due to Methicillin susceptible Staphylococcus aureus: Secondary | ICD-10-CM | POA: Diagnosis not present

## 2014-12-15 DIAGNOSIS — R7881 Bacteremia: Secondary | ICD-10-CM

## 2014-12-15 DIAGNOSIS — W5501XD Bitten by cat, subsequent encounter: Secondary | ICD-10-CM

## 2014-12-15 DIAGNOSIS — B9561 Methicillin susceptible Staphylococcus aureus infection as the cause of diseases classified elsewhere: Secondary | ICD-10-CM

## 2014-12-15 NOTE — Progress Notes (Signed)
Subjective:    Patient ID: Deanna Ball, female    DOB: 07-22-1960, 54 y.o.   MRN: [id number]  HPI  Mrs. Kjos is a 54 y/o female with PMH of SLE with chronic predisone and methotrexate use who was admitted to [facility] on 08/20/14 for perinephric abscess extending to psoas muscle and ureteral obstruction due to MSSA.. TEE done while in hospital was negative for vegetation. A nephrostomy tube and drain was placed during her hospitalization and D/C'd prior to sending her to a SNF. She was sent to the SNF with a RUE PICC for an eight week course of IV Cefazolin. End date for IV ABX iwas 10/15/14.    Several visits ago she had stated she had noticed increasingly worse numbness when she walks which resolves when she rests. She also has been having pain that has limited the strength to her right leg. She has had low intermittent fevers.  We were VERy worried about potential spine infection but MRI showed instead a synovial cyst and resolution of her perinephric abscess.   Since then she had suffered a cat bite on her left ankle (she had one on her arm in July) and then a mechanical fall on same ankle which is swollen but not tender. She had been to urgent care and rx augmentin bid but she had been taking every 4 hours " because that is how she thought she was supposed to be taking the medicine vs correct rx on the bottle. We had her change the augmentin to the correct dose and she finished this and now returns to clinic for follow-up She claims to actually be walking MORE since I last saw her and states that her lower back pain has improved substantially.    Past Medical History  Diagnosis Date  . Lupus   . Rheumatoid arthritis   . Shortness of breath dyspnea   . Anxiety   . Kidney abscess 08/2014  . GERD (gastroesophageal reflux disease)   . Synovial cyst   . Cat bite 11/10/2014  . Swelling of joint, ankle, right 11/10/2014    Past Surgical History  Procedure Laterality Date  . Hernia repair      . Appendectomy    . Lumbar fusion    . Kidney stent placement    . Tee without cardioversion N/A 08/25/2014    Procedure: TRANSESOPHAGEAL ECHOCARDIOGRAM (TEE);  Surgeon: Thurmon Fair, MD;  Location: [facility];  Service: Cardiovascular;  Laterality: N/A;  . Peripherally inserted central catheter insertion  08/2014    Family History  Problem Relation Age of Onset  . Lupus Maternal Uncle       reports that she quit smoking about 3 months ago. Her smoking use included Cigarettes. She has a 22.5 pack-year smoking history. She has never used smokeless tobacco. She reports that she drinks alcohol. She reports that she does not use illicit drugs.   Family History  Problem Relation Age of Onset  . Lupus Maternal Uncle     No Known Allergies   Current outpatient prescriptions:  .  ALPRAZolam (XANAX) 1 MG tablet, Take 1 tablet (1 mg total) by mouth 2 (two) times daily., Disp: 15 tablet, Rfl: 0 .  docusate sodium (COLACE) 100 MG capsule, Take 100 mg by mouth daily as needed for mild constipation., Disp: , Rfl:  .  folic acid (FOLVITE) 1 MG tablet, Take 1 tablet (1 mg total) by mouth daily., Disp: , Rfl:  .  HYDROcodone-acetaminophen (NORCO/VICODIN) 5-325 MG per tablet,  Take 1 tablet by mouth every 6 (six) hours as needed for moderate pain., Disp: , Rfl:  .  methotrexate (RHEUMATREX) 2.5 MG tablet, Take 10 tablets (25 mg total) by mouth 2 (two) times a week. Caution:Chemotherapy. Protect from light. (Patient taking differently: Take 2.5 mg by mouth 2 (two) times a week. Caution:Chemotherapy. Protect from light. Takes on Monday and Friday), Disp: 4 tablet, Rfl: 0 .  NEXIUM 40 MG capsule, Take 40 mg by mouth daily. , Disp: , Rfl:  .  predniSONE (DELTASONE) 2.5 MG tablet, Take 2.5 mg by mouth 2 (two) times daily with a meal., Disp: , Rfl:  .  senna (SENOKOT) 8.6 MG TABS tablet, Take 2 tablets (17.2 mg total) by mouth at bedtime., Disp: 120 each, Rfl: 0 .  vitamin B-12 (CYANOCOBALAMIN) 100 MCG  tablet, Take 100 mcg by mouth daily., Disp: , Rfl:  .  Vitamin D, Ergocalciferol, (DRISDOL) 50000 UNITS CAPS capsule, Take 50,000 Units by mouth every 7 (seven) days., Disp: , Rfl:  .  amoxicillin-clavulanate (AUGMENTIN) 875-125 MG per tablet, Take 1 tablet by mouth every 12 (twelve) hours. (Patient not taking: Reported on 12/15/2014), Disp: 28 tablet, Rfl: 0 .  diphenhydrAMINE (BENADRYL) 25 MG tablet, Take 25 mg by mouth every 6 (six) hours as needed for allergies or sleep., Disp: , Rfl:  .  polyethylene glycol (MIRALAX / GLYCOLAX) packet, Take 17 g by mouth daily. (Patient not taking: Reported on 12/15/2014), Disp: 14 each, Rfl: 0          Review of Systems  Constitutional: Positive for fever. Negative for chills, diaphoresis, appetite change, fatigue and unexpected weight change.  HENT: Negative.   Eyes: Negative for photophobia and visual disturbance.  Respiratory: Negative for chest tightness and shortness of breath.   Cardiovascular: Negative for chest pain.  Gastrointestinal: Negative for nausea, vomiting, abdominal pain, diarrhea and constipation.  Genitourinary: Negative for dysuria, urgency, hematuria, flank pain, decreased urine volume and difficulty urinating.  Musculoskeletal: Positive for back pain. Negative for myalgias and neck stiffness.  Skin: Negative for rash and wound.  Neurological: Positive for weakness and numbness. Negative for dizziness, syncope, light-headedness and headaches.       States her right leg and foot goes numb when she walks.  Hematological: Negative for adenopathy.  Psychiatric/Behavioral: Negative for behavioral problems, sleep disturbance, decreased concentration and agitation. The patient is not nervous/anxious.        Objective:   Physical Exam  Constitutional: She is oriented to person, place, and time. She appears well-developed and well-nourished.  HENT:  Head: Normocephalic and atraumatic.  Mouth/Throat: No oropharyngeal exudate.    Eyes: Conjunctivae are normal. Pupils are equal, round, and reactive to light.  Neck: Normal range of motion. Neck supple.  Cardiovascular: Normal rate and regular rhythm.   Pulmonary/Chest: Effort normal and breath sounds normal.  Abdominal: Soft. There is no tenderness.  Musculoskeletal: Normal range of motion. She exhibits tenderness.  Lymphadenopathy:    She has no cervical adenopathy.  Neurological: She is alert and oriented to person, place, and time.  4/5 strength to hip flexion, weaker than left side.  Skin: Skin is warm and dry. No rash noted. No erythema.  Psychiatric: She has a normal mood and affect. Her behavior is normal. Judgment and thought content normal.     Left ankle 11/10/14:      Ankle today with healed scars   Blood pressure 125/84, pulse 87, temperature 98.3 F (36.8 C), temperature source Oral, weight 160 lb (72.576  kg).  CBC    Component Value Date/Time   WBC 4.3 10/02/2014 0440   RBC 3.61* 10/02/2014 0440   HGB 11.8* 10/02/2014 0440   HCT 36.4 10/02/2014 0440   PLT 266 10/02/2014 0440   MCV 100.8* 10/02/2014 0440   MCH 32.7 10/02/2014 0440   MCHC 32.4 10/02/2014 0440   RDW 12.4 10/02/2014 0440   LYMPHSABS 1.3 08/20/2014 1140   MONOABS 1.0 08/20/2014 1140   EOSABS 0.0 08/20/2014 1140   BASOSABS 0.0 08/20/2014 1140    BMET    Component Value Date/Time   NA 139 10/02/2014 0440   K 3.7 10/02/2014 0440   CL 105 10/02/2014 0440   CO2 27 10/02/2014 0440   GLUCOSE 91 10/02/2014 0440   BUN 11 10/02/2014 0440   CREATININE 0.61 10/02/2014 0440   CALCIUM 8.9 10/02/2014 0440   GFRNONAA >60 10/02/2014 0440   GFRAA >60 10/02/2014 0440          Assessment & Plan:  Perinephric abscess, MSSA bacteremia with new onset right leg numbness: MRi showed synovial cyst and abscess resolved. Ancef done. And off abx  Cat bite and fall on let ankle: augmentin finished and sites are healing up

## 2014-12-30 ENCOUNTER — Encounter (HOSPITAL_COMMUNITY): Payer: Self-pay | Admitting: Emergency Medicine

## 2014-12-30 ENCOUNTER — Inpatient Hospital Stay (HOSPITAL_COMMUNITY)
Admission: EM | Admit: 2014-12-30 | Discharge: 2015-01-01 | DRG: 871 | Disposition: A | Discharge status: Home / Self Care | Payer: Medicare Other | Attending: Family Medicine | Admitting: Family Medicine

## 2014-12-30 DIAGNOSIS — A419 Sepsis, unspecified organism: Secondary | ICD-10-CM | POA: Diagnosis not present

## 2014-12-30 DIAGNOSIS — K219 Gastro-esophageal reflux disease without esophagitis: Secondary | ICD-10-CM | POA: Diagnosis present

## 2014-12-30 DIAGNOSIS — Z66 Do not resuscitate: Secondary | ICD-10-CM | POA: Diagnosis present

## 2014-12-30 DIAGNOSIS — M069 Rheumatoid arthritis, unspecified: Secondary | ICD-10-CM | POA: Diagnosis present

## 2014-12-30 DIAGNOSIS — IMO0002 Reserved for concepts with insufficient information to code with codable children: Secondary | ICD-10-CM | POA: Diagnosis present

## 2014-12-30 DIAGNOSIS — Z87891 Personal history of nicotine dependence: Secondary | ICD-10-CM

## 2014-12-30 DIAGNOSIS — M329 Systemic lupus erythematosus, unspecified: Secondary | ICD-10-CM | POA: Diagnosis present

## 2014-12-30 DIAGNOSIS — J189 Pneumonia, unspecified organism: Secondary | ICD-10-CM | POA: Diagnosis present

## 2014-12-30 DIAGNOSIS — I959 Hypotension, unspecified: Secondary | ICD-10-CM | POA: Diagnosis present

## 2014-12-30 DIAGNOSIS — J069 Acute upper respiratory infection, unspecified: Secondary | ICD-10-CM

## 2014-12-30 DIAGNOSIS — I9589 Other hypotension: Secondary | ICD-10-CM | POA: Diagnosis not present

## 2014-12-30 DIAGNOSIS — R6510 Systemic inflammatory response syndrome (SIRS) of non-infectious origin without acute organ dysfunction: Secondary | ICD-10-CM | POA: Diagnosis present

## 2014-12-30 DIAGNOSIS — Z7952 Long term (current) use of systemic steroids: Secondary | ICD-10-CM

## 2014-12-30 DIAGNOSIS — R509 Fever, unspecified: Secondary | ICD-10-CM

## 2014-12-30 NOTE — ED Notes (Signed)
Patient starting having chills, numbness and hands and feet around 5 pm.

## 2014-12-30 NOTE — ED Provider Notes (Addendum)
CSN: [id number]     Arrival date & time 12/30/14  2348 History  By signing my name below, I, Deanna Ball, attest that this documentation has been prepared under the direction and in the presence of Shon Baton, MD. Electronically Signed: Murriel Ball, ED Scribe. 12/30/2014. 11:54 PM.    No chief complaint on file.    The history is provided by the patient. No language interpreter was used.   HPI Comments: Deanna Ball is a 54 y.o. female with Lupus and RA who presents to the Emergency Department complaining of intermittent chills with associated numbness in his hands and feet that has been present since earlier today. Pt reports she thought that she was having an adverse reaction to her antibiotics which were started on Monday. She normally takes methotrexate and prednisone for lupus. However, was told her primary physician to discontinue this while on doxycycline and Diflucan. Pt states that she saw her PCP Monday and was diagnosed with a yeast infection, and given a prescription to treat that as well as a prescription of doxycycline to treat a productive cough and URI symptoms. Pt states she has been coughing up green phlegm intermittently, and also reports having intermittent SOB and chest pain for around a week in addition to her present symptoms that began today. Pt states her chest pain feels "like pressure". Pt denies fever but endorses chills.  Of note, patient with admission for perinephric abscess and MSSA bacteremia earlier this summer.    Past Medical History  Diagnosis Date  . Lupus (HCC)   . Rheumatoid arthritis (HCC)   . Shortness of breath dyspnea   . Anxiety   . Kidney abscess 08/2014  . GERD (gastroesophageal reflux disease)   . Synovial cyst   . Cat bite 11/10/2014  . Swelling of joint, ankle, right 11/10/2014   Past Surgical History  Procedure Laterality Date  . Hernia repair    . Appendectomy    . Lumbar fusion    . Kidney stent placement    . Tee  without cardioversion N/A 08/25/2014    Procedure: TRANSESOPHAGEAL ECHOCARDIOGRAM (TEE);  Surgeon: Thurmon Fair, MD;  Location: [facility] ENDOSCOPY;  Service: Cardiovascular;  Laterality: N/A;  . Peripherally inserted central catheter insertion  08/2014   Family History  Problem Relation Age of Onset  . Lupus Maternal Uncle    Social History  Substance Use Topics  . Smoking status: Former Smoker -- 0.50 packs/day for 45 years    Types: Cigarettes    Quit date: 09/02/2014  . Smokeless tobacco: Never Used  . Alcohol Use: No     Comment: patient drinks 2 bottles of wild irish rose wine last drink 4 days ago   09/2014 I have not drank in 2 months "   OB History    No data available     Review of Systems  Constitutional: Positive for chills. Negative for fever.  Respiratory: Positive for cough, chest tightness and shortness of breath.   Cardiovascular: Negative for leg swelling.  Gastrointestinal: Negative for nausea, vomiting and abdominal pain.  Genitourinary: Negative for dysuria.  Neurological: Negative for headaches.  Psychiatric/Behavioral: Negative for confusion.  All other systems reviewed and are negative.     Allergies  Review of patient's allergies indicates no known allergies.  Home Medications   Prior to Admission medications   Medication Sig Start Date End Date Taking? Authorizing Provider  ALPRAZolam Prudy Feeler) 1 MG tablet Take 1 tablet (1 mg total) by mouth 2 (two)  times daily. 08/29/14  Yes Kathlen Mody, MD  diphenhydrAMINE (BENADRYL) 25 MG tablet Take 25 mg by mouth every 6 (six) hours as needed for allergies or sleep.   Yes Historical Provider, MD  folic acid (FOLVITE) 1 MG tablet Take 1 tablet (1 mg total) by mouth daily. 08/29/14  Yes Kathlen Mody, MD  HYDROcodone-acetaminophen (NORCO/VICODIN) 5-325 MG per tablet Take 1 tablet by mouth every 6 (six) hours as needed for moderate pain.   Yes Historical Provider, MD  methotrexate (RHEUMATREX) 2.5 MG tablet Take 10 tablets  (25 mg total) by mouth 2 (two) times a week. Caution:Chemotherapy. Protect from light. Patient taking differently: Take 2.5 mg by mouth 2 (two) times a week. Caution:Chemotherapy. Protect from light. Takes on Monday and Friday 10/02/14  Yes Christiane Ha, MD  NEXIUM 40 MG capsule Take 40 mg by mouth daily.  07/17/14  Yes Historical Provider, MD  predniSONE (DELTASONE) 2.5 MG tablet Take 2.5 mg by mouth 2 (two) times daily with a meal.   Yes Historical Provider, MD  senna (SENOKOT) 8.6 MG TABS tablet Take 2 tablets (17.2 mg total) by mouth at bedtime. 08/29/14  Yes Kathlen Mody, MD  thiamine (VITAMIN B-1) 100 MG tablet Take 100 mg by mouth daily.   Yes Historical Provider, MD  vitamin B-12 (CYANOCOBALAMIN) 100 MCG tablet Take 100 mcg by mouth daily.   Yes Historical Provider, MD  Vitamin D, Ergocalciferol, (DRISDOL) 50000 UNITS CAPS capsule Take 50,000 Units by mouth every 7 (seven) days.   Yes Historical Provider, MD  amoxicillin-clavulanate (AUGMENTIN) 875-125 MG per tablet Take 1 tablet by mouth every 12 (twelve) hours. Patient not taking: Reported on 12/15/2014 10/19/14   Lyndal Pulley, MD  docusate sodium (COLACE) 100 MG capsule Take 100 mg by mouth daily as needed for mild constipation.    Historical Provider, MD  polyethylene glycol (MIRALAX / GLYCOLAX) packet Take 17 g by mouth daily. 08/29/14   Kathlen Mody, MD   BP 96/55 mmHg  Pulse 77  Temp(Src) 98.6 F (37 C) (Oral)  Resp 15  Ht 5' 2.5" (1.588 m)  Wt 149 lb (67.586 kg)  BMI 26.80 kg/m2  SpO2 97% Physical Exam  Constitutional: She is oriented to person, place, and time. She appears well-developed and well-nourished. No distress.  HENT:  Head: Normocephalic and atraumatic.  Eyes: Pupils are equal, round, and reactive to light.  Neck: Neck supple.  Cardiovascular: Regular rhythm and normal heart sounds.   No murmur heard. Tachycardia  Pulmonary/Chest: Effort normal and breath sounds normal. No respiratory distress. She has no  wheezes.  Abdominal: Soft. Bowel sounds are normal. There is no tenderness. There is no rebound.  Musculoskeletal: She exhibits no edema.  Neurological: She is alert and oriented to person, place, and time.  Skin: Skin is warm and dry.  Well-healing puncture wounds noted over the right ankle, no adjacent erythema or fluctuance noted  Psychiatric: She has a normal mood and affect.  Nursing note and vitals reviewed.   ED Course  Procedures (including critical care time)  CRITICAL CARE Performed by: Shon Baton   Total critical care time: 35 min  Critical care time was exclusive of separately billable procedures and treating other patients.  Critical care was necessary to treat or prevent imminent or life-threatening deterioration.  Critical care was time spent personally by me on the following activities: development of treatment plan with patient and/or surrogate as well as nursing, discussions with consultants, evaluation of patient's response to treatment, examination  of patient, obtaining history from patient or surrogate, ordering and performing treatments and interventions, ordering and review of laboratory studies, ordering and review of radiographic studies, pulse oximetry and re-evaluation of patient's condition.   DIAGNOSTIC STUDIES: Oxygen Saturation is 97% on room air, normal by my interpretation.    COORDINATION OF CARE: 11:52 PM Discussed treatment plan with pt at bedside and pt agreed to plan.   Labs Review Labs Reviewed  CBC WITH DIFFERENTIAL/PLATELET - Abnormal; Notable for the following:    WBC 11.4 (*)    Neutro Abs 9.3 (*)    All other components within normal limits  COMPREHENSIVE METABOLIC PANEL - Abnormal; Notable for the following:    Glucose, Bld 112 (*)    AST 66 (*)    Alkaline Phosphatase 294 (*)    All other components within normal limits  URINALYSIS, ROUTINE W REFLEX MICROSCOPIC (NOT AT [facility]) - Abnormal; Notable for the following:     Hgb urine dipstick SMALL (*)    Bilirubin Urine SMALL (*)    Protein, ur 30 (*)    Leukocytes, UA SMALL (*)    All other components within normal limits  CULTURE, BLOOD (ROUTINE X 2)  CULTURE, BLOOD (ROUTINE X 2)  URINE CULTURE  URINE CULTURE  LACTIC ACID, PLASMA  URINE MICROSCOPIC-ADD ON  URINALYSIS, ROUTINE W REFLEX MICROSCOPIC (NOT AT [facility])  INFLUENZA PANEL BY PCR (TYPE A & B, H1N1)    Imaging Review Dg Chest 2 View  12/31/2014  CLINICAL DATA:  54 year old female with fever chills and cough for 3 days. Initial encounter. EXAM: CHEST  2 VIEW COMPARISON:  [facility] portable chest radiograph 10/20/2014 and earlier. FINDINGS: Lung volumes remain within normal limits. Normal cardiac size and mediastinal contours. Visualized tracheal air column is within normal limits. Chronic increased pulmonary interstitium appears stable. No pneumothorax, pulmonary edema, pleural effusion or confluent pulmonary opacity. No acute osseous abnormality identified. EKG leads and wires project over the chest. IMPRESSION: No acute cardiopulmonary abnormality. Electronically Signed   By: Odessa Fleming M.D.   On: 12/31/2014 01:12   I have personally reviewed and evaluated these images and lab results as part of my medical decision-making.   EKG Interpretation   Date/Time:  Tuesday December 30 2014 23:58:21 EDT Ventricular Rate:  114 PR Interval:  113 QRS Duration: 90 QT Interval:  330 QTC Calculation: 454 R Axis:   -7 Text Interpretation:  Sinus tachycardia Abnormal R-wave progression, early  transition Confirmed by Joclyn Alsobrook  MD, Khaliya Golinski ([id number]) on 12/31/2014  12:16:22 AM      MDM   Final diagnoses:  Other specified fever  Other specified hypotension  URI (upper respiratory infection)    Patient presents with chills. Recent history of cough and upper respiratory symptoms. Was started on doxycycline yesterday. Is immunosuppressed on prednisone and methotrexate. Noted to be febrile to 101.6.  Otherwise mildly tachycardic. Sepsis workup initiated.  Initial SOFA 0.  Mild leukocytosis noted. Normal lactate. Chest x-ray without evidence of pneumonia; however, this can lag behind. Urinalysis without evidence of urinary tract infection. Given chills and respiratory symptoms, flu would be consideration as well. Of note, patient wasn't noted to have downward trending blood pressures.  SOFA now 1.  She is asymptomatic. Most blood pressures in the 90s over 60s range. She was given a total of 3 L of fluid with only transient improvement. She remains asymptomatic. No history of hypertension but her normal blood pressures range 110-120 systolic. Patient was given IV Levaquin to cover  for pneumonia. Blood cultures are pending. Given that she is immunosuppressed and has marginal blood pressures, will admit for monitoring, hydration, and further antibiotics. Patient additionally given vancomycin to broaden her coverage. Flu swab pending and patient given a dose of Tamiflu. Also, given patient's chronic prednisone use, patient was given 100 mg of hydrocortisone for adrenal insufficiency.  Discussed with Dr. Adrian Blackwater. Will admit.  I personally performed the services described in this documentation, which was scribed in my presence. The recorded information has been reviewed and is accurate.    Shon Baton, MD 12/31/14 1610  Shon Baton, MD 12/31/14 [phone]

## 2014-12-31 ENCOUNTER — Emergency Department (HOSPITAL_COMMUNITY): Payer: Medicare Other

## 2014-12-31 ENCOUNTER — Encounter (HOSPITAL_COMMUNITY): Payer: Self-pay

## 2014-12-31 DIAGNOSIS — Z87891 Personal history of nicotine dependence: Secondary | ICD-10-CM | POA: Diagnosis not present

## 2014-12-31 DIAGNOSIS — J189 Pneumonia, unspecified organism: Secondary | ICD-10-CM | POA: Diagnosis not present

## 2014-12-31 DIAGNOSIS — K219 Gastro-esophageal reflux disease without esophagitis: Secondary | ICD-10-CM | POA: Diagnosis present

## 2014-12-31 DIAGNOSIS — M069 Rheumatoid arthritis, unspecified: Secondary | ICD-10-CM

## 2014-12-31 DIAGNOSIS — I959 Hypotension, unspecified: Secondary | ICD-10-CM | POA: Diagnosis present

## 2014-12-31 DIAGNOSIS — Z66 Do not resuscitate: Secondary | ICD-10-CM | POA: Diagnosis present

## 2014-12-31 DIAGNOSIS — A419 Sepsis, unspecified organism: Secondary | ICD-10-CM | POA: Diagnosis not present

## 2014-12-31 DIAGNOSIS — R6510 Systemic inflammatory response syndrome (SIRS) of non-infectious origin without acute organ dysfunction: Secondary | ICD-10-CM

## 2014-12-31 DIAGNOSIS — M329 Systemic lupus erythematosus, unspecified: Secondary | ICD-10-CM | POA: Diagnosis not present

## 2014-12-31 DIAGNOSIS — I9589 Other hypotension: Secondary | ICD-10-CM | POA: Diagnosis present

## 2014-12-31 DIAGNOSIS — Z7952 Long term (current) use of systemic steroids: Secondary | ICD-10-CM | POA: Diagnosis not present

## 2014-12-31 LAB — CBC WITH DIFFERENTIAL/PLATELET
BASOS PCT: 0 %
Basophils Absolute: 0 10*3/uL (ref 0.0–0.1)
EOS ABS: 0 10*3/uL (ref 0.0–0.7)
EOS PCT: 0 %
HCT: 38.9 % (ref 36.0–46.0)
HEMOGLOBIN: 13.1 g/dL (ref 12.0–15.0)
Lymphocytes Relative: 10 %
Lymphs Abs: 1.1 10*3/uL (ref 0.7–4.0)
MCH: 30.6 pg (ref 26.0–34.0)
MCHC: 33.7 g/dL (ref 30.0–36.0)
MCV: 90.9 fL (ref 78.0–100.0)
Monocytes Absolute: 0.9 10*3/uL (ref 0.1–1.0)
Monocytes Relative: 8 %
NEUTROS PCT: 82 %
Neutro Abs: 9.3 10*3/uL — ABNORMAL HIGH (ref 1.7–7.7)
PLATELETS: 228 10*3/uL (ref 150–400)
RBC: 4.28 MIL/uL (ref 3.87–5.11)
RDW: 14.2 % (ref 11.5–15.5)
WBC: 11.4 10*3/uL — AB (ref 4.0–10.5)

## 2014-12-31 LAB — URINALYSIS, ROUTINE W REFLEX MICROSCOPIC
GLUCOSE, UA: NEGATIVE mg/dL
Ketones, ur: NEGATIVE mg/dL
Nitrite: NEGATIVE
PH: 6 (ref 5.0–8.0)
Protein, ur: 30 mg/dL — AB
SPECIFIC GRAVITY, URINE: 1.02 (ref 1.005–1.030)
Urobilinogen, UA: 0.2 mg/dL (ref 0.0–1.0)

## 2014-12-31 LAB — COMPREHENSIVE METABOLIC PANEL
ALBUMIN: 3.5 g/dL (ref 3.5–5.0)
ALK PHOS: 294 U/L — AB (ref 38–126)
ALT: 49 U/L (ref 14–54)
ANION GAP: 7 (ref 5–15)
AST: 66 U/L — ABNORMAL HIGH (ref 15–41)
BUN: 19 mg/dL (ref 6–20)
CALCIUM: 9.1 mg/dL (ref 8.9–10.3)
CHLORIDE: 103 mmol/L (ref 101–111)
CO2: 25 mmol/L (ref 22–32)
Creatinine, Ser: 0.79 mg/dL (ref 0.44–1.00)
GFR calc non Af Amer: >60 mL/min (ref >60)
GLUCOSE: 112 mg/dL — AB (ref 65–99)
Potassium: 3.5 mmol/L (ref 3.5–5.1)
SODIUM: 135 mmol/L (ref 135–145)
Total Bilirubin: 0.8 mg/dL (ref 0.3–1.2)
Total Protein: 7.5 g/dL (ref 6.5–8.1)

## 2014-12-31 LAB — URINE MICROSCOPIC-ADD ON

## 2014-12-31 LAB — INFLUENZA PANEL BY PCR (TYPE A & B)
H1N1 flu by pcr: NOT DETECTED
Influenza A By PCR: NEGATIVE
Influenza B By PCR: NEGATIVE

## 2014-12-31 LAB — LACTIC ACID, PLASMA: Lactic Acid, Venous: 1.7 mmol/L (ref 0.5–2.0)

## 2014-12-31 LAB — MRSA PCR SCREENING: MRSA by PCR: NEGATIVE

## 2014-12-31 MED ORDER — SODIUM CHLORIDE 0.9 % IV BOLUS (SEPSIS)
1000.0000 mL | Freq: Once | INTRAVENOUS | Status: AC
  Administered 2014-12-31: 1000 mL via INTRAVENOUS

## 2014-12-31 MED ORDER — HYDROCORTISONE SOD SUCCINATE 100 MG PF FOR IT USE: 100.0000 mg | Freq: Once | INTRAMUSCULAR | Status: DC

## 2014-12-31 MED ORDER — HYDROCORTISONE NA SUCCINATE PF 100 MG IJ SOLR
100.0000 mg | Freq: Once | INTRAMUSCULAR | Status: AC
  Administered 2014-12-31: 100 mg via INTRAVENOUS
  Filled 2014-12-31: qty 2

## 2014-12-31 MED ORDER — HYDROCODONE-ACETAMINOPHEN 5-325 MG PO TABS
1.0000 | ORAL_TABLET | Freq: Four times a day (QID) | ORAL | Status: DC | PRN
  Administered 2014-12-31 – 2015-01-01 (×5): 1 via ORAL
  Filled 2014-12-31 (×5): qty 1

## 2014-12-31 MED ORDER — SODIUM CHLORIDE 0.9 % IV SOLN
Freq: Once | INTRAVENOUS | Status: AC
  Administered 2014-12-31: 05:00:00 via INTRAVENOUS

## 2014-12-31 MED ORDER — HYDROCORTISONE NA SUCCINATE PF 100 MG IJ SOLR: 100.0000 mg | Freq: Three times a day (TID) | INTRAMUSCULAR | Status: DC

## 2014-12-31 MED ORDER — LEVOFLOXACIN IN D5W 500 MG/100ML IV SOLN
500.0000 mg | Freq: Once | INTRAVENOUS | Status: AC
  Administered 2014-12-31: 500 mg via INTRAVENOUS
  Filled 2014-12-31: qty 100

## 2014-12-31 MED ORDER — HYDROCORTISONE NA SUCCINATE PF 100 MG IJ SOLR
100.0000 mg | Freq: Two times a day (BID) | INTRAMUSCULAR | Status: DC
  Administered 2014-12-31 – 2015-01-01 (×2): 100 mg via INTRAVENOUS
  Filled 2014-12-31 (×2): qty 2

## 2014-12-31 MED ORDER — OSELTAMIVIR PHOSPHATE 75 MG PO CAPS
75.0000 mg | ORAL_CAPSULE | Freq: Every day | ORAL | Status: DC
  Administered 2014-12-31 – 2015-01-01 (×2): 75 mg via ORAL
  Filled 2014-12-31 (×2): qty 1

## 2014-12-31 MED ORDER — DIPHENHYDRAMINE HCL 25 MG PO CAPS
25.0000 mg | ORAL_CAPSULE | Freq: Four times a day (QID) | ORAL | Status: DC | PRN
  Administered 2014-12-31: 25 mg via ORAL
  Filled 2014-12-31: qty 1

## 2014-12-31 MED ORDER — VANCOMYCIN HCL IN DEXTROSE 1-5 GM/200ML-% IV SOLN
1000.0000 mg | Freq: Once | INTRAVENOUS | Status: AC
  Administered 2014-12-31: 1000 mg via INTRAVENOUS
  Filled 2014-12-31: qty 200

## 2014-12-31 MED ORDER — ENOXAPARIN SODIUM 40 MG/0.4ML ~~LOC~~ SOLN
40.0000 mg | SUBCUTANEOUS | Status: DC
  Administered 2014-12-31 – 2015-01-01 (×2): 40 mg via SUBCUTANEOUS
  Filled 2014-12-31 (×2): qty 0.4

## 2014-12-31 MED ORDER — VANCOMYCIN HCL IN DEXTROSE 750-5 MG/150ML-% IV SOLN
750.0000 mg | Freq: Two times a day (BID) | INTRAVENOUS | Status: DC
  Filled 2014-12-31 (×2): qty 150

## 2014-12-31 MED ORDER — VANCOMYCIN HCL 500 MG IV SOLR
500.0000 mg | Freq: Once | INTRAVENOUS | Status: AC
  Administered 2014-12-31: 500 mg via INTRAVENOUS
  Filled 2014-12-31: qty 500

## 2014-12-31 MED ORDER — ALPRAZOLAM 1 MG PO TABS
1.0000 mg | ORAL_TABLET | Freq: Two times a day (BID) | ORAL | Status: DC
  Administered 2014-12-31 – 2015-01-01 (×3): 1 mg via ORAL
  Filled 2014-12-31 (×3): qty 1

## 2014-12-31 MED ORDER — ACETAMINOPHEN 325 MG PO TABS
650.0000 mg | ORAL_TABLET | Freq: Once | ORAL | Status: AC
  Administered 2014-12-31: 650 mg via ORAL
  Filled 2014-12-31: qty 2

## 2014-12-31 MED ORDER — LEVOFLOXACIN IN D5W 750 MG/150ML IV SOLN
750.0000 mg | INTRAVENOUS | Status: DC
  Administered 2014-12-31: 750 mg via INTRAVENOUS
  Filled 2014-12-31: qty 150

## 2014-12-31 MED ORDER — PANTOPRAZOLE SODIUM 40 MG PO TBEC
80.0000 mg | DELAYED_RELEASE_TABLET | Freq: Every day | ORAL | Status: DC
  Administered 2014-12-31 – 2015-01-01 (×2): 80 mg via ORAL
  Filled 2014-12-31 (×2): qty 2

## 2014-12-31 NOTE — Progress Notes (Signed)
PROGRESS NOTE  Deanna Ball TOI:[id number] DOB: Jul 19, 1960 DOA: 12/30/2014 PCP: Colon Branch, MD  Summary: 54 y.o. female with a history of lupus, rheumatoid arthritis on prednisone and methotrexate, and GERD presented with 1 week hx of upper respiratory symptoms including a cough with purulent sputum production, fever, chills, and chest pressure. Patient saw her PCP 1 week ago and was prescribed Doxycycline. She took two doses but stopped because it didn't agree wit her Methotrexate. While in the ED, the patient became hypotensive with systolic blood pressure in the 90s which responded minimally to 3 L of IV fluids. Admitted for further management.   Assessment/Plan: 1. Possible sepsis with hypotension, resolved. WBC 11.4. Afebrile. BC/UC pending.  2. Possible CAP, CXR unremarkable. WBC 11.4. Flu negative. No hypoxia. Reports cough at home. Immunocompromised. 3. Lupus and RA. On Methotrexate and Prednisone as outpatient.  4. Elevated AP and AST, hx of similar in the past. .  5. S/p perinephric abcess, MRSA bacteremia 09/2014.   Overall improved. Continue IVF and empiric abx. Monitor for recurrent fever.   Possible discharge tomorrow if improved.  Code Status: DNR DVT prophylaxis: Lovenox Family Communication: No family at bedside. Discussed with patient who understands and has no concerns at this time. Disposition Plan: Anticipate discharge within 1-2 days.  Brendia Sacks, MD  [facility]  Pager [phone] If 7PM-7AM, please contact night-coverage at [email or web address], password [email or web address] 12/31/2014, 10:55 AM  LOS: 0 days   Consultants:    Procedures:    Antibiotics:  Levaquin 10/12>>  Vancomycin 10/12>>  HPI/Subjective: Feels better. Has an appetite. Denies any nausea or vomiting but has intermittent pressure in her chest and a cough productive of green sputum.   Objective: Filed Vitals:   12/31/14 0545 12/31/14 0600 12/31/14 0617 12/31/14 0623  BP: 109/45  100/44  106/47  Pulse: 76 79  80  Temp:    97.3 F (36.3 C)  TempSrc:    Oral  Resp: 15 19    Height:   5' 2.5" (1.588 m)   Weight:   69.673 kg (153 lb 9.6 oz)   SpO2: 99% 97%  98%    Intake/Output Summary (Last 24 hours) at 12/31/14 1055 Last data filed at 12/31/14 0520  Gross per 24 hour  Intake    300 ml  Output      0 ml  Net    300 ml     Filed Weights   12/30/14 2359 12/31/14 0617  Weight: 67.586 kg (149 lb) 69.673 kg (153 lb 9.6 oz)    Exam:    VSS, afebrile, not hypoxic General:  Appears comfortable, calm. Cardiovascular: Regular rate and rhythm, no murmur, rub or gallop. No lower extremity edema. Respiratory: Clear to auscultation bilaterally, no wheezes, rales or rhonchi. Normal respiratory effort. Abdomen: soft, ntnd Musculoskeletal: grossly normal tone bilateral upper and lower extremities Psychiatric: grossly normal mood and affect, speech fluent and appropriate Neurologic: grossly non-focal.  New data reviewed:    Pertinent data since admission:  CXR; no acute disease  WBC 11.4  Pending data:  BC/ UC  Scheduled Meds: . ALPRAZolam  1 mg Oral BID  . enoxaparin (LOVENOX) injection  40 mg Subcutaneous Q24H  . hydrocortisone sod succinate (SOLU-CORTEF) inj  100 mg Intravenous Q8H  . levofloxacin (LEVAQUIN) IV  750 mg Intravenous Q24H  . oseltamivir  75 mg Oral Daily  . pantoprazole  80 mg Oral Q1200  . vancomycin  500 mg Intravenous Once  . vancomycin  750 mg Intravenous Q12H  Continuous Infusions:   Principal Problem:   Sepsis (HCC) Active Problems:   Rheumatoid arthritis (HCC)   Community acquired pneumonia   Time spent 25 minutes   By signing my name below, I, Burnett Harry attest that this documentation has been prepared under the direction and in the presence of Brendia Sacks, MD Electronically signed: Burnett Harry, Scribe. 12/31/2014 1:30pm  I personally performed the services described in this documentation. All  medical record entries made by the scribe were at my direction. I have reviewed the chart and agree that the record reflects my personal performance and is accurate and complete. Brendia Sacks, MD

## 2014-12-31 NOTE — H&P (Signed)
History and Physical  Deanna Ball ZOX:[id number] DOB: 1960/12/01 DOA: 12/30/2014  Referring physician: Dr Wilkie Aye, ED physician PCP: Colon Branch, MD   Chief Complaint: Cough, fevers, chills  HPI: Deanna Ball is a 54 y.o. female  With a history of lupus, rheumatoid arthritis on prednisone and methotrexate, GERD, history of perinephric abscess in June 2016, GERD. Patient seen for 1 week of upper respiratory symptoms with purulent sputum production. There is symptoms have been getting worse. She saw her primary care physician and was put on doxycycline. Patient took 2 doses. She started having fevers and chills earlier last night and came into the hospital for evaluation. No palliating or provoking factors.  While in emergency department. The patient became hypotensive with systolic blood pressure in the 90s which responded minimally to 3 L of IV fluids.   Review of Systems:   Pt denies any nausea, vomiting, diarrhea, constipation, abdominal pain, shortness of breath, dyspnea on exertion, orthopnea, wheezing, palpitations, headache, vision changes, lightheadedness, dizziness, diarrhea, constipation, melena, rectal bleeding.  Review of systems are otherwise negative  Past Medical History  Diagnosis Date  . Lupus (HCC)   . Rheumatoid arthritis (HCC)   . Shortness of breath dyspnea   . Anxiety   . Kidney abscess 08/2014  . GERD (gastroesophageal reflux disease)   . Synovial cyst   . Cat bite 11/10/2014  . Swelling of joint, ankle, right 11/10/2014   Past Surgical History  Procedure Laterality Date  . Hernia repair    . Appendectomy    . Lumbar fusion    . Kidney stent placement    . Tee without cardioversion N/A 08/25/2014    Procedure: TRANSESOPHAGEAL ECHOCARDIOGRAM (TEE);  Surgeon: Thurmon Fair, MD;  Location: [facility] ENDOSCOPY;  Service: Cardiovascular;  Laterality: N/A;  . Peripherally inserted central catheter insertion  08/2014   Social History:  reports that she quit  smoking about 3 months ago. Her smoking use included Cigarettes. She has a 22.5 pack-year smoking history. She has never used smokeless tobacco. She reports that she does not drink alcohol or use illicit drugs. Patient lives at home & is able to participate in activities of daily living  No Known Allergies  Family History  Problem Relation Age of Onset  . Lupus Maternal Uncle       Prior to Admission medications   Medication Sig Start Date End Date Taking? Authorizing Provider  ALPRAZolam Prudy Feeler) 1 MG tablet Take 1 tablet (1 mg total) by mouth 2 (two) times daily. 08/29/14  Yes Kathlen Mody, MD  diphenhydrAMINE (BENADRYL) 25 MG tablet Take 25 mg by mouth every 6 (six) hours as needed for allergies or sleep.   Yes Historical Provider, MD  folic acid (FOLVITE) 1 MG tablet Take 1 tablet (1 mg total) by mouth daily. 08/29/14  Yes Kathlen Mody, MD  HYDROcodone-acetaminophen (NORCO/VICODIN) 5-325 MG per tablet Take 1 tablet by mouth every 6 (six) hours as needed for moderate pain.   Yes Historical Provider, MD  methotrexate (RHEUMATREX) 2.5 MG tablet Take 10 tablets (25 mg total) by mouth 2 (two) times a week. Caution:Chemotherapy. Protect from light. Patient taking differently: Take 2.5 mg by mouth 2 (two) times a week. Caution:Chemotherapy. Protect from light. Takes on Monday and Friday 10/02/14  Yes Christiane Ha, MD  NEXIUM 40 MG capsule Take 40 mg by mouth daily.  07/17/14  Yes Historical Provider, MD  predniSONE (DELTASONE) 2.5 MG tablet Take 2.5 mg by mouth 2 (two) times daily with a meal.  Yes Historical Provider, MD  senna (SENOKOT) 8.6 MG TABS tablet Take 2 tablets (17.2 mg total) by mouth at bedtime. 08/29/14  Yes Kathlen Mody, MD  thiamine (VITAMIN B-1) 100 MG tablet Take 100 mg by mouth daily.   Yes Historical Provider, MD  vitamin B-12 (CYANOCOBALAMIN) 100 MCG tablet Take 100 mcg by mouth daily.   Yes Historical Provider, MD  Vitamin D, Ergocalciferol, (DRISDOL) 50000 UNITS CAPS  capsule Take 50,000 Units by mouth every 7 (seven) days.   Yes Historical Provider, MD    Physical Exam: BP 94/65 mmHg  Pulse 82  Temp(Src) 98.6 F (37 C) (Oral)  Resp 14  Ht 5' 2.5" (1.588 m)  Wt 67.586 kg (149 lb)  BMI 26.80 kg/m2  SpO2 98%  General: Middle-aged Caucasian female. Awake and alert and oriented x3. No acute cardiopulmonary distress.  Eyes: Pupils equal, round, reactive to light. Extraocular muscles are intact. Sclerae anicteric and noninjected.  ENT:  Moist mucosal membranes. No mucosal lesions.  Neck: Neck supple without lymphadenopathy. No carotid bruits. No masses palpated.  Cardiovascular: Regular rate with normal S1-S2 sounds. No murmurs, rubs, gallops auscultated. No JVD.  Respiratory: Mild rales in bases bilaterally. No wheezing or rhonchi. Abdomen: Soft, nontender, nondistended. Active bowel sounds. No masses or hepatosplenomegaly  Skin: Dry, warm to touch. 2+ dorsalis pedis and radial pulses. Musculoskeletal: No calf or leg pain. All major joints not erythematous nontender.  Psychiatric: Intact judgment and insight.  Neurologic: No focal neurological deficits. Cranial nerves II through XII are grossly intact.           Labs on Admission:  Basic Metabolic Panel:  Recent Labs Lab 12/31/14  NA 135  K 3.5  CL 103  CO2 25  GLUCOSE 112*  BUN 19  CREATININE 0.79  CALCIUM 9.1   Liver Function Tests:  Recent Labs Lab 12/31/14  AST 66*  ALT 49  ALKPHOS 294*  BILITOT 0.8  PROT 7.5  ALBUMIN 3.5   No results for input(s): LIPASE, AMYLASE in the last 168 hours. No results for input(s): AMMONIA in the last 168 hours. CBC:  Recent Labs Lab 12/31/14  WBC 11.4*  NEUTROABS 9.3*  HGB 13.1  HCT 38.9  MCV 90.9  PLT 228   Cardiac Enzymes: No results for input(s): CKTOTAL, CKMB, CKMBINDEX, TROPONINI in the last 168 hours.  BNP (last 3 results) No results for input(s): BNP in the last 8760 hours.  ProBNP (last 3 results) No results for  input(s): PROBNP in the last 8760 hours.  CBG: No results for input(s): GLUCAP in the last 168 hours.  Radiological Exams on Admission: Dg Chest 2 View  12/31/2014  CLINICAL DATA:  54 year old female with fever chills and cough for 3 days. Initial encounter. EXAM: CHEST  2 VIEW COMPARISON:  [facility] portable chest radiograph 10/20/2014 and earlier. FINDINGS: Lung volumes remain within normal limits. Normal cardiac size and mediastinal contours. Visualized tracheal air column is within normal limits. Chronic increased pulmonary interstitium appears stable. No pneumothorax, pulmonary edema, pleural effusion or confluent pulmonary opacity. No acute osseous abnormality identified. EKG leads and wires project over the chest. IMPRESSION: No acute cardiopulmonary abnormality. Electronically Signed   By: Odessa Fleming M.D.   On: 12/31/2014 01:12    EKG: Independently reviewed. Sinus tachycardia with ventricular rate of 114. Normal intervals.  Assessment/Plan Present on Admission:  . SIRS (systemic inflammatory response syndrome) (HCC) . Community acquired pneumonia  This patient was discussed with the ED physician, including pertinent vitals,  physical exam findings, labs, and imaging.  We also discussed care given by the ED provider.  #1 Sirs  Admit to telemetry  Continue IV fluids  Broad spectrum antibiotics covering for lung pathogens: Vancomycin and Levaquin  Continue Tamiflu  Respiratory virus panel pending  Blood cultures pending #2 community-acquired acquired pneumonia  Although the patient has symptoms similar to lower respiratory infection such as bronchitis, will treat the patient for pneumonia given SIRS  Recheck CBC  Continued antibiotics as above  Strep antigen and Legionella antigen pending #3 chronic steroid use  Hold methotrexate  Stress dose steroids: Hydrocortisone 100 mg IV every 8 hours  DVT prophylaxis: Lovenox  Consultants: None  Code Status:  DO NOT RESUSCITATE  Family Communication: None   Disposition Plan: Admit   Levie Heritage, DO [facility] Pager [phone]

## 2014-12-31 NOTE — Progress Notes (Signed)
ANTIBIOTIC CONSULT NOTE - INITIAL  Pharmacy Consult for Vancomycin and Levaquin Indication: rule out sepsis / pneumonia  No Known Allergies  Patient Measurements: Height: 5' 2.5" (158.8 cm) Weight: 153 lb 9.6 oz (69.673 kg) IBW/kg (Calculated) : 51.25  Vital Signs: Temp: 97.3 F (36.3 C) (10/12 0623) Temp Source: Oral (10/12 0623) BP: 106/47 mmHg (10/12 0623) Pulse Rate: 80 (10/12 0623) Intake/Output from previous day: 10/11 0701 - 10/12 0700 In: 300 [IV Piggyback:300] Out: -  Intake/Output from this shift:    Labs:  Recent Labs  12/31/14  WBC 11.4*  HGB 13.1  PLT 228  CREATININE 0.79   Estimated Creatinine Clearance: 72.8 mL/min (by C-G formula based on Cr of 0.79). No results for input(s): VANCOTROUGH, VANCOPEAK, VANCORANDOM, GENTTROUGH, GENTPEAK, GENTRANDOM, TOBRATROUGH, TOBRAPEAK, TOBRARND, AMIKACINPEAK, AMIKACINTROU, AMIKACIN in the last 72 hours.   Microbiology: Recent Results (from the past 720 hour(s))  Blood culture (routine x 2)     Status: None (Preliminary result)   Collection Time: 12/31/14 12:00 AM  Result Value Ref Range Status   Specimen Description LEFT ANTECUBITAL  Final   Special Requests BOTTLES DRAWN AEROBIC AND ANAEROBIC [facility] EACH  Final   Culture PENDING  Incomplete   Report Status PENDING  Incomplete  Blood culture (routine x 2)     Status: None (Preliminary result)   Collection Time: 12/31/14 12:12 AM  Result Value Ref Range Status   Specimen Description RIGHT ANTECUBITAL  Final   Special Requests BOTTLES DRAWN AEROBIC AND ANAEROBIC [facility] EACH  Final   Culture PENDING  Incomplete   Report Status PENDING  Incomplete   Medical History: Past Medical History  Diagnosis Date  . Lupus (HCC)   . Rheumatoid arthritis (HCC)   . Shortness of breath dyspnea   . Anxiety   . Kidney abscess 08/2014  . GERD (gastroesophageal reflux disease)   . Synovial cyst   . Cat bite 11/10/2014  . Swelling of joint, ankle, right 11/10/2014   Anti-infectives     Start     Dose/Rate Route Frequency Ordered Stop   12/31/14 2200  vancomycin (VANCOCIN) IVPB 750 mg/150 ml premix     750 mg 150 mL/hr over 60 Minutes Intravenous Every 12 hours 12/31/14 0757     12/31/14 2100  levofloxacin (LEVAQUIN) IVPB 750 mg     750 mg 100 mL/hr over 90 Minutes Intravenous Every 24 hours 12/31/14 0752     12/31/14 1000  vancomycin (VANCOCIN) 500 mg in sodium chloride 0.9 % 100 mL IVPB    Comments:  * GIVE IN ADDITION TO 1000MG  DOSE ALREADY GIVEN THIS AM TO MAKE A TOTAL DOSE OF 1500MG  THIS MORNING *   500 mg 100 mL/hr over 60 Minutes Intravenous  Once 12/31/14 0754     12/31/14 0515  vancomycin (VANCOCIN) IVPB 1000 mg/200 mL premix     1,000 mg 200 mL/hr over 60 Minutes Intravenous  Once 12/31/14 0506 12/31/14 0620   12/31/14 0514  oseltamivir (TAMIFLU) capsule 75 mg     75 mg Oral Daily 12/31/14 0459 01/06/15 0559   12/31/14 0245  levofloxacin (LEVAQUIN) IVPB 500 mg     500 mg 100 mL/hr over 60 Minutes Intravenous  Once 12/31/14 0241 12/31/14 0407     Assessment: 54yo female with a history of lupus, rheumatoid arthritis on prednisone and methotrexate, GERD, history of perinephric abscess in June 2016. Patient seen for 1 week of upper respiratory symptoms with purulent sputum production. There is symptoms have been getting worse. She  saw her primary care physician and was put on doxycycline.  Goal of Therapy:  Vancomycin trough level 15-20 mcg/ml  Plan:  Levaquin 750mg  IV q24hrs Vancomycin 1500mg  loading dose then Vancomycin 750mg  IV q12hrs Check trough at steady state Monitor labs, renal fxn, and c/s Deescalate ABX when improved / appropriate  A 12/31/2014,7:57 AM

## 2015-01-01 DIAGNOSIS — M329 Systemic lupus erythematosus, unspecified: Secondary | ICD-10-CM

## 2015-01-01 LAB — CBC
HCT: 35.7 % — ABNORMAL LOW (ref 36.0–46.0)
Hemoglobin: 11.8 g/dL — ABNORMAL LOW (ref 12.0–15.0)
MCH: 30.5 pg (ref 26.0–34.0)
MCHC: 33.1 g/dL (ref 30.0–36.0)
MCV: 92.2 fL (ref 78.0–100.0)
PLATELETS: 250 10*3/uL (ref 150–400)
RBC: 3.87 MIL/uL (ref 3.87–5.11)
RDW: 14.3 % (ref 11.5–15.5)
WBC: 10.5 10*3/uL (ref 4.0–10.5)

## 2015-01-01 LAB — URINE CULTURE

## 2015-01-01 LAB — BASIC METABOLIC PANEL
Anion gap: 7 (ref 5–15)
BUN: 8 mg/dL (ref 6–20)
CALCIUM: 8.9 mg/dL (ref 8.9–10.3)
CO2: 26 mmol/L (ref 22–32)
CREATININE: 0.61 mg/dL (ref 0.44–1.00)
Chloride: 111 mmol/L (ref 101–111)
Glucose, Bld: 112 mg/dL — ABNORMAL HIGH (ref 65–99)
Potassium: 3.1 mmol/L — ABNORMAL LOW (ref 3.5–5.1)
SODIUM: 144 mmol/L (ref 135–145)

## 2015-01-01 LAB — HIV ANTIBODY (ROUTINE TESTING W REFLEX): HIV SCREEN 4TH GENERATION: NONREACTIVE

## 2015-01-01 MED ORDER — LEVOFLOXACIN 750 MG PO TABS: 750.0000 mg | ORAL_TABLET | ORAL | Status: DC

## 2015-01-01 MED ORDER — POTASSIUM CHLORIDE CRYS ER 20 MEQ PO TBCR
40.0000 meq | EXTENDED_RELEASE_TABLET | Freq: Once | ORAL | Status: AC
  Administered 2015-01-01: 40 meq via ORAL
  Filled 2015-01-01: qty 2

## 2015-01-01 NOTE — Progress Notes (Signed)
PHARMACIST - PHYSICIAN COMMUNICATION DR:   TRH CONCERNING: Antibiotic IV to Oral Route Change Policy  RECOMMENDATION: This patient is receiving Levaquin by the intravenous route.  Based on criteria approved by the Pharmacy and Therapeutics Committee, the antibiotic(s) is/are being converted to the equivalent oral dose form(s).   DESCRIPTION: These criteria include:  Patient being treated for a respiratory tract infection, urinary tract infection, cellulitis or clostridium difficile associated diarrhea if on metronidazole  The patient is not neutropenic and does not exhibit a GI malabsorption state  The patient is eating (either orally or via tube) and/or has been taking other orally administered medications for a least 24 hours  The patient is improving clinically and has a Tmax < 100.5  If you have questions about this conversion, please contact the Pharmacy Department  [x]   [phone] )  ( [phone] []   [phone] )  [facility] []   [phone] )  [facility] []   [phone] )  [facility] []   [phone] )  [facility]   ( [phone], [facility]  01/01/2015 12:07 PM

## 2015-01-01 NOTE — Discharge Summary (Signed)
Physician Discharge Summary  Deanna Ball OPF:[id number] DOB: July 09, 1960 DOA: 12/30/2014  PCP: Colon Branch, MD  Admit date: 12/30/2014 Discharge date: 01/01/2015  Recommendations for Outpatient Follow-up:  1. Follow up with PCP in 1-2 weeks for resolution of pneumonia =  Follow-up Information    Follow up with Colon Branch, MD In 1 week.   Specialty:  Internal Medicine   Contact information:   [address] [place] [address] [phone]      Discharge Diagnoses:  1. Possible sepsis secondary to pneumonia 2. Possible CAP. 3. Lupus and RA. 4. Elevated AP and AST, hx of similar in the past.  5. S/p perinephric abcess, MRSA bacteremia 09/2014.   Discharge Condition: Improved Disposition: Home  Diet recommendation: Heart-healthy  Filed Weights   12/30/14 2359 12/31/14 0617  Weight: 67.586 kg (149 lb) 69.673 kg (153 lb 9.6 oz)    History of present illness:  54 y.o. female with a history of lupus, rheumatoid arthritis on prednisone and methotrexate, and GERD presented with 1 week hx of upper respiratory symptoms including a cough with purulent sputum production, fever, chills, and chest pressure. Patient saw her PCP 1 week ago and was prescribed Doxycycline. She took two doses but stopped because it didn't agree with her Methotrexate. While in the ED, the patient became hypotensive with systolic blood pressure in the 90s which responded minimally to 3 L of IV fluids. Admitted for further management.   Hospital Course:  Possible sepsis with related hypotension resolved with aggressive IVFs and IV abx. Patient remained afebrile. BC are still pending however show no growth to date. Possible CAP was treated with empiric abx. CXR was unremarkable. SOB is resolved and cough is significantly improved with no more sputum production. She remained stable on room air.  1. Possible sepsis with hypotension, resolved. Afebrile. BC pending. UC unrevealing. 2. Possible CAP,  appears resolved. Immunocompromised. CXR unremarkable. Flu negative. No hypoxia on RA. Cough improved.  3. Lupus and RA. On Methotrexate and Prednisone as outpatient.  4. Elevated AP and AST, hx of similar in the past. .  5. S/p perinephric abcess, MRSA bacteremia 09/2014.  Consultants:  none  Procedures:  none  Antibiotics:  Levaquin 10/12>>10/18  Vancomycin 10/12>>10/12  Discharge Instructions   Discharge Medication List as of 01/01/2015  1:56 PM    START taking these medications   Details  levofloxacin (LEVAQUIN) 750 MG tablet Take 1 tablet (750 mg total) by mouth daily., Starting 01/01/2015, Until Discontinued, Normal      CONTINUE these medications which have NOT CHANGED   Details  ALPRAZolam (XANAX) 1 MG tablet Take 1 tablet (1 mg total) by mouth 2 (two) times daily., Starting 08/29/2014, Until Discontinued, Print    diphenhydrAMINE (BENADRYL) 25 MG tablet Take 25 mg by mouth every 6 (six) hours as needed for allergies or sleep., Until Discontinued, Historical Med    folic acid (FOLVITE) 1 MG tablet Take 1 tablet (1 mg total) by mouth daily., Starting 08/29/2014, Until Discontinued, No Print    HYDROcodone-acetaminophen (NORCO/VICODIN) 5-325 MG per tablet Take 1 tablet by mouth every 6 (six) hours as needed for moderate pain., Until Discontinued, Historical Med    methotrexate (RHEUMATREX) 2.5 MG tablet Take 10 tablets (25 mg total) by mouth 2 (two) times a week. Caution:Chemotherapy. Protect from light., Starting 10/02/2014, Until Discontinued, No Print    NEXIUM 40 MG capsule Take 40 mg by mouth daily. , Starting 07/17/2014, Until Discontinued, Historical Med    predniSONE (DELTASONE)  2.5 MG tablet Take 2.5 mg by mouth 2 (two) times daily with a meal., Until Discontinued, Historical Med    senna (SENOKOT) 8.6 MG TABS tablet Take 2 tablets (17.2 mg total) by mouth at bedtime., Starting 08/29/2014, Until Discontinued, Normal    thiamine (VITAMIN B-1) 100 MG tablet  Take 100 mg by mouth daily., Until Discontinued, Historical Med    vitamin B-12 (CYANOCOBALAMIN) 100 MCG tablet Take 100 mcg by mouth daily., Until Discontinued, Historical Med    Vitamin D, Ergocalciferol, (DRISDOL) 50000 UNITS CAPS capsule Take 50,000 Units by mouth every 7 (seven) days., Until Discontinued, Historical Med       No Known Allergies  The results of significant diagnostics from this hospitalization (including imaging, microbiology, ancillary and laboratory) are listed below for reference.    Significant Diagnostic Studies: Dg Chest 2 View  12/31/2014  CLINICAL DATA:  54 year old female with fever chills and cough for 3 days. Initial encounter. EXAM: CHEST  2 VIEW COMPARISON:  [facility] portable chest radiograph 10/20/2014 and earlier. FINDINGS: Lung volumes remain within normal limits. Normal cardiac size and mediastinal contours. Visualized tracheal air column is within normal limits. Chronic increased pulmonary interstitium appears stable. No pneumothorax, pulmonary edema, pleural effusion or confluent pulmonary opacity. No acute osseous abnormality identified. EKG leads and wires project over the chest. IMPRESSION: No acute cardiopulmonary abnormality. Electronically Signed   By: Odessa Fleming M.D.   On: 12/31/2014 01:12    Microbiology: Recent Results (from the past 240 hour(s))  Blood culture (routine x 2)     Status: None (Preliminary result)   Collection Time: 12/31/14 12:00 AM  Result Value Ref Range Status   Specimen Description BLOOD LEFT ANTECUBITAL DRAWN BY RN  Final   Special Requests BOTTLES DRAWN AEROBIC AND ANAEROBIC 6CC EACH  Final   Culture NO GROWTH 1 DAY  Final   Report Status PENDING  Incomplete  Urine culture     Status: None   Collection Time: 12/31/14 12:01 AM  Result Value Ref Range Status   Specimen Description URINE, CLEAN CATCH  Final   Special Requests NONE  Final   Culture   Final    MULTIPLE SPECIES PRESENT, SUGGEST  RECOLLECTION Performed at [facility]    Report Status 01/01/2015 FINAL  Final  Blood culture (routine x 2)     Status: None (Preliminary result)   Collection Time: 12/31/14 12:12 AM  Result Value Ref Range Status   Specimen Description BLOOD RIGHT ANTECUBITAL  Final   Special Requests BOTTLES DRAWN AEROBIC AND ANAEROBIC 6CC EACH  Final   Culture NO GROWTH 1 DAY  Final   Report Status PENDING  Incomplete  MRSA PCR Screening     Status: None   Collection Time: 12/31/14  9:59 AM  Result Value Ref Range Status   MRSA by PCR NEGATIVE NEGATIVE Final    Comment:        The GeneXpert MRSA Assay (FDA approved for NASAL specimens only), is one component of a comprehensive MRSA colonization surveillance program. It is not intended to diagnose MRSA infection nor to guide or monitor treatment for MRSA infections.      Labs: Basic Metabolic Panel:  Recent Labs Lab 12/31/14 01/01/15 0620  NA 135 144  K 3.5 3.1*  CL 103 111  CO2 25 26  GLUCOSE 112* 112*  BUN 19 8  CREATININE 0.79 0.61  CALCIUM 9.1 8.9   Liver Function Tests:  Recent Labs Lab 12/31/14  AST 66*  ALT 49  ALKPHOS 294*  BILITOT 0.8  PROT 7.5  ALBUMIN 3.5   CBC:  Recent Labs Lab 12/31/14 01/01/15 0620  WBC 11.4* 10.5  NEUTROABS 9.3*  --   HGB 13.1 11.8*  HCT 38.9 35.7*  MCV 90.9 92.2  PLT 228 250   Principal Problem:   Sepsis (HCC) Active Problems:   Lupus (HCC)   Rheumatoid arthritis (HCC)   Community acquired pneumonia   Time coordinating discharge: 35 minutes  Signed:  Brendia Sacks, MD [facility] 01/01/2015, 1:13 PM   By signing my name below, I, Burnett Harry attest that this documentation has been prepared under the direction and in the presence of Brendia Sacks, MD Electronically signed: Burnett Harry, Scribe.  01/01/2015 1:07pm  I personally performed the services described in this documentation. All medical record entries made by the scribe were at  my direction. I have reviewed the chart and agree that the record reflects my personal performance and is accurate and complete. Brendia Sacks, MD

## 2015-01-01 NOTE — Progress Notes (Signed)
Patient discharged home.  IVs removed - WNL.  Reviewed DC instructions and changes to meds.  Patient states she already has appt in place with PCP.  Verbalizes understanding.  No questions at this time.  Awaiting arrival of ride and will then bee assisted off unit.

## 2015-01-01 NOTE — Progress Notes (Signed)
PROGRESS NOTE  Deanna Ball SWF:[id number] DOB: 1960/03/27 DOA: 12/30/2014 PCP: Colon Branch, MD  Summary: 54 y.o. female with a history of lupus, rheumatoid arthritis on prednisone and methotrexate, and GERD presented with 1 week hx of upper respiratory symptoms including a cough with purulent sputum production, fever, chills, and chest pressure. Patient saw her PCP 1 week ago and was prescribed Doxycycline. She took two doses but stopped because it didn't agree with her Methotrexate. While in the ED, the patient became hypotensive with systolic blood pressure in the 90s which responded minimally to 3 L of IV fluids. Admitted for further management.   Assessment/Plan: 1. Possible sepsis with hypotension, resolved. Afebrile. BC pending. UC unrevealing. 2. Possible CAP, appears resolved. Immunocompromised. CXR unremarkable. Flu negative. No hypoxia on RA. Cough improved.  3. Lupus and RA. On Methotrexate and Prednisone as outpatient.  4. Elevated AP and AST, hx of similar in the past. .  5. S/p perinephric abcess, MRSA bacteremia 09/2014.   Overall improved. Discharge today on oral abx.  Code Status: DNR DVT prophylaxis: Lovenox Family Communication: No family at bedside. Discussed with patient who understands and has no concerns at this time. Disposition Plan: Discharge today  Brendia Sacks, MD  [facility]  Pager [phone] If 7PM-7AM, please contact night-coverage at [email or web address], password [facility] 01/01/2015, 7:03 AM  LOS: 1 day   Consultants:    Procedures:    Antibiotics:  Levaquin 10/12>>10/18  Vancomycin 10/12>>10/12  HPI/Subjective: Feels great. Has a mild nonproductive cough but denies any SOB or CP.   Objective: Filed Vitals:   12/31/14 1315 12/31/14 1433 12/31/14 2130 01/01/15 0606  BP: 105/43  105/56 121/58  Pulse: 78  73 67  Temp: 98.4 F (36.9 C)  98.1 F (36.7 C) 97.5 F (36.4 C)  TempSrc:   Oral Oral  Resp: 18  20 20   Height:        Weight:      SpO2: 97% 99% 98% 98%    Intake/Output Summary (Last 24 hours) at 01/01/15 0703 Last data filed at 12/31/14 1700  Gross per 24 hour  Intake    720 ml  Output    750 ml  Net    -30 ml     Filed Weights   12/30/14 2359 12/31/14 0617  Weight: 67.586 kg (149 lb) 69.673 kg (153 lb 9.6 oz)    Exam:    VSS, afebrile, not hypoxic General:  Appears calm and comfortable Cardiovascular: RRR, no m/r/g. No LE edema. Respiratory: CTA bilaterally, no w/r/r. Normal respiratory effort. Abdomen: soft, ntnd Psychiatric: grossly normal mood and affect, speech fluent and appropriate  New data reviewed:  WBC normalized 10.5, Hgb 11.8  Potassium 3.1   Pertinent data since admission:  CXR; no acute disease  WBC 11.4  Pending data:  BC/ UC  Scheduled Meds: . ALPRAZolam  1 mg Oral BID  . enoxaparin (LOVENOX) injection  40 mg Subcutaneous Q24H  . hydrocortisone sod succinate (SOLU-CORTEF) inj  100 mg Intravenous Q12H  . levofloxacin (LEVAQUIN) IV  750 mg Intravenous Q24H  . oseltamivir  75 mg Oral Daily  . pantoprazole  80 mg Oral Q1200   Continuous Infusions:   Principal Problem:   Sepsis (HCC) Active Problems:   Lupus (HCC)   Rheumatoid arthritis (HCC)   Community acquired pneumonia    By signing my name below, I, 03/02/15 attest that this documentation has been prepared under the direction and in the presence of Burnett Harry, MD Electronically signed:  Burnett Harry, Neurosurgeon. 01/01/2015 1:07pm  I personally performed the services described in this documentation. All medical record entries made by the scribe were at my direction. I have reviewed the chart and agree that the record reflects my personal performance and is accurate and complete. Brendia Sacks, MD

## 2015-01-01 NOTE — Care Management Note (Signed)
Case Management Note  Patient Details  Name: KANA REIMANN MRN: [id number] Date of Birth: 11/13/1960  Subjective/Objective:                  Pt admitted from home with sepsis/SIRS. Pt lives alone and will return home at discharge. Pt is independent with ADL's. Pt has a rollator for home use.  Action/Plan: No CM needs anticipated.  Expected Discharge Date:                  Expected Discharge Plan:  Home/Self Care  In-House Referral:  NA  Discharge planning Services  CM Consult  Post Acute Care Choice:  NA Choice offered to:  NA  DME Arranged:    DME Agency:     HH Arranged:    HH Agency:     Status of Service:  Completed, signed off  Medicare Important Message Given:    Date Medicare IM Given:    Medicare IM give by:    Date Additional Medicare IM Given:    Additional Medicare Important Message give by:     If discussed at Long Length of Stay Meetings, dates discussed:    Additional Comments:  Cheryl Flash, RN 01/01/2015, 9:34 AM

## 2015-01-05 LAB — CULTURE, BLOOD (ROUTINE X 2)
CULTURE: NO GROWTH
CULTURE: NO GROWTH

## 2015-07-26 ENCOUNTER — Encounter (HOSPITAL_COMMUNITY): Payer: Self-pay | Admitting: Emergency Medicine

## 2015-07-26 ENCOUNTER — Emergency Department (HOSPITAL_COMMUNITY)
Admission: EM | Admit: 2015-07-26 | Discharge: 2015-07-26 | Disposition: A | Discharge status: Home / Self Care | Payer: Medicare Other | Attending: Emergency Medicine | Admitting: Emergency Medicine

## 2015-07-26 DIAGNOSIS — Z87891 Personal history of nicotine dependence: Secondary | ICD-10-CM | POA: Diagnosis not present

## 2015-07-26 DIAGNOSIS — L089 Local infection of the skin and subcutaneous tissue, unspecified: Secondary | ICD-10-CM | POA: Insufficient documentation

## 2015-07-26 DIAGNOSIS — Y999 Unspecified external cause status: Secondary | ICD-10-CM | POA: Insufficient documentation

## 2015-07-26 DIAGNOSIS — M069 Rheumatoid arthritis, unspecified: Secondary | ICD-10-CM | POA: Diagnosis not present

## 2015-07-26 DIAGNOSIS — Z79899 Other long term (current) drug therapy: Secondary | ICD-10-CM | POA: Insufficient documentation

## 2015-07-26 DIAGNOSIS — W57XXXA Bitten or stung by nonvenomous insect and other nonvenomous arthropods, initial encounter: Secondary | ICD-10-CM | POA: Insufficient documentation

## 2015-07-26 DIAGNOSIS — S30861A Insect bite (nonvenomous) of abdominal wall, initial encounter: Secondary | ICD-10-CM | POA: Insufficient documentation

## 2015-07-26 DIAGNOSIS — Y929 Unspecified place or not applicable: Secondary | ICD-10-CM | POA: Diagnosis not present

## 2015-07-26 DIAGNOSIS — Y939 Activity, unspecified: Secondary | ICD-10-CM | POA: Diagnosis not present

## 2015-07-26 MED ORDER — CEPHALEXIN 500 MG PO CAPS: 500.0000 mg | ORAL_CAPSULE | Freq: Four times a day (QID) | ORAL | Status: DC

## 2015-07-26 MED ORDER — CEFTRIAXONE SODIUM 1 G IJ SOLR
1.0000 g | Freq: Once | INTRAMUSCULAR | Status: AC
  Administered 2015-07-26: 1 g via INTRAVENOUS
  Filled 2015-07-26: qty 10

## 2015-07-26 NOTE — ED Provider Notes (Signed)
CSN: [id number]     Arrival date & time 07/26/15  1019 History  By signing my name below, I, Soijett Blue, attest that this documentation has been prepared under the direction and in the presence of Nelva Nay, MD. Electronically Signed: Soijett Blue, ED Scribe. 07/26/2015. 11:09 AM.   Chief Complaint  Patient presents with  . Insect Bite      The history is provided by the patient. No language interpreter was used.     Deanna Ball is a 55 y.o. female with a  PMHx of lupus, who presents to the Emergency Department complaining of an insect bite onset 4 days. Pt notes that she was in her yard when she noticed that she was bit by an insect. Pt is having associated symptoms of redness to RLQ. Pt reports that she has not tried any medications for the relief of her symptoms. Pt denies and any other symptoms.  Past Medical History  Diagnosis Date  . Lupus (HCC)   . Rheumatoid arthritis (HCC)   . Shortness of breath dyspnea   . Anxiety   . Kidney abscess 08/2014  . GERD (gastroesophageal reflux disease)   . Synovial cyst   . Cat bite 11/10/2014  . Swelling of joint, ankle, right 11/10/2014   Past Surgical History  Procedure Laterality Date  . Hernia repair    . Appendectomy    . Lumbar fusion    . Kidney stent placement    . Tee without cardioversion N/A 08/25/2014    Procedure: TRANSESOPHAGEAL ECHOCARDIOGRAM (TEE);  Surgeon: Thurmon Fair, MD;  Location: [facility] ENDOSCOPY;  Service: Cardiovascular;  Laterality: N/A;  . Peripherally inserted central catheter insertion  08/2014   Family History  Problem Relation Age of Onset  . Lupus Maternal Uncle    Social History  Substance Use Topics  . Smoking status: Former Smoker -- 0.50 packs/day for 45 years    Types: Cigarettes    Quit date: 09/02/2014  . Smokeless tobacco: Never Used  . Alcohol Use: No     Comment: patient drinks 2 bottles of wild irish rose wine last drink 4 days ago   09/2014 I have not drank in 2 months "   OB  History    No data available     Review of Systems  Constitutional: Negative for fever.  Skin: Positive for color change. Negative for wound.  All other systems reviewed and are negative.     Allergies  Levaquin  Home Medications   Prior to Admission medications   Medication Sig Start Date End Date Taking? Authorizing Provider  ALPRAZolam Prudy Feeler) 1 MG tablet Take 1 tablet (1 mg total) by mouth 2 (two) times daily. 08/29/14  Yes Kathlen Mody, MD  diphenhydrAMINE (BENADRYL) 25 MG tablet Take 25 mg by mouth every 6 (six) hours as needed for allergies or sleep.   Yes Historical Provider, MD  folic acid (FOLVITE) 1 MG tablet Take 1 tablet (1 mg total) by mouth daily. 08/29/14  Yes Kathlen Mody, MD  HYDROcodone-acetaminophen (NORCO/VICODIN) 5-325 MG per tablet Take 1 tablet by mouth every 6 (six) hours as needed for moderate pain.   Yes Historical Provider, MD  methotrexate (RHEUMATREX) 2.5 MG tablet Take 10 tablets (25 mg total) by mouth 2 (two) times a week. Caution:Chemotherapy. Protect from light. Patient taking differently: Take 2.5 mg by mouth 2 (two) times a week. Caution:Chemotherapy. Protect from light. Takes on Tuesdays and Thursdays. 10/02/14  Yes Christiane Ha, MD  NEXIUM 40 MG  capsule Take 40 mg by mouth daily.  07/17/14  Yes Historical Provider, MD  predniSONE (DELTASONE) 2.5 MG tablet Take 2.5 mg by mouth 2 (two) times daily with a meal.   Yes Historical Provider, MD  senna (SENOKOT) 8.6 MG TABS tablet Take 2 tablets (17.2 mg total) by mouth at bedtime. Patient taking differently: Take 2 tablets by mouth at bedtime as needed for mild constipation.  08/29/14  Yes Kathlen Mody, MD  Vitamin D, Ergocalciferol, (DRISDOL) 50000 UNITS CAPS capsule Take 50,000 Units by mouth every 30 (thirty) days. Starting new monthly regimen, will start taking on the 1st of the month.   Yes Historical Provider, MD  cephALEXin (KEFLEX) 500 MG capsule Take 1 capsule (500 mg total) by mouth 4 (four)  times daily. 07/26/15   Nelva Nay, MD  levofloxacin (LEVAQUIN) 750 MG tablet Take 1 tablet (750 mg total) by mouth daily. Patient not taking: Reported on 07/26/2015 01/01/15   Standley Brooking, MD   BP 124/70 mmHg  Pulse 70  Temp(Src) 98.1 F (36.7 C) (Oral)  Resp 16  Ht 5\' 2"  (1.575 m)  Wt 146 lb (66.225 kg)  BMI 26.70 kg/m2  SpO2 100% Physical Exam Physical Exam  Nursing note and vitals reviewed. Constitutional: She is oriented to person, place, and time. She appears well-developed and well-nourished. No distress.  HENT:  Head: Normocephalic and atraumatic.  Eyes: Pupils are equal, round, and reactive to light.  Neck: Normal range of motion.  Cardiovascular: Normal rate and intact distal pulses.   Pulmonary/Chest: No respiratory distress.  Abdominal: Normal appearance. She exhibits no distension.  Abdominal wall has redness surrounding an insect bite in the right lower quadrant.  No evidence of abscess.  No tenderness to palpation.   Musculoskeletal: Normal range of motion.  Neurological: She is alert and oriented to person, place, and time. No cranial nerve deficit.  Skin: Skin is warm and dry. No rash noted.  Psychiatric: She has a normal mood and affect. Her behavior is normal.   ED Course  Procedures (including critical care time) Medications  cefTRIAXone (ROCEPHIN) 1 g in dextrose 5 % 50 mL IVPB (0 g Intravenous Stopped 07/26/15 1150)    DIAGNOSTIC STUDIES: Oxygen Saturation is 100% on RA, nl by my interpretation.    COORDINATION OF CARE: 11:07 AM Discussed treatment plan with pt at bedside and pt agreed to plan.     MDM   Final diagnoses:  Insect bite (nonvenomous) of abdominal wall, initial encounter (CODE)  Skin infection   I personally performed the services described in this documentation, which was scribed in my presence. The recorded information has been reviewed and considered.    09/25/15, MD 07/26/15 1344

## 2015-07-26 NOTE — ED Notes (Signed)
Pt reports being bit by an insect on Thursday. Reports fever, redness, and swelling to RT abdomen. States she had a fever of 102 yesterday. Pt receives chemo 2X a week for lupus.

## 2015-07-26 NOTE — Discharge Instructions (Signed)
Insect Bite Mosquitoes, flies, fleas, bedbugs, and many other insects can bite. Insect bites are different from insect stings. A sting is when poison (venom) is injected into the skin. Insect bites can cause pain or itching for a few days, but they are usually not serious. Some insects can spread diseases to people through a bite. SYMPTOMS  Symptoms of an insect bite include:  Itching or pain in the bite area.  Redness and swelling in the bite area.  An open wound (skin ulcer). In many cases, symptoms last for 2-4 days.  DIAGNOSIS  This condition is usually diagnosed based on symptoms and a physical exam. TREATMENT  Treatment is usually not needed for an insect bite. Symptoms often go away on their own. Your health care provider may recommend creams or lotions to help reduce itching. Antibiotic medicines may be prescribed if the bite becomes infected. A tetanus shot may be given in some cases. If you develop an allergic reaction to an insect bite, your health care provider will prescribe medicines to treat the reaction (antihistamines). This is rare. HOME CARE INSTRUCTIONS  Do not scratch the bite area.  Keep the bite area clean and dry. Wash the bite area daily with soap and water as told by your health care provider.  If directed, applyice to the bite area.  Put ice in a plastic bag.  Place a towel between your skin and the bag.  Leave the ice on for 20 minutes, 2-3 times per day.  To help reduce itching and swelling, try applying a baking soda paste, cortisone cream, or calamine lotion to the bite area as told by your health care provider.  Apply or take over-the-counter and prescription medicines only as told by your health care provider.  If you were prescribed an antibiotic medicine, use it as told by your health care provider. Do not stop using the antibiotic even if your condition improves.  Keep all follow-up visits as told by your health care provider. This is  important. PREVENTION   Use insect repellent. The best insect repellents contain:  DEET, picaridin, oil of lemon eucalyptus (OLE), or IR3535.  Higher amounts of an active ingredient.  When you are outdoors, wear clothing that covers your arms and legs.  Avoid opening windows that do not have window screens. SEEK MEDICAL CARE IF:  You have increased redness, swelling, or pain in the bite area.  You have a fever. SEEK IMMEDIATE MEDICAL CARE IF:   You have joint pain.   You have fluid, blood, or pus coming from the bite area.  You have a headache or neck pain.  You have unusual weakness.  You have a rash.  You have chest pain or shortness of breath.  You have abdominal pain, nausea, or vomiting.  You feel unusually tired or sleepy.   This information is not intended to replace advice given to you by your health care provider. Make sure you discuss any questions you have with your health care provider.   Document Released: 04/14/2004 Document Revised: 11/26/2014 Document Reviewed: 07/23/2014 Elsevier Interactive Patient Education 2016 [employer]  Cellulitis Cellulitis is an infection of the skin and the tissue under the skin. The infected area is usually red and tender. This happens most often in the arms and lower legs. HOME CARE   Take your antibiotic medicine as told. Finish the medicine even if you start to feel better.  Keep the infected arm or leg raised (elevated).  Put a warm cloth on  the area up to 4 times per day.  Only take medicines as told by your doctor.  Keep all doctor visits as told. GET HELP IF:  You see red streaks on the skin coming from the infected area.  Your red area gets bigger or turns a dark color.  Your bone or joint under the infected area is painful after the skin heals.  Your infection comes back in the same area or different area.  You have a puffy (swollen) bump in the infected area.  You have new symptoms.  You have  a fever. GET HELP RIGHT AWAY IF:   You feel very sleepy.  You throw up (vomit) or have watery poop (diarrhea).  You feel sick and have muscle aches and pains.   This information is not intended to replace advice given to you by your health care provider. Make sure you discuss any questions you have with your health care provider.   Document Released: 08/24/2007 Document Revised: 11/26/2014 Document Reviewed: 05/23/2011 Elsevier Interactive Patient Education [employer]

## 2015-10-22 IMAGING — CT CT ABDOMEN W/ CM
1 of 3 series · 12 of 32 positions shown, 18 images · IV contrast (omnipaque)
Comparison: Prior CT abdomen and pelvis 08/20/2014

CLINICAL DATA: 54-year-old female with a history of right psoas/
perinephric abscess and secondary proximal ureteral obstruction. She
underwent placement of a percutaneous nephrostomy tube and
retroperitoneal drainage catheter 08/20/2014. She is significantly
clinically improved and her right flank drainage catheter has no
residual output. She appears to be having pain consistent was psoas
muscle spasm related to the catheter position. Evaluate for residual
psoas/perinephric abscess.

EXAM:
CT ABDOMEN WITH CONTRAST
TECHNIQUE: Multidetector CT imaging of the abdomen was performed using the
standard protocol following bolus administration of intravenous
contrast.
CONTRAST:  80mL OMNIPAQUE IOHEXOL 300 MG/ML  SOLN

[Series 2: rtn ap with st · axial · 0.71mm/px · z∈[-244,-38]mm · 12 of 49 slices shown, 18 images]
[im 4/49  soft-tissue]
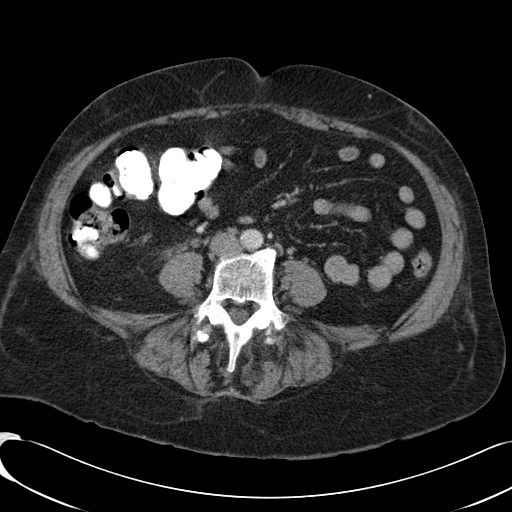
[im 4/49  bone]
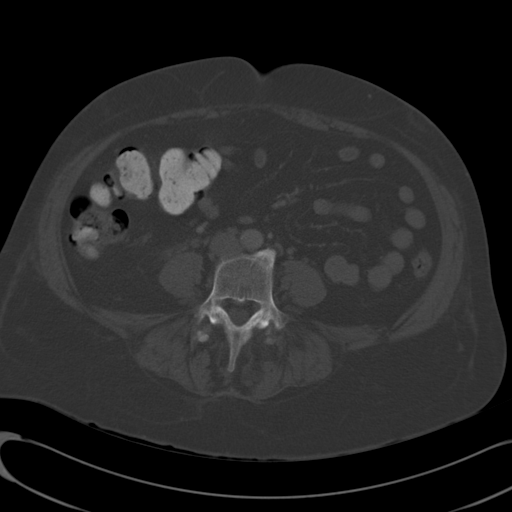
[im 7/49  soft-tissue]
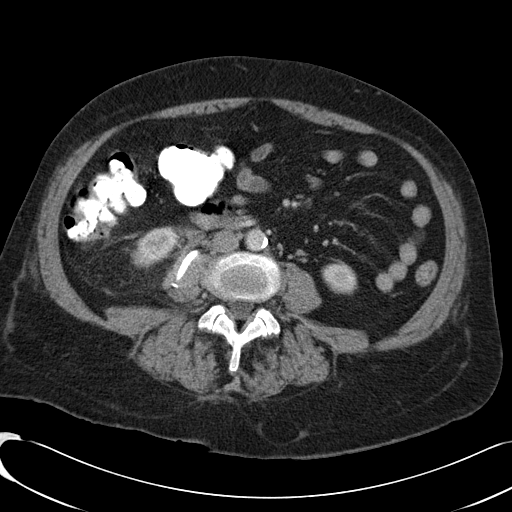
[im 11/49  soft-tissue]
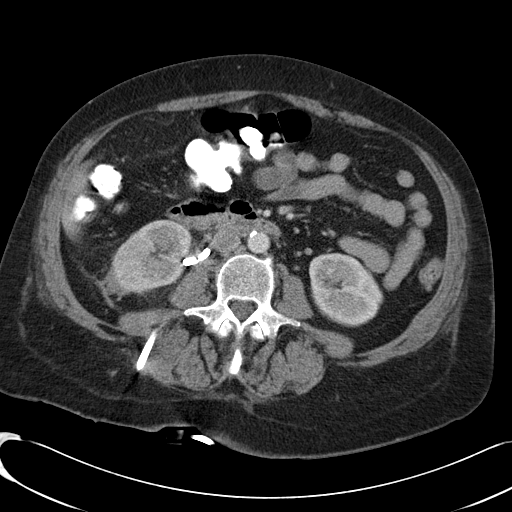
[im 14/49  soft-tissue]
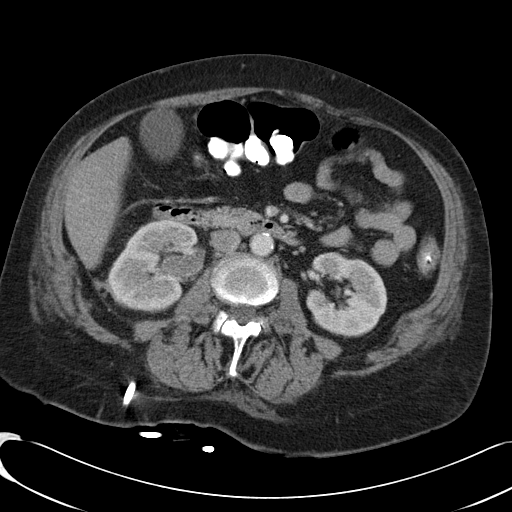
[im 18/49  soft-tissue]
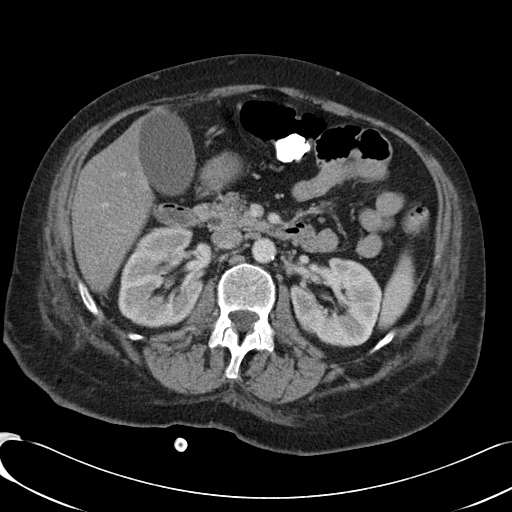
[im 21/49  soft-tissue]
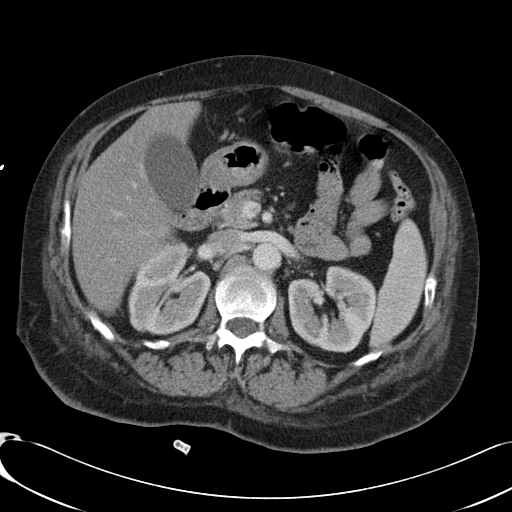
[im 28/49  soft-tissue]
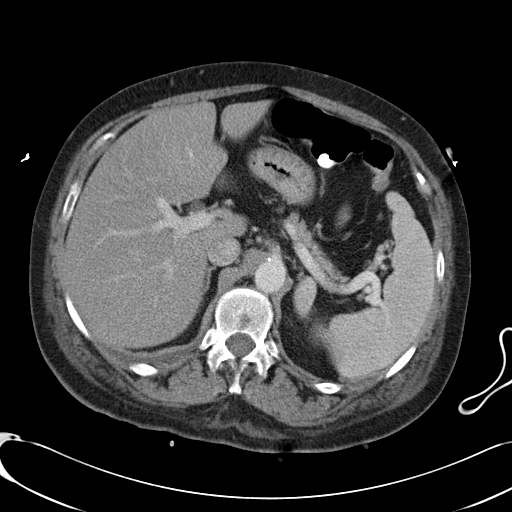
[im 31/49  soft-tissue]
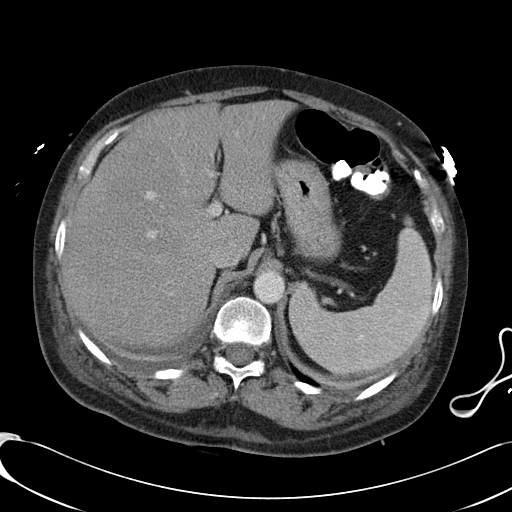
[im 35/49  soft-tissue]
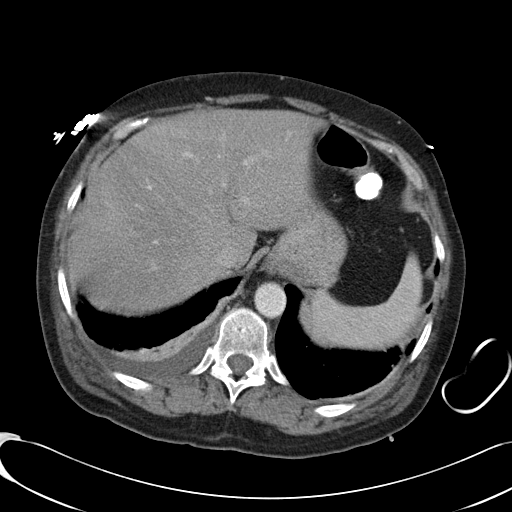
[im 35/49  lung]
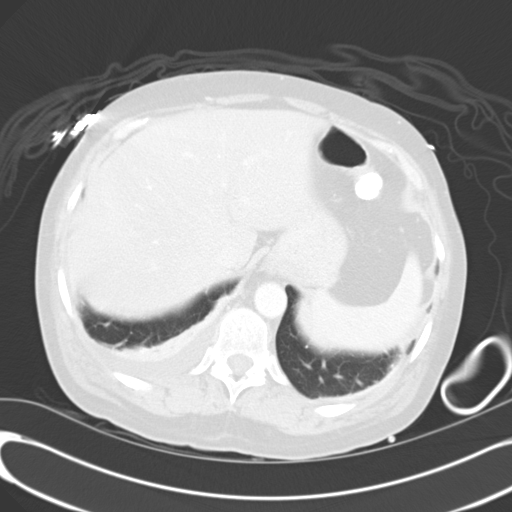
[im 35/49  bone]
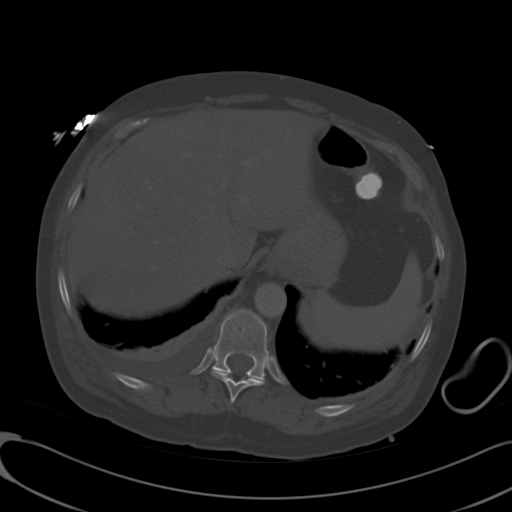
[im 38/49  soft-tissue]
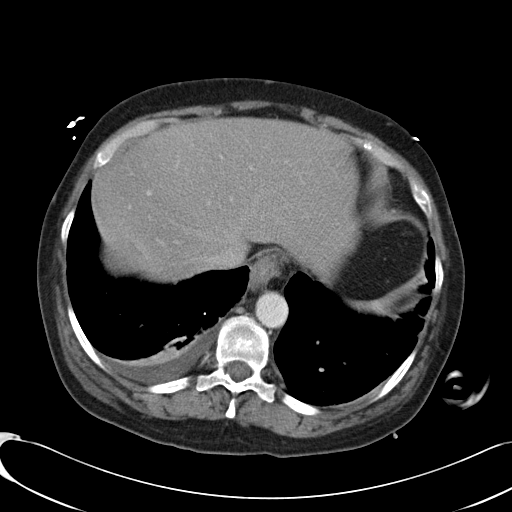
[im 38/49  lung]
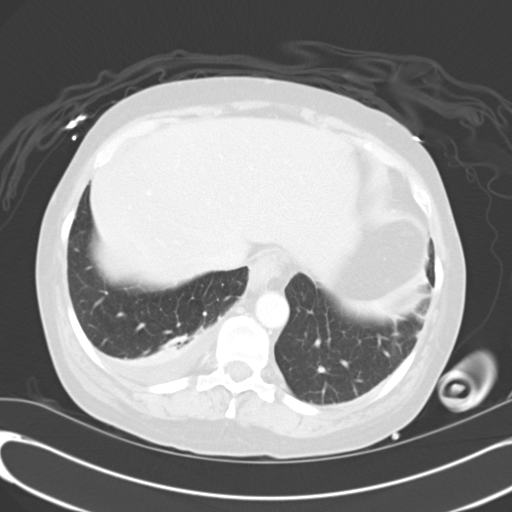
[im 42/49  soft-tissue]
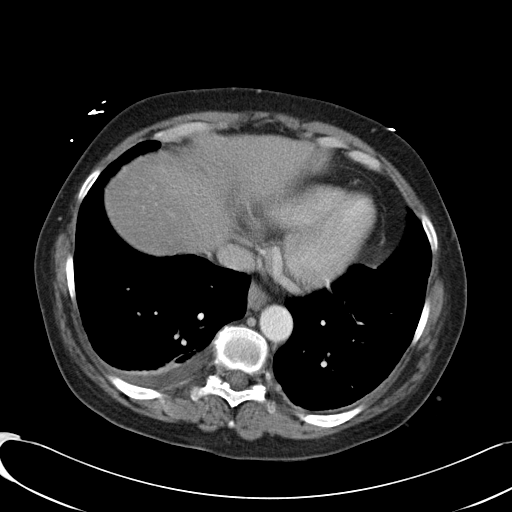
[im 42/49  lung]
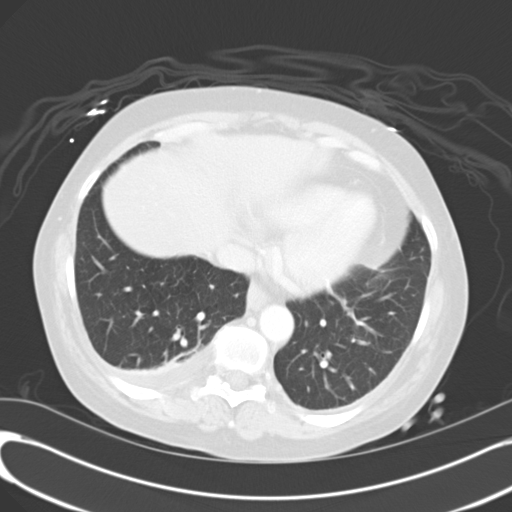
[im 45/49  soft-tissue]
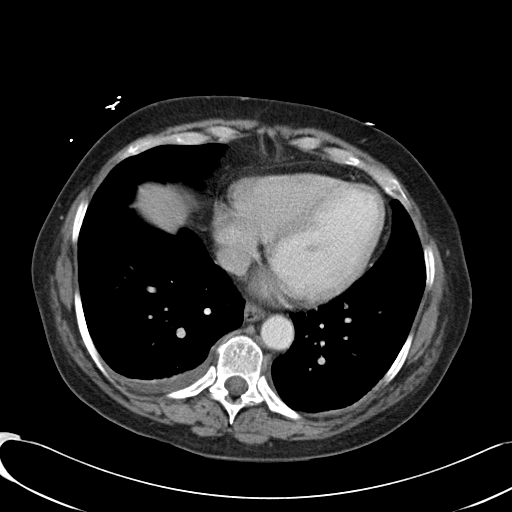
[im 45/49  lung]
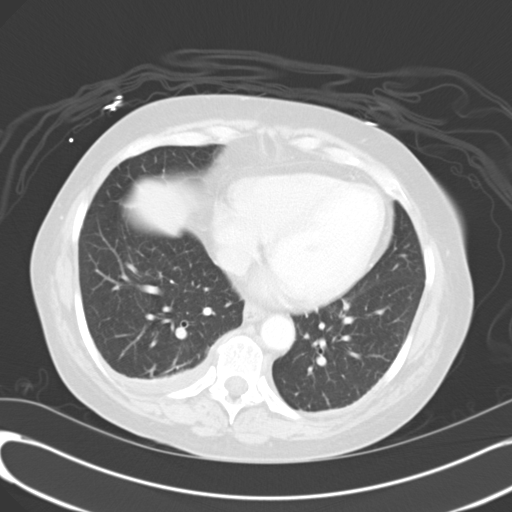

[12 of 32 positions shown; findings below may reference images not displayed]

FINDINGS: Lower Chest: Small layering right pleural effusion with associated
right lower lobe atelectasis. Otherwise, the lung bases are clear.
Visualized cardiac structures within normal limits for size. No
pericardial effusion. Unremarkable distal thoracic esophagus.

Abdomen: Unremarkable CT appearance of the stomach, duodenum,
spleen, adrenal glands and pancreas. Normal hepatic contour and
morphology. No discrete hepatic lesion. Mild low attenuation in the
hepatic parenchyma relative to the spleen is nonspecific on a
contrast-enhanced study but raises the possibility of hepatic
steatosis. Gallbladder is unremarkable. No intra or extrahepatic
biliary ductal dilatation.

Total interval resolution of right-sided hydronephrosis. The right
perinephric/ psoas drainage catheter remains in good position. The
previously identified multiloculated rim enhancing fluid collection
has completely resolved. No residual abscess collection remains. The
ureter is well visualized. The left kidney remains unremarkable. The
visualized bowel is unremarkable.

Bones/Soft Tissues: No acute fracture or aggressive appearing lytic
or blastic osseous lesion.

Vascular: Trace atherosclerotic vascular calcifications.
IMPRESSION: 1. Complete resolution of right hydronephrosis and perinephric/psoas
abscess.
2. Interval development of a small right layering pleural effusion
with associated right lower lobe atelectasis.

PLAN:
The right retroperitoneal abscess drainage catheter was removed.

## 2016-01-05 IMAGING — CR DG ANKLE COMPLETE 3+V*R*
3 series · 3 of 3 positions shown · non-contrast
Comparison: None.

CLINICAL DATA: Cat bite to right ankle 3 days ago and rolling
injury to ankle 2 days ago. Right ankle pain, swelling, and
erythema. Initial encounter.

EXAM:
RIGHT ANKLE - COMPLETE 3+ VIEW

[t ankle joint ap right]
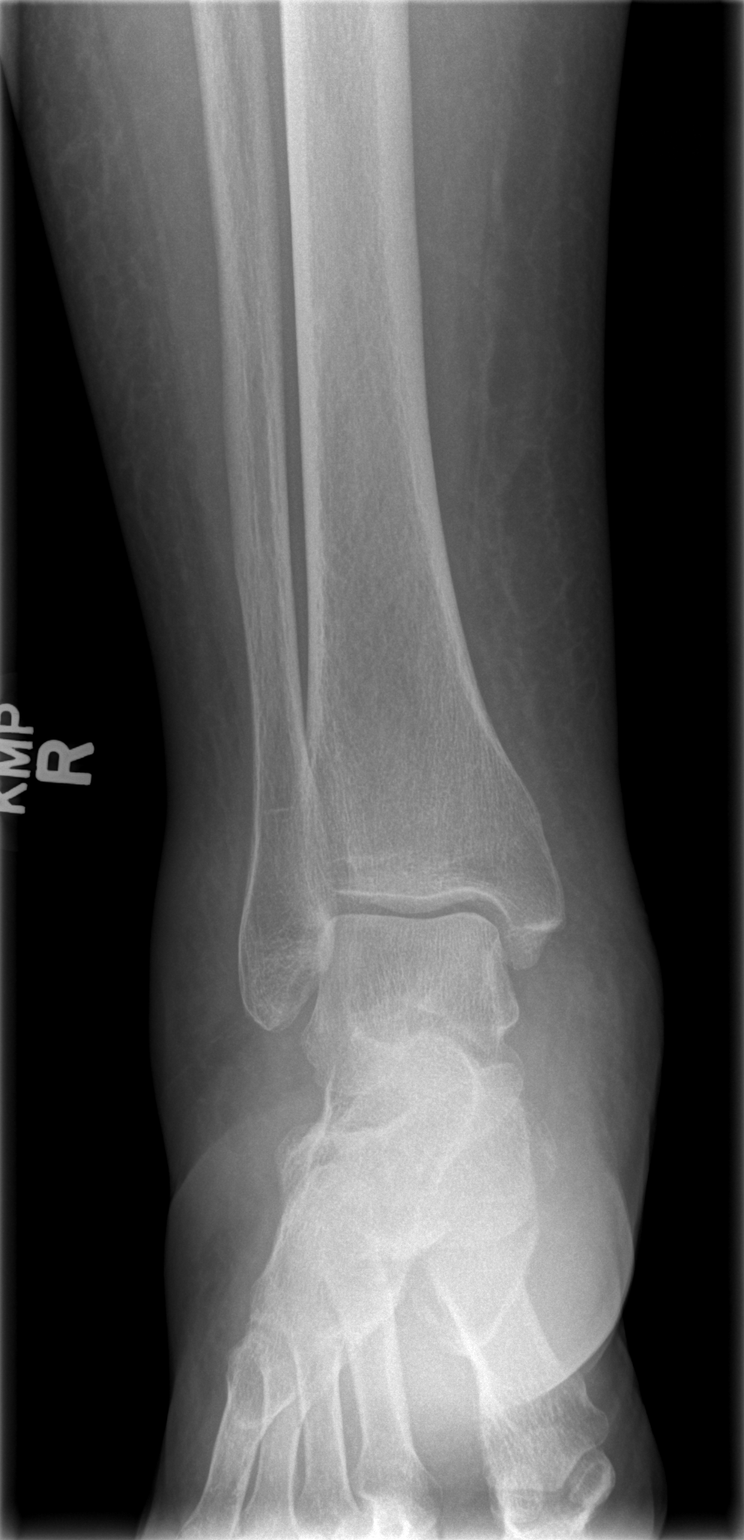

[t ankle joint oblique right]
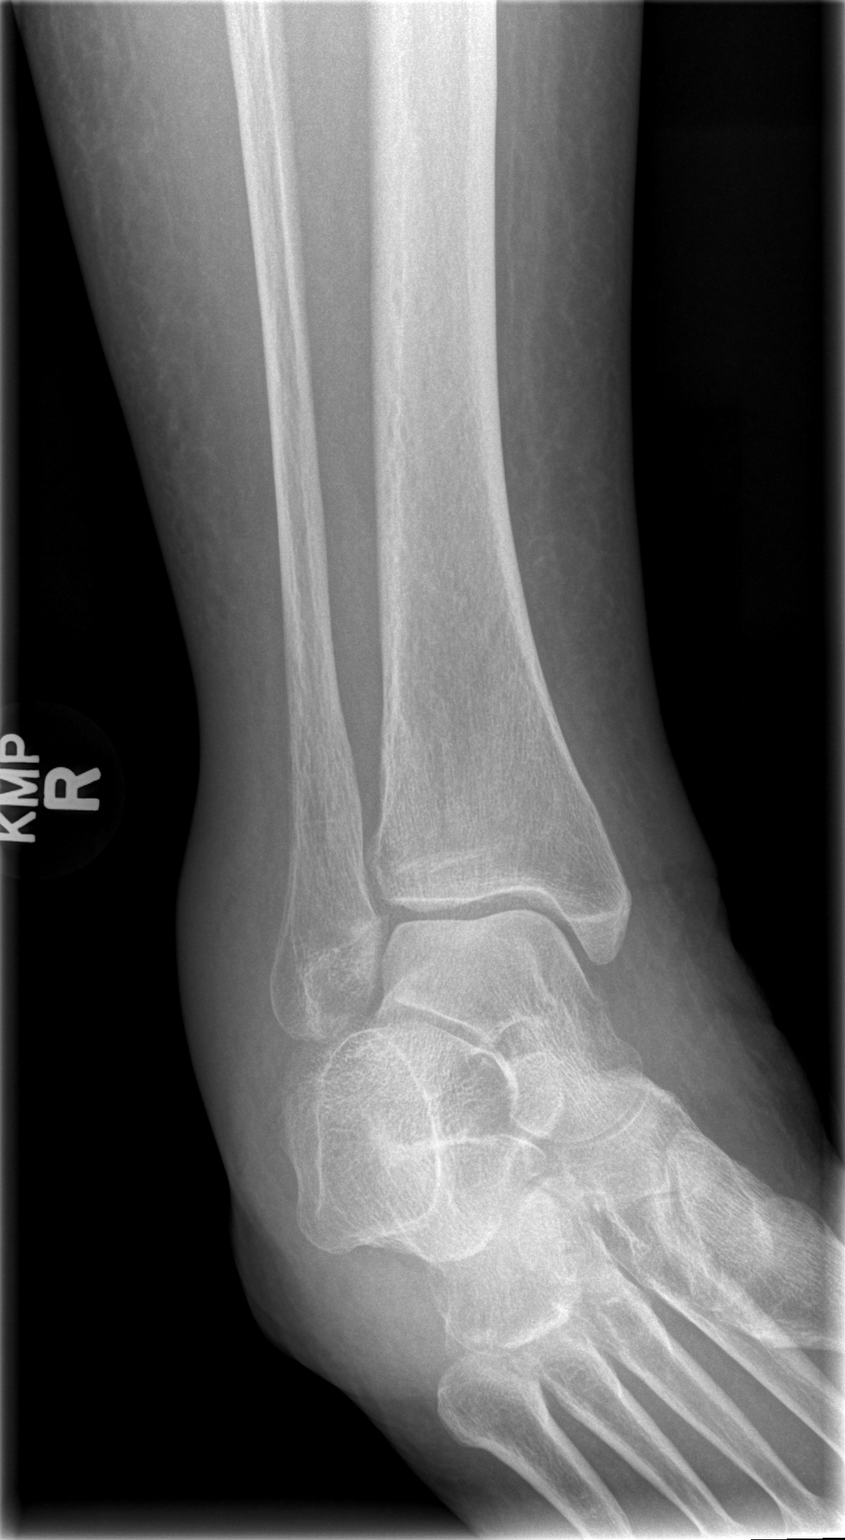

[t ankle joint lat right]
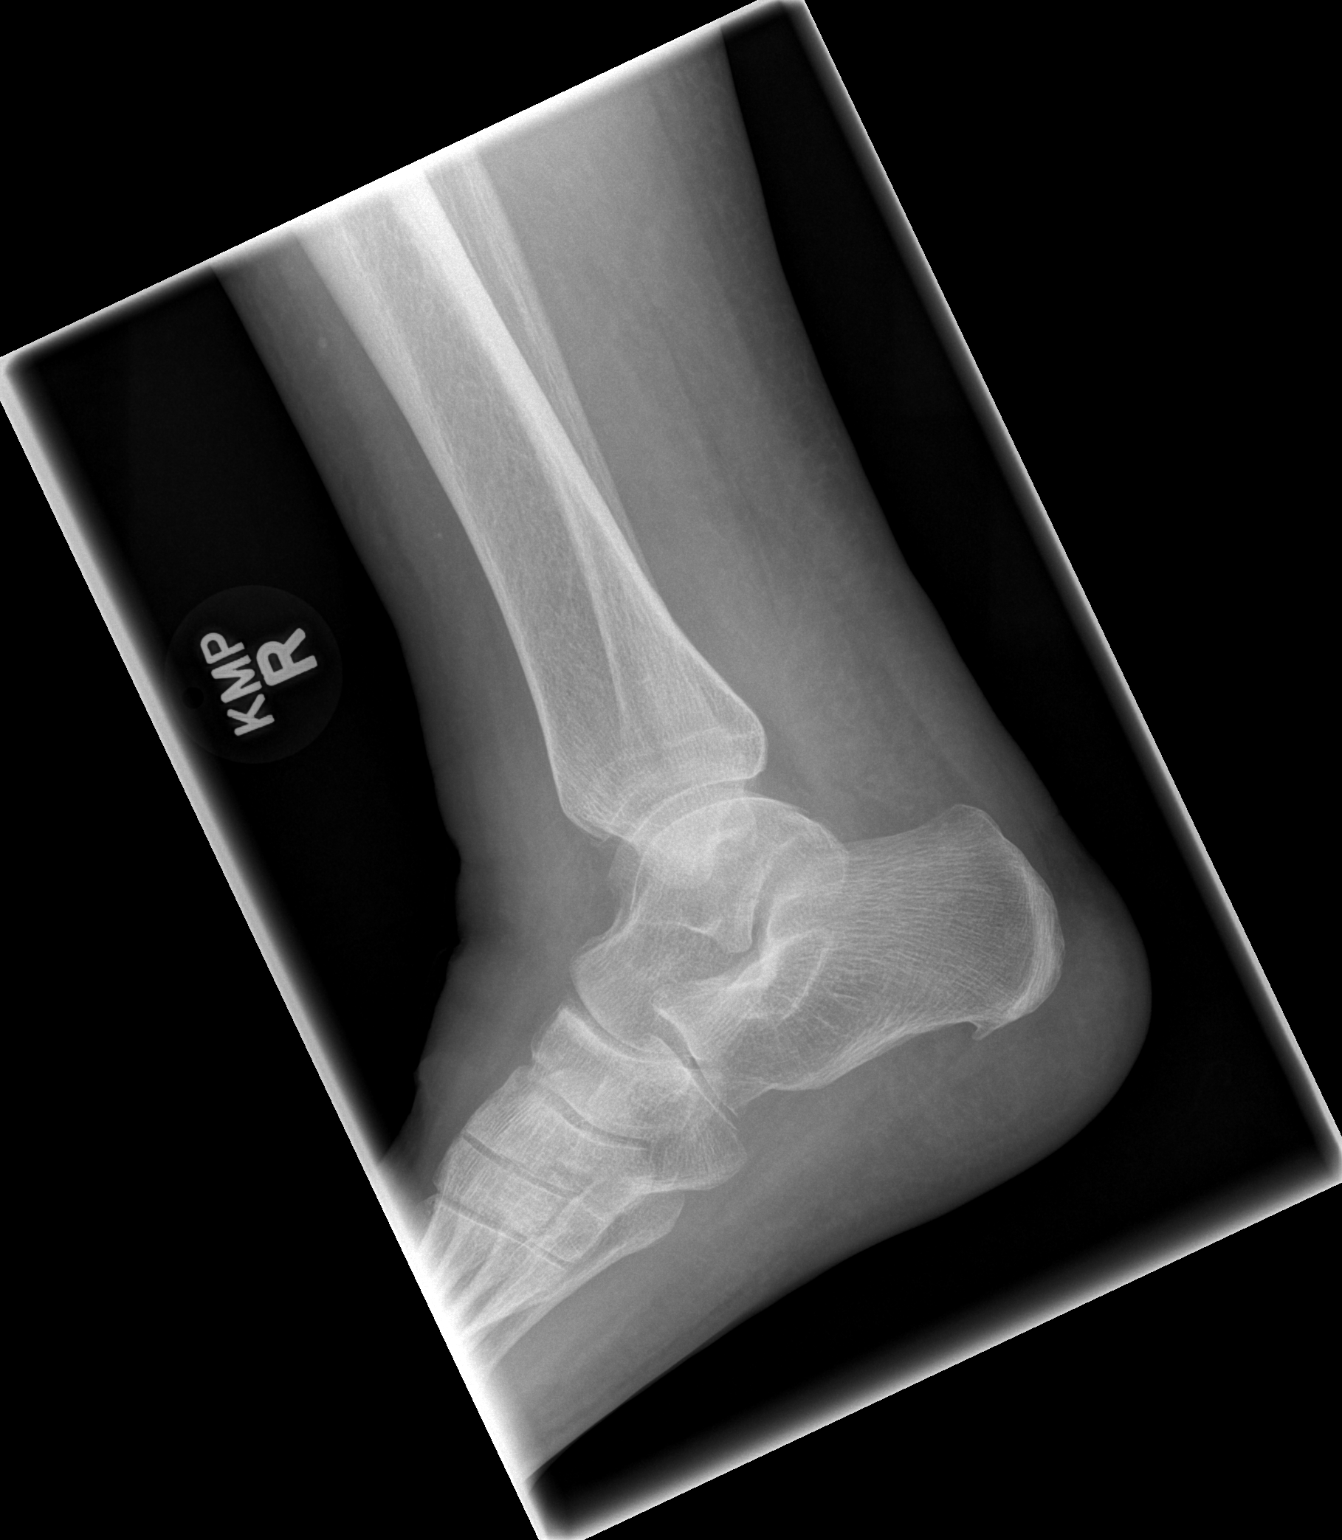

[3 of 3 positions shown; findings below may reference images not displayed]

FINDINGS: There is no evidence of fracture, dislocation, or joint effusion.
There is no evidence of arthropathy or other focal bone abnormality.
Diffuse osteopenia noted. Diffuse soft tissue swelling also
demonstrated.
IMPRESSION: Diffuse ankle soft tissue swelling. No evidence of fracture or
dislocation.

## 2016-03-07 ENCOUNTER — Ambulatory Visit (INDEPENDENT_AMBULATORY_CARE_PROVIDER_SITE_OTHER): Payer: Medicare Other | Admitting: Neurology

## 2016-03-07 ENCOUNTER — Encounter: Payer: Self-pay | Admitting: Neurology

## 2016-03-07 DIAGNOSIS — M5416 Radiculopathy, lumbar region: Secondary | ICD-10-CM | POA: Diagnosis not present

## 2016-03-07 MED ORDER — DIAZEPAM 5 MG PO TABS: 5.0000 mg | ORAL_TABLET | Freq: Four times a day (QID) | ORAL | 0 refills | Status: AC | PRN

## 2016-03-07 MED ORDER — DULOXETINE HCL 60 MG PO CPEP: 60.0000 mg | ORAL_CAPSULE | Freq: Every day | ORAL | 12 refills | Status: AC

## 2016-03-07 NOTE — Progress Notes (Signed)
PATIENT: Deanna Ball DOB: Jan 16, 1961  Chief Complaint  Patient presents with  . Sciatica    Reports pain in low back that radiates into her left hip and down her left leg. Patient has history of back surgery.  Pain has been present for three months.  She had some relief with a Prednisone dose pack.  . PCP    Colon Branch, MD  . Rheumatology    Zenovia Jordan, MD - referring MD     HISTORICAL  Deanna Ball is a 55 years old right-handed female, seen in refer by her rheumatologist Zenovia Jordan for evaluation of left leg left hip pain, initial evaluation was on March 07 2016.  I reviewed and summarized the referring note, she had a history of seronegative rheumatoid arthritis, noted history of lupus, had a history of lumbar congenital malformation, had low back surgery in 1982, prior to the surgery she had severe low back pain, bilateral lower extremity weakness, surgery has been successful. she has been taking prednisone 2.5 milligrams twice a day since 2010,  Plaquenil 200mg  bid since Oct 2017.    Since 2016, she began to have recurrent low back pain, radiating to left hip, left lateral leg, left foot, constant severe pain, left leg numbness, gait abnormality, functional limitations, she is tearful during today's interview, low back pain has put so much limitation on her, she becomes depressed, she denies bowel and bladder incontinence.  We have personally reviewed MRI of lumbar in July 2016, advanced degenerative disc disease at L4-5, fluid-filled facet joints, left side is worse, new development of a synovial cyst measuring 10-12 mm projecting into the canal from the left facet joints, this in coach on the cecal sac on the left side, potential compression of left L5 nerve root.  She is now taking hydrocodone/Tylenol 5/325 mg 4 times a day  REVIEW OF SYSTEMS: Full 14 system review of systems performed and notable only for weight gain, palpitation, swelling legs, loss of  vision, easy bruising, easy bleeding, feeling hot, feeling cold, increased thirst, joint pain, joint swelling, aching muscles, cramps,   ALLERGIES: Allergies  Allergen Reactions  . Levaquin [Levofloxacin] Shortness Of Breath and Rash    Patient states that she went into respiratory distress.    HOME MEDICATIONS: Current Outpatient Prescriptions  Medication Sig Dispense Refill  . ALPRAZolam (XANAX) 1 MG tablet Take 1 tablet (1 mg total) by mouth 2 (two) times daily. 15 tablet 0  . HYDROcodone-acetaminophen (NORCO/VICODIN) 5-325 MG per tablet Take 1 tablet by mouth every 6 (six) hours as needed for moderate pain.    . hydroxychloroquine (PLAQUENIL) 200 MG tablet 2 (two) times daily.    02-11-1991 NEXIUM 40 MG capsule Take 40 mg by mouth daily.     . predniSONE (DELTASONE) 2.5 MG tablet Take 2.5 mg by mouth 2 (two) times daily with a meal.    . senna (SENOKOT) 8.6 MG TABS tablet Take 2 tablets (17.2 mg total) by mouth at bedtime. (Patient taking differently: Take 2 tablets by mouth at bedtime as needed for mild constipation. ) 120 each 0  . Vitamin D, Ergocalciferol, (DRISDOL) 50000 UNITS CAPS capsule Take 50,000 Units by mouth every 30 (thirty) days. Starting new monthly regimen, will start taking on the 1st of the month.     No current facility-administered medications for this visit.     PAST MEDICAL HISTORY: Past Medical History:  Diagnosis Date  . Anxiety   . Back pain   .  Cat bite 11/10/2014  . GERD (gastroesophageal reflux disease)   . Kidney abscess 08/2014  . Lupus   . Rheumatoid arthritis (HCC)   . Sciatica   . Shortness of breath dyspnea   . Swelling of joint, ankle, right 11/10/2014  . Synovial cyst     PAST SURGICAL HISTORY: Past Surgical History:  Procedure Laterality Date  . APPENDECTOMY    . HERNIA REPAIR    . kidney stent placement    . LUMBAR FUSION    . PERIPHERALLY INSERTED CENTRAL CATHETER INSERTION  08/2014  . TEE WITHOUT CARDIOVERSION N/A 08/25/2014   Procedure:  TRANSESOPHAGEAL ECHOCARDIOGRAM (TEE);  Surgeon: Thurmon Fair, MD;  Location: [facility] ENDOSCOPY;  Service: Cardiovascular;  Laterality: N/A;    FAMILY HISTORY: Family History  Problem Relation Age of Onset  . Lupus Maternal Uncle   . Hyperlipidemia Mother   . Hyperlipidemia Father     SOCIAL HISTORY:  Social History   Social History  . Marital status: Single    Spouse name: N/A  . Number of children: 0  . Years of education: HS   Occupational History  . Disabled    Social History Main Topics  . Smoking status: Current Every Day Smoker    Packs/day: 1.00    Years: 45.00    Types: Cigarettes    Last attempt to quit: 09/02/2014  . Smokeless tobacco: Never Used  . Alcohol use No  . Drug use: No  . Sexual activity: Yes   Other Topics Concern  . Not on file   Social History Narrative   Lives at home alone.   Right-handed.   Occasional caffeine use.     PHYSICAL EXAM   Vitals:   03/07/16 1140  BP: 129/78  Pulse: 72  Weight: 158 lb (71.7 kg)  Height: 5\' 2"  (1.575 m)    Not recorded      Body mass index is 28.9 kg/m.  PHYSICAL EXAMNIATION:  Gen: NAD, conversant, well nourised, obese, well groomed                     Cardiovascular: Regular rate rhythm, no peripheral edema, warm, nontender. Eyes: Conjunctivae clear without exudates or hemorrhage Neck: Supple, no carotid bruits. Pulmonary: Clear to auscultation bilaterally   NEUROLOGICAL EXAM:  MENTAL STATUS: Speech:    Speech is normal; fluent and spontaneous with normal comprehension.  Cognition:     Orientation to time, place and person     Normal recent and remote memory     Normal Attention span and concentration     Normal Language, naming, repeating,spontaneous speech     Fund of knowledge   CRANIAL NERVES: CN II: Visual fields are full to confrontation. Fundoscopic exam is normal with sharp discs and no vascular changes. Pupils are round equal and briskly reactive to light. CN III, IV, VI:  extraocular movement are normal. No ptosis. CN V: Facial sensation is intact to pinprick in all 3 divisions bilaterally. Corneal responses are intact.  CN VII: Face is symmetric with normal eye closure and smile. CN VIII: Hearing is normal to rubbing fingers CN IX, X: Palate elevates symmetrically. Phonation is normal. CN XI: Head turning and shoulder shrug are intact CN XII: Tongue is midline with normal movements and no atrophy.  MOTOR: There is no pronator drift of out-stretched arms. Muscle bulk and tone are normal. Muscle strength is normal with exception of mild left big toe extention weakness.  REFLEXES: Reflexes are 2+ and symmetric at  the biceps, triceps, knees, and ankles. Plantar responses are flexor.  SENSORY: Intact to light touch, pinprick, positional sensation and vibratory sensation are intact in fingers and toes.  COORDINATION: Rapid alternating movements and fine finger movements are intact. There is no dysmetria on finger-to-nose and heel-knee-shin.    GAIT/STANCE: Antalgic, she is able to stand on tiptoe and heels.   DIAGNOSTIC DATA (LABS, IMAGING, TESTING) - I reviewed patient records, labs, notes, testing and imaging myself where available.   ASSESSMENT AND PLAN  Deanna Ball is a 55 y.o. female   Left lumbar radiculopathy  MRI of lumbar  EMG nerve conduction study  Cymbalta 60 mg daily    Levert Feinstein, M.D. Ph.D.  [facility] [address], [address] [place], [place] [address] Ph: [phone] Fax: [phone]  CC: Zenovia Jordan, MD

## 2016-05-06 ENCOUNTER — Encounter: Payer: Medicare Other | Admitting: Neurology

## 2016-06-20 DIAGNOSIS — Z0000 Encounter for general adult medical examination without abnormal findings: Secondary | ICD-10-CM | POA: Diagnosis not present

## 2016-06-20 DIAGNOSIS — M0609 Rheumatoid arthritis without rheumatoid factor, multiple sites: Secondary | ICD-10-CM | POA: Diagnosis not present

## 2016-06-20 DIAGNOSIS — K219 Gastro-esophageal reflux disease without esophagitis: Secondary | ICD-10-CM | POA: Diagnosis not present

## 2016-06-20 DIAGNOSIS — M3219 Other organ or system involvement in systemic lupus erythematosus: Secondary | ICD-10-CM | POA: Diagnosis not present

## 2016-06-20 DIAGNOSIS — M545 Low back pain: Secondary | ICD-10-CM | POA: Diagnosis not present

## 2016-08-22 DIAGNOSIS — K219 Gastro-esophageal reflux disease without esophagitis: Secondary | ICD-10-CM | POA: Diagnosis not present

## 2016-08-22 DIAGNOSIS — M0609 Rheumatoid arthritis without rheumatoid factor, multiple sites: Secondary | ICD-10-CM | POA: Diagnosis not present

## 2016-08-22 DIAGNOSIS — Z6824 Body mass index (BMI) 24.0-24.9, adult: Secondary | ICD-10-CM | POA: Diagnosis not present

## 2016-08-22 DIAGNOSIS — M3219 Other organ or system involvement in systemic lupus erythematosus: Secondary | ICD-10-CM | POA: Diagnosis not present

## 2016-08-22 DIAGNOSIS — M545 Low back pain: Secondary | ICD-10-CM | POA: Diagnosis not present

## 2016-11-22 DIAGNOSIS — M3219 Other organ or system involvement in systemic lupus erythematosus: Secondary | ICD-10-CM | POA: Diagnosis not present

## 2016-11-22 DIAGNOSIS — M0609 Rheumatoid arthritis without rheumatoid factor, multiple sites: Secondary | ICD-10-CM | POA: Diagnosis not present

## 2016-11-22 DIAGNOSIS — K219 Gastro-esophageal reflux disease without esophagitis: Secondary | ICD-10-CM | POA: Diagnosis not present

## 2016-11-22 DIAGNOSIS — M545 Low back pain: Secondary | ICD-10-CM | POA: Diagnosis not present

## 2017-01-24 DIAGNOSIS — M3219 Other organ or system involvement in systemic lupus erythematosus: Secondary | ICD-10-CM | POA: Diagnosis not present

## 2017-01-24 DIAGNOSIS — K219 Gastro-esophageal reflux disease without esophagitis: Secondary | ICD-10-CM | POA: Diagnosis not present

## 2017-01-24 DIAGNOSIS — M545 Low back pain: Secondary | ICD-10-CM | POA: Diagnosis not present

## 2017-01-24 DIAGNOSIS — M0609 Rheumatoid arthritis without rheumatoid factor, multiple sites: Secondary | ICD-10-CM | POA: Diagnosis not present

## 2017-01-24 DIAGNOSIS — Z6824 Body mass index (BMI) 24.0-24.9, adult: Secondary | ICD-10-CM | POA: Diagnosis not present

## 2017-03-27 DIAGNOSIS — M0609 Rheumatoid arthritis without rheumatoid factor, multiple sites: Secondary | ICD-10-CM | POA: Diagnosis not present

## 2017-03-27 DIAGNOSIS — M545 Low back pain: Secondary | ICD-10-CM | POA: Diagnosis not present

## 2017-03-27 DIAGNOSIS — L03818 Cellulitis of other sites: Secondary | ICD-10-CM | POA: Diagnosis not present

## 2017-03-27 DIAGNOSIS — K219 Gastro-esophageal reflux disease without esophagitis: Secondary | ICD-10-CM | POA: Diagnosis not present

## 2017-03-27 DIAGNOSIS — Z0000 Encounter for general adult medical examination without abnormal findings: Secondary | ICD-10-CM | POA: Diagnosis not present

## 2017-03-27 DIAGNOSIS — Z1389 Encounter for screening for other disorder: Secondary | ICD-10-CM | POA: Diagnosis not present

## 2017-03-27 DIAGNOSIS — M3219 Other organ or system involvement in systemic lupus erythematosus: Secondary | ICD-10-CM | POA: Diagnosis not present

## 2017-05-30 DIAGNOSIS — K219 Gastro-esophageal reflux disease without esophagitis: Secondary | ICD-10-CM | POA: Diagnosis not present

## 2017-05-30 DIAGNOSIS — M3219 Other organ or system involvement in systemic lupus erythematosus: Secondary | ICD-10-CM | POA: Diagnosis not present

## 2017-05-30 DIAGNOSIS — M545 Low back pain: Secondary | ICD-10-CM | POA: Diagnosis not present

## 2017-05-30 DIAGNOSIS — M0609 Rheumatoid arthritis without rheumatoid factor, multiple sites: Secondary | ICD-10-CM | POA: Diagnosis not present

## 2017-05-30 DIAGNOSIS — M810 Age-related osteoporosis without current pathological fracture: Secondary | ICD-10-CM | POA: Diagnosis not present

## 2017-08-01 DIAGNOSIS — M545 Low back pain: Secondary | ICD-10-CM | POA: Diagnosis not present

## 2017-08-01 DIAGNOSIS — M0609 Rheumatoid arthritis without rheumatoid factor, multiple sites: Secondary | ICD-10-CM | POA: Diagnosis not present

## 2017-08-01 DIAGNOSIS — K219 Gastro-esophageal reflux disease without esophagitis: Secondary | ICD-10-CM | POA: Diagnosis not present

## 2017-08-01 DIAGNOSIS — M3219 Other organ or system involvement in systemic lupus erythematosus: Secondary | ICD-10-CM | POA: Diagnosis not present

## 2017-08-01 DIAGNOSIS — M810 Age-related osteoporosis without current pathological fracture: Secondary | ICD-10-CM | POA: Diagnosis not present

## 2017-10-09 DIAGNOSIS — M3219 Other organ or system involvement in systemic lupus erythematosus: Secondary | ICD-10-CM | POA: Diagnosis not present

## 2017-10-09 DIAGNOSIS — M0609 Rheumatoid arthritis without rheumatoid factor, multiple sites: Secondary | ICD-10-CM | POA: Diagnosis not present

## 2017-10-09 DIAGNOSIS — M810 Age-related osteoporosis without current pathological fracture: Secondary | ICD-10-CM | POA: Diagnosis not present

## 2017-10-09 DIAGNOSIS — M545 Low back pain: Secondary | ICD-10-CM | POA: Diagnosis not present

## 2017-10-09 DIAGNOSIS — K219 Gastro-esophageal reflux disease without esophagitis: Secondary | ICD-10-CM | POA: Diagnosis not present

## 2017-12-18 DIAGNOSIS — M3219 Other organ or system involvement in systemic lupus erythematosus: Secondary | ICD-10-CM | POA: Diagnosis not present

## 2017-12-18 DIAGNOSIS — M0609 Rheumatoid arthritis without rheumatoid factor, multiple sites: Secondary | ICD-10-CM | POA: Diagnosis not present

## 2017-12-18 DIAGNOSIS — M545 Low back pain: Secondary | ICD-10-CM | POA: Diagnosis not present

## 2017-12-18 DIAGNOSIS — M810 Age-related osteoporosis without current pathological fracture: Secondary | ICD-10-CM | POA: Diagnosis not present

## 2017-12-18 DIAGNOSIS — K219 Gastro-esophageal reflux disease without esophagitis: Secondary | ICD-10-CM | POA: Diagnosis not present

## 2018-03-20 DIAGNOSIS — K219 Gastro-esophageal reflux disease without esophagitis: Secondary | ICD-10-CM | POA: Diagnosis not present

## 2018-03-20 DIAGNOSIS — M0609 Rheumatoid arthritis without rheumatoid factor, multiple sites: Secondary | ICD-10-CM | POA: Diagnosis not present

## 2018-03-20 DIAGNOSIS — M545 Low back pain: Secondary | ICD-10-CM | POA: Diagnosis not present

## 2018-03-20 DIAGNOSIS — M810 Age-related osteoporosis without current pathological fracture: Secondary | ICD-10-CM | POA: Diagnosis not present

## 2018-05-29 DIAGNOSIS — M545 Low back pain: Secondary | ICD-10-CM | POA: Diagnosis not present

## 2018-05-29 DIAGNOSIS — M810 Age-related osteoporosis without current pathological fracture: Secondary | ICD-10-CM | POA: Diagnosis not present

## 2018-05-29 DIAGNOSIS — M0609 Rheumatoid arthritis without rheumatoid factor, multiple sites: Secondary | ICD-10-CM | POA: Diagnosis not present

## 2018-05-29 DIAGNOSIS — K219 Gastro-esophageal reflux disease without esophagitis: Secondary | ICD-10-CM | POA: Diagnosis not present

## 2018-08-30 DIAGNOSIS — K219 Gastro-esophageal reflux disease without esophagitis: Secondary | ICD-10-CM | POA: Diagnosis not present

## 2018-08-30 DIAGNOSIS — M0609 Rheumatoid arthritis without rheumatoid factor, multiple sites: Secondary | ICD-10-CM | POA: Diagnosis not present

## 2018-08-30 DIAGNOSIS — Z0000 Encounter for general adult medical examination without abnormal findings: Secondary | ICD-10-CM | POA: Diagnosis not present

## 2018-08-30 DIAGNOSIS — M810 Age-related osteoporosis without current pathological fracture: Secondary | ICD-10-CM | POA: Diagnosis not present

## 2018-08-30 DIAGNOSIS — M545 Low back pain: Secondary | ICD-10-CM | POA: Diagnosis not present

## 2018-08-30 DIAGNOSIS — Z1389 Encounter for screening for other disorder: Secondary | ICD-10-CM | POA: Diagnosis not present

## 2018-10-31 DIAGNOSIS — M0609 Rheumatoid arthritis without rheumatoid factor, multiple sites: Secondary | ICD-10-CM | POA: Diagnosis not present

## 2018-10-31 DIAGNOSIS — M810 Age-related osteoporosis without current pathological fracture: Secondary | ICD-10-CM | POA: Diagnosis not present

## 2018-10-31 DIAGNOSIS — Z0000 Encounter for general adult medical examination without abnormal findings: Secondary | ICD-10-CM | POA: Diagnosis not present

## 2018-10-31 DIAGNOSIS — M545 Low back pain: Secondary | ICD-10-CM | POA: Diagnosis not present

## 2018-10-31 DIAGNOSIS — K219 Gastro-esophageal reflux disease without esophagitis: Secondary | ICD-10-CM | POA: Diagnosis not present

## 2019-01-01 DIAGNOSIS — M0609 Rheumatoid arthritis without rheumatoid factor, multiple sites: Secondary | ICD-10-CM | POA: Diagnosis not present

## 2019-01-01 DIAGNOSIS — M545 Low back pain: Secondary | ICD-10-CM | POA: Diagnosis not present

## 2019-01-01 DIAGNOSIS — K219 Gastro-esophageal reflux disease without esophagitis: Secondary | ICD-10-CM | POA: Diagnosis not present

## 2019-01-01 DIAGNOSIS — M810 Age-related osteoporosis without current pathological fracture: Secondary | ICD-10-CM | POA: Diagnosis not present

## 2019-03-05 DIAGNOSIS — M0609 Rheumatoid arthritis without rheumatoid factor, multiple sites: Secondary | ICD-10-CM | POA: Diagnosis not present

## 2019-03-05 DIAGNOSIS — M810 Age-related osteoporosis without current pathological fracture: Secondary | ICD-10-CM | POA: Diagnosis not present

## 2019-03-05 DIAGNOSIS — M545 Low back pain: Secondary | ICD-10-CM | POA: Diagnosis not present

## 2019-03-05 DIAGNOSIS — K219 Gastro-esophageal reflux disease without esophagitis: Secondary | ICD-10-CM | POA: Diagnosis not present

## 2019-05-14 DIAGNOSIS — M810 Age-related osteoporosis without current pathological fracture: Secondary | ICD-10-CM | POA: Diagnosis not present

## 2019-05-14 DIAGNOSIS — K219 Gastro-esophageal reflux disease without esophagitis: Secondary | ICD-10-CM | POA: Diagnosis not present

## 2019-05-14 DIAGNOSIS — M0609 Rheumatoid arthritis without rheumatoid factor, multiple sites: Secondary | ICD-10-CM | POA: Diagnosis not present

## 2019-05-14 DIAGNOSIS — M545 Low back pain: Secondary | ICD-10-CM | POA: Diagnosis not present

## 2019-07-30 DIAGNOSIS — M0609 Rheumatoid arthritis without rheumatoid factor, multiple sites: Secondary | ICD-10-CM | POA: Diagnosis not present

## 2019-07-30 DIAGNOSIS — M810 Age-related osteoporosis without current pathological fracture: Secondary | ICD-10-CM | POA: Diagnosis not present

## 2019-07-30 DIAGNOSIS — K219 Gastro-esophageal reflux disease without esophagitis: Secondary | ICD-10-CM | POA: Diagnosis not present

## 2019-07-30 DIAGNOSIS — Z0000 Encounter for general adult medical examination without abnormal findings: Secondary | ICD-10-CM | POA: Diagnosis not present

## 2019-07-30 DIAGNOSIS — M545 Low back pain: Secondary | ICD-10-CM | POA: Diagnosis not present

## 2019-10-03 DIAGNOSIS — L932 Other local lupus erythematosus: Secondary | ICD-10-CM | POA: Diagnosis not present

## 2019-10-03 DIAGNOSIS — M545 Low back pain: Secondary | ICD-10-CM | POA: Diagnosis not present

## 2019-10-03 DIAGNOSIS — M0609 Rheumatoid arthritis without rheumatoid factor, multiple sites: Secondary | ICD-10-CM | POA: Diagnosis not present

## 2019-10-03 DIAGNOSIS — Z0000 Encounter for general adult medical examination without abnormal findings: Secondary | ICD-10-CM | POA: Diagnosis not present

## 2019-10-03 DIAGNOSIS — E7849 Other hyperlipidemia: Secondary | ICD-10-CM | POA: Diagnosis not present

## 2019-10-03 DIAGNOSIS — K219 Gastro-esophageal reflux disease without esophagitis: Secondary | ICD-10-CM | POA: Diagnosis not present

## 2019-10-03 DIAGNOSIS — M810 Age-related osteoporosis without current pathological fracture: Secondary | ICD-10-CM | POA: Diagnosis not present

## 2019-12-11 DIAGNOSIS — Z0000 Encounter for general adult medical examination without abnormal findings: Secondary | ICD-10-CM | POA: Diagnosis not present

## 2019-12-11 DIAGNOSIS — M0609 Rheumatoid arthritis without rheumatoid factor, multiple sites: Secondary | ICD-10-CM | POA: Diagnosis not present

## 2019-12-11 DIAGNOSIS — M810 Age-related osteoporosis without current pathological fracture: Secondary | ICD-10-CM | POA: Diagnosis not present

## 2019-12-11 DIAGNOSIS — K219 Gastro-esophageal reflux disease without esophagitis: Secondary | ICD-10-CM | POA: Diagnosis not present

## 2019-12-11 DIAGNOSIS — M545 Low back pain: Secondary | ICD-10-CM | POA: Diagnosis not present

## 2019-12-11 DIAGNOSIS — L932 Other local lupus erythematosus: Secondary | ICD-10-CM | POA: Diagnosis not present

## 2020-02-11 DIAGNOSIS — M545 Low back pain, unspecified: Secondary | ICD-10-CM | POA: Diagnosis not present

## 2020-02-11 DIAGNOSIS — L932 Other local lupus erythematosus: Secondary | ICD-10-CM | POA: Diagnosis not present

## 2020-02-11 DIAGNOSIS — Z79891 Long term (current) use of opiate analgesic: Secondary | ICD-10-CM | POA: Diagnosis not present

## 2020-02-11 DIAGNOSIS — M810 Age-related osteoporosis without current pathological fracture: Secondary | ICD-10-CM | POA: Diagnosis not present

## 2020-02-11 DIAGNOSIS — K219 Gastro-esophageal reflux disease without esophagitis: Secondary | ICD-10-CM | POA: Diagnosis not present

## 2020-02-11 DIAGNOSIS — M0609 Rheumatoid arthritis without rheumatoid factor, multiple sites: Secondary | ICD-10-CM | POA: Diagnosis not present
